# Patient Record
Sex: Female | Born: 1940 | Race: White | Hispanic: No | Marital: Married | Smoking: Never smoker
Health system: Southern US, Community
[De-identification: ages and names within clinical notes are randomized; demographics above are authoritative.]

## PROBLEM LIST (undated history)

## (undated) DIAGNOSIS — I639 Cerebral infarction, unspecified: Secondary | ICD-10-CM

## (undated) DIAGNOSIS — R519 Headache, unspecified: Secondary | ICD-10-CM

## (undated) DIAGNOSIS — I10 Essential (primary) hypertension: Secondary | ICD-10-CM

## (undated) DIAGNOSIS — C801 Malignant (primary) neoplasm, unspecified: Secondary | ICD-10-CM

## (undated) DIAGNOSIS — R569 Unspecified convulsions: Secondary | ICD-10-CM

## (undated) DIAGNOSIS — R51 Headache: Secondary | ICD-10-CM

## (undated) DIAGNOSIS — Z9889 Other specified postprocedural states: Secondary | ICD-10-CM

## (undated) HISTORY — DX: Cerebral infarction, unspecified: I63.9

## (undated) HISTORY — DX: Malignant (primary) neoplasm, unspecified: C80.1

## (undated) HISTORY — DX: Headache, unspecified: R51.9

## (undated) HISTORY — DX: Other specified postprocedural states: Z98.890

## (undated) HISTORY — DX: Headache: R51

## (undated) HISTORY — DX: Unspecified convulsions: R56.9

## (undated) HISTORY — DX: Essential (primary) hypertension: I10

---

## 1960-09-24 HISTORY — PX: OVARIAN CYST SURGERY: SHX726

## 2010-09-24 HISTORY — PX: BREAST SURGERY: SHX581

## 2016-07-05 ENCOUNTER — Encounter: Payer: Self-pay | Admitting: *Deleted

## 2016-07-24 ENCOUNTER — Encounter: Payer: Self-pay | Admitting: Diagnostic Neuroimaging

## 2016-07-24 ENCOUNTER — Ambulatory Visit (INDEPENDENT_AMBULATORY_CARE_PROVIDER_SITE_OTHER): Payer: Medicare Other | Admitting: Diagnostic Neuroimaging

## 2016-07-24 VITALS — BP 153/74 | HR 83 | Ht <= 58 in | Wt 194.0 lb

## 2016-07-24 DIAGNOSIS — R55 Syncope and collapse: Secondary | ICD-10-CM

## 2016-07-24 DIAGNOSIS — R269 Unspecified abnormalities of gait and mobility: Secondary | ICD-10-CM

## 2016-07-24 DIAGNOSIS — G40209 Localization-related (focal) (partial) symptomatic epilepsy and epileptic syndromes with complex partial seizures, not intractable, without status epilepticus: Secondary | ICD-10-CM

## 2016-07-24 DIAGNOSIS — R292 Abnormal reflex: Secondary | ICD-10-CM

## 2016-07-24 NOTE — Patient Instructions (Signed)
Thank you for coming to see [place] at [facility]. I hope we have been able to provide you high quality care today.  You may receive a patient satisfaction survey over the next few weeks. We would appreciate your feedback and comments so that we may continue to improve ourselves and the health of our patients.  - check MRI brain and cervical spine  - check EEG  - continue oxcarbazepine 174m daily + topiramate 261mat bedtime  - refer to cardiology for second opinion evaluation (unprovoked syncope)   ~~~~~~~~~~~~~~~~~~~~~~~~~~~~~~~~~~~~~~~~~~~~~~~~~~~~~~~~~~~~~~~~~  DR. PENUMALLI'S GUIDE TO HAPPY AND HEALTHY LIVING These are some of my general health and wellness recommendations. Some of them may apply to you better than others. Please use common sense as you try these suggestions and feel free to ask me any questions.   ACTIVITY/FITNESS Mental, social, emotional and physical stimulation are very important for brain and body health. Try learning a new activity (arts, music, language, sports, games).  Keep moving your body to the best of your abilities. You can do this at home, inside or outside, the park, community center, gym or anywhere you like. Consider a physical therapist or personal trainer to get started. Consider the app Sworkit. Fitness trackers such as smart-watches, smart-phones or Fitbits can help as well.   NUTRITION Eat more plants: colorful vegetables, nuts, seeds and berries.  Eat less sugar, salt, preservatives and processed foods.  Avoid toxins such as cigarettes and alcohol.  Drink water when you are thirsty. Warm water with a slice of lemon is an excellent morning drink to start the day.  Consider these websites for more information The Nutrition Source (hthttps://www.henry-hernandez.biz/Precision Nutrition (wwWindowBlog.ch  RELAXATION Consider practicing mindfulness meditation or other relaxation  techniques such as deep breathing, prayer, yoga, tai chi, massage. See website mindful.org or the apps Headspace or Calm to help get started.   SLEEP Try to get at least 7-8+ hours sleep per day. Regular exercise and reduced caffeine will help you sleep better. Practice good sleep hygeine techniques. See website sleep.org for more information.   PLANNING Prepare estate planning, living will, healthcare POA documents. Sometimes this is best planned with the help of an attorney. Theconversationproject.org and agingwithdignity.org are excellent resources.

## 2016-07-24 NOTE — Progress Notes (Signed)
[facility]  PATIENT: Savannah Travis DOB: 21-Nov-1940  REFERRING CLINICIAN: Keane Police HISTORY FROM: patient, husband, daughter  REASON FOR VISIT: new consult    HISTORICAL  CHIEF COMPLAINT:  Chief Complaint  Patient presents with  . Syncope and collapse    rm 7, New Pt, husbandJeneen Rinks, dgtr- Cindy, "LOC w/fall Aug, headaches began in 2015 w/fall"    HISTORY OF PRESENT ILLNESS:   75 year old right-handed female here for evaluation of constellation of symptoms including head pain, squeezing pressure headaches, passing out spells. History of migraine, depression, anxiety, hypertension, hypercholesteremia, breast cancer.  In January 2015 patient had episode of falling backwards, hitting her head and passing out. Unclear if patient passed out and fell down or she tripped, fell and then passed out. Patient woke up with pain in the back of her head. She did not seek medical attention.  Since that time patient has had over 50 episodes of unprovoked syncope and passing out. Sometimes these are proceeded by patient making the sound "oh oh". Patient has had episodes of staring spells as well. With some of these episodes patient had witnessed/documented low heart rate in the 40s. Blood pressure during these attacks when it had been checked was normal to slightly elevated.   Patient has had evaluation by PCP, hospital admission in September 2017, cardiology consultation, without specific etiology found. Patient had metoprolol medication at the time which was discontinued as patient has some episodes of bradycardia. Patient had neurology evaluation and has been treated empirically for seizures with oxcarbazepine. Since that time no further syncope or seizure attacks.  In addition patient has intermittent "zinger" pain in her head lasting for 1 second at a time every other day. Sometimes this happens once every 2 weeks. Patient was diagnosed with possible occipital neuralgia,  treated with occipital nerve block without relief. Patient was also tried on gabapentin without relief.  Patient also has intermittent "gripping pressure" squeezing pain and pressure in her head lasting 2-3 minutes at a time, 4 days out of the week, 2-3 times per day.  Patient has history of headaches since teenage years consisting of intense global severe pain associated with menstrual cycle. Sometimes stress or exams would also seem to trigger headaches. Patient took over-the-counter medications. No nausea, vomiting, sensitivity to light or sound.   REVIEW OF SYSTEMS: Full 14 system review of systems performed and negative with exception of:  Shortness of breath incontinence joint pain feeling cold headache weakness passing out sleepiness depression anxiety to much sleep decreased energy disinterest in activities racing thoughts.  ALLERGIES: Allergies  Allergen Reactions  . Demerol [Meperidine] Nausea And Vomiting  . Integra [Fe Fum-Fepoly-Vit C-Vit B3] Diarrhea  . Magnesium Oxide Diarrhea  . Metformin And Related Diarrhea  . Sulfur Swelling    rash    HOME MEDICATIONS: No outpatient prescriptions prior to visit.   No facility-administered medications prior to visit.     PAST MEDICAL HISTORY: Past Medical History:  Diagnosis Date  . Cancer (HCC)    breast  . Headache    migraines  . Hypertension   . S/P wrist surgery 2015, 2017   for fx    PAST SURGICAL HISTORY: Past Surgical History:  Procedure Laterality Date  . BREAST SURGERY Right 2012   lumps removed  . OVARIAN CYST SURGERY  1962    FAMILY HISTORY: Family History  Problem Relation Age of Onset  . Cancer Sister     SOCIAL HISTORY:  Social History   Social History  .  Marital status: Married    Spouse name: Jeneen Rinks  . Number of children: 2  . Years of education: 12   Occupational History  .      [employer], retired   Social History Main Topics  . Smoking status: Never Smoker  . Smokeless  tobacco: Never Used  . Alcohol use No  . Drug use: No  . Sexual activity: Not on file   Other Topics Concern  . Not on file   Social History Narrative   Lives with husband   Caffeine - coffee, 1 cup daily     PHYSICAL EXAM  GENERAL EXAM/CONSTITUTIONAL: Vitals:  Vitals:   07/24/16 1448  BP: (!) 153/74  Pulse: 83  Weight: 194 lb (88 kg)  Height: 4\' 8"  (1.422 m)     Body mass index is 43.49 kg/m.  No exam data present  Patient is in no distress; well developed, nourished and groomed; neck is supple  CARDIOVASCULAR:  Examination of carotid arteries is normal; no carotid bruits  Regular rate and rhythm, no murmurs  Examination of peripheral vascular system by observation and palpation is normal  EYES:  Ophthalmoscopic exam of optic discs and posterior segments is normal; no papilledema or hemorrhages  MUSCULOSKELETAL:  Gait, strength, tone, movements noted in Neurologic exam below  NEUROLOGIC: MENTAL STATUS:  No flowsheet data found.  awake, alert, oriented to person, place and time  recent and remote memory intact  normal attention and concentration  language fluent, comprehension intact, naming intact,   fund of knowledge appropriate  CRANIAL NERVE:   2nd - no papilledema on fundoscopic exam  2nd, 3rd, 4th, 6th - pupils equal and reactive to light, visual fields full to confrontation, extraocular muscles intact, no nystagmus  5th - facial sensation symmetric  7th - facial strength symmetric  8th - hearing intact  9th - palate elevates symmetrically, uvula midline  11th - shoulder shrug symmetric  12th - tongue protrusion midline  MOTOR:   normal bulk and tone, full strength in the BUE, BLE  SENSORY:   normal and symmetric to light touch, temperature, vibration  COORDINATION:   finger-nose-finger, fine finger movements normal  REFLEXES:   deep tendon reflexes BRISK IN UPPER EXT AND KNEES and symmetric  GAIT/STATION:    narrow based gait; SPASTIC GAIT; UNSTEADY     DIAGNOSTIC DATA (LABS, IMAGING, TESTING) - I reviewed patient records, labs, notes, testing and imaging myself where available.  No results found for: WBC, HGB, HCT, MCV, PLT No results found for: NA, K, CL, CO2, GLUCOSE, BUN, CREATININE, CALCIUM, PROT, ALBUMIN, AST, ALT, ALKPHOS, BILITOT, GFRNONAA, GFRAA No results found for: CHOL, HDL, LDLCALC, LDLDIRECT, TRIG, CHOLHDL No results found for: HGBA1C No results found for: VITAMINB12 No results found for: TSH   06/16/14 MRI brain - moderate-severe chronic small vessel ischemic disease - mild perisylvian atrophy - no acute findings  06/05/16 EEG - normal EEG in awake and drowsy states    ASSESSMENT AND PLAN  75 y.o. year old female here with  multiple episodes of recurrent unprovoked passing out attacks (> 50 since 2015) with syncope and seizure features. Considerations would include beta blocker induced bradycardia and syncopal attacks. Other consideration would include complex partial seizures. The multiple attacks, stereotyped, sometimes preceded by a moan or scream, sometimes preceded by staring spells are highly suspicious for complex partial seizures. We'll complete workup with MRI brain and EEG.  Patient's gait and balance difficulties may be due to underlying cervical myelopathy and  we will check MRI cervical spine for further evaluation.  Patient also has long-standing history of probable migraine headaches. 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   Ddx: syncope (cardiogenic, dehydration, UTI) vs seizure (complex partial)  1. Syncope and collapse   2. Partial symptomatic epilepsy with complex partial seizures, not intractable, without status epilepticus (HCC)   3. Gait difficulty   4. Hyperreflexia      PLAN: - check MRI brain and cervical spine - check EEG - continue oxcarbazepine 150mg  daily + topiramate 25mg  at  bedtime - refer to cardiology for second opinion evaluation (unprovoked syncope)  Orders Placed This Encounter  Procedures  . MR BRAIN W WO CONTRAST  . MR CERVICAL SPINE WO CONTRAST  . EEG adult   Return in about 2 months (around 09/23/2016).    Penni Bombard, MD [id number], [id number] PM Certified in Neurology, Neurophysiology and Neuroimaging  [facility] [address], [place], [place] [address] [phone]

## 2016-07-25 ENCOUNTER — Ambulatory Visit (INDEPENDENT_AMBULATORY_CARE_PROVIDER_SITE_OTHER): Payer: Medicare Other | Admitting: Diagnostic Neuroimaging

## 2016-07-25 DIAGNOSIS — G40209 Localization-related (focal) (partial) symptomatic epilepsy and epileptic syndromes with complex partial seizures, not intractable, without status epilepticus: Secondary | ICD-10-CM

## 2016-07-25 DIAGNOSIS — R292 Abnormal reflex: Secondary | ICD-10-CM

## 2016-07-25 DIAGNOSIS — R55 Syncope and collapse: Secondary | ICD-10-CM | POA: Diagnosis not present

## 2016-07-25 DIAGNOSIS — R269 Unspecified abnormalities of gait and mobility: Secondary | ICD-10-CM

## 2016-07-25 NOTE — Procedures (Signed)
   [facility]  EEG (ELECTROENCEPHALOGRAM) REPORT   STUDY DATE: 07/25/16 PATIENT NAME: Savannah Travis DOB: June 03, 1941 MRN: [id number]  ORDERING CLINICIAN: Andrey Spearman, MD   TECHNOLOGIST: Oneita Jolly TECHNIQUE: Electroencephalogram was recorded utilizing standard 10-20 system of lead placement and reformatted into average and bipolar montages.  RECORDING TIME: 22 minutes ACTIVATION: hyperventilation and photic stimulation  CLINICAL INFORMATION: 75 year old female with syncope.  FINDINGS: Background rhythms of 11-12 hertz and 50-60 microvolts. No focal, lateralizing, epileptiform activity or seizures are seen. Patient recorded in the awake and drowsy state. EKG channel shows regular rhythm of 80 beats per minute.  IMPRESSION:  Normal EEG in the awake and drowsy states.     INTERPRETING PHYSICIAN:  Penni Bombard, MD Certified in Neurology, Neurophysiology and Neuroimaging  [facility] [address] [place], [place] [address] [phone]

## 2016-08-02 ENCOUNTER — Encounter: Payer: Self-pay | Admitting: Physician Assistant

## 2016-08-02 ENCOUNTER — Ambulatory Visit (INDEPENDENT_AMBULATORY_CARE_PROVIDER_SITE_OTHER): Payer: Medicare Other | Admitting: Physician Assistant

## 2016-08-02 VITALS — BP 120/80 | HR 90 | Ht <= 58 in | Wt 195.8 lb

## 2016-08-02 DIAGNOSIS — R0602 Shortness of breath: Secondary | ICD-10-CM | POA: Diagnosis not present

## 2016-08-02 DIAGNOSIS — R55 Syncope and collapse: Secondary | ICD-10-CM | POA: Diagnosis not present

## 2016-08-02 DIAGNOSIS — I5032 Chronic diastolic (congestive) heart failure: Secondary | ICD-10-CM

## 2016-08-02 DIAGNOSIS — I10 Essential (primary) hypertension: Secondary | ICD-10-CM | POA: Diagnosis not present

## 2016-08-02 DIAGNOSIS — E785 Hyperlipidemia, unspecified: Secondary | ICD-10-CM

## 2016-08-02 NOTE — Progress Notes (Addendum)
Cardiology Office Note    Date:  08/02/2016   ID:  [name], DOB 02-07-1941, MRN [id number]  PCP:  Keane Police, MD  Cardiologist:  New  Chief Complaint: Syncope  History of Present Illness:   Savannah Travis is a 75 y.o. female with hx of HTN, HLD, breast cancer and recurrent syncope who was recently seen by Dr. Leta Baptist for syncope and sent her for further evaluation.   He has long standing hx of syncope and collapse dating back to 2015 when she fall back ward and hitting her head. Did not seek medical attention. Over 50 episode since then.   Last episode occurred in Sep, 2017. Documented bradycardia to 40s on metoprolol. Echo was normal (no records available). She was evaluated once by Dr. Berline Lopes @ [place], [place] once after dischage. 30 days event monitor showed sinus rhythm. No arrhythmias.  She was also followed by Dr. Alroy Dust in past for high blood pressure. Stress test was normal many years ago.   The patient was recently evaluated by Dr. Leta Baptist 07/24/16. Pending MRI of brain and cervical spine (due to balance difficulty). Has normal EEG 07/25/16.   She is here for further evaluation and wants to establish care here. No reoccurrence of syncope since discontinuation of metoprolol. Her main complain is dyspnea on exertion that has been getting worse long with fatigue and tiredness.  Intermittent LE edema, abdominal tightness, and orthopnea. Denies palpitation, melena, or snoring. No hx of tobacco smoking to alcohol drinking. Family hx high homocystine level however she is tested negative. Father had MI and stroke at age 9.   Past Medical History:  Diagnosis Date  . Cancer (HCC)    breast  . Headache    migraines  . Hypertension   . S/P wrist surgery 2015, 2017   for fx    Past Surgical History:  Procedure Laterality Date  . BREAST SURGERY Right 2012   lumps removed  . OVARIAN CYST SURGERY  1962    Current Medications: Prior to Admission medications     Medication Sig Start Date End Date Taking? Authorizing Provider  allopurinol (ZYLOPRIM) 300 MG tablet 300 mg. 06/25/16   Historical Provider, MD  amLODipine (NORVASC) 5 MG tablet 5 mg. 06/29/16   Historical Provider, MD  aspirin 81 MG chewable tablet Chew by mouth daily.    Historical Provider, MD  atorvastatin (LIPITOR) 40 MG tablet 40 mg. 06/25/16   Historical Provider, MD  butalbital-acetaminophen-caffeine (FIORICET WITH CODEINE) 50-325-40-30 MG capsule Take 1 capsule by mouth every 4 (four) hours as needed for headache.    Historical Provider, MD  Cholecalciferol (VITAMIN D3) 5000 units TABS Take by mouth.    Historical Provider, MD  clonazePAM (KLONOPIN) 1 MG tablet 1 mg. 06/30/16   Historical Provider, MD  co-enzyme Q-10 30 MG capsule Take 30 mg by mouth 3 (three) times daily.    Historical Provider, MD  denosumab (PROLIA) 60 MG/ML SOLN injection Inject 60 mg into the skin every 6 (six) months. Administer in upper arm, thigh, or abdomen    Historical Provider, MD  escitalopram (LEXAPRO) 10 MG tablet 10 mg. 06/25/16   Historical Provider, MD  Glucos-Chond-Hyal Ac-Ca Fructo (MOVE FREE JOINT HEALTH ADVANCE PO) Take by mouth.    Historical Provider, MD  maprotiline (LUDIOMIL) 50 MG tablet Take 50 mg by mouth at bedtime.    Historical Provider, MD  meclizine (ANTIVERT) 12.5 MG tablet 12.5 mg. 05/30/16   Historical Provider, MD  montelukast (SINGULAIR) 10 MG tablet Take 10  mg by mouth at bedtime.    Historical Provider, MD  omeprazole (PRILOSEC) 40 MG capsule Take 40 mg by mouth daily.    Historical Provider, MD  Oxcarbazepine (TRILEPTAL) 300 MG tablet 150 mg. 06/21/16   Historical Provider, MD  Pancrelipase, Lip-Prot-Amyl, 5000 units CPEP Take by mouth.    Historical Provider, MD  topiramate (TOPAMAX) 25 MG tablet 25 mg. 06/25/16   Historical Provider, MD  [place] Name: Calcium 25 mg    Historical Provider, MD  [facility] Name: chlordiazepoxise 5 mg    Historical Provider, MD     Allergies:   Demerol [meperidine]; Integra [fe fum-fepoly-vit c-vit b3]; Magnesium oxide; Metformin and related; and Sulfur   Social History   Social History  . Marital status: Married    Spouse name: Jeneen Rinks  . Number of children: 2  . Years of education: 12   Occupational History  .      [employer], retired   Social History Main Topics  . Smoking status: Never Smoker  . Smokeless tobacco: Never Used  . Alcohol use No  . Drug use: No  . Sexual activity: Not Asked   Other Topics Concern  . None   Social History Narrative   Lives with husband   Caffeine - coffee, 1 cup daily     Family History:  The patient's family history includes Cancer in her sister.   ROS:   Please see the history of present illness.    ROS All other systems reviewed and are negative.   PHYSICAL EXAM:   VS:  BP 120/80 (BP Location: Right Arm, Cuff Size: Large)   Pulse 90   Ht 4\' 8"  (1.422 m)   Wt 195 lb 12.8 oz (88.8 kg)   LMP  (LMP Unknown)   BMI 43.90 kg/m    GEN: Well nourished, well developed, in no acute distress  HEENT: normal  Neck:  + JVD, carotid bruits, or masses Cardiac: RRR; no murmurs, rubs, or gallops. Trace BL LE edema edema  Respiratory:  clear to auscultation bilaterally, normal work of breathing GI: soft, nontender, nondistended, + BS MS: no deformity or atrophy  Skin: warm and dry, no rash Neuro:  Alert and Oriented x 3, Strength and sensation are intact Psych: euthymic mood, full affect  Wt Readings from Last 3 Encounters:  08/02/16 195 lb 12.8 oz (88.8 kg)  07/24/16 194 lb (88 kg)      Studies/Labs Reviewed:   EKG:  EKG is not  ordered today.  EKG 06/19/16 NSR at rate of 84 bpm.   Recent Labs: No results found for requested labs within last 8760 hours.   Lipid Panel No results found for: CHOL, TRIG, HDL, CHOLHDL, VLDL, LDLCALC, LDLDIRECT  Additional studies/ records that were reviewed today include:   Echocardiogram: 06/04/16 at [facility] LV ef of over 60%, grade 2 DD, normal RV function, normal valve function.   Echocardiogram: 02/09/15  at [facility] LV EF of 60-65%, normal DD, mild LA dilation   ASSESSMENT & PLAN:    1. Syncope  - No reoccurrence since stopped BB. Avoid BB as hx of documented bradycardia. 30 monitor was normal. Continue to monitor.   2. Dyspnea on exertion - This has been getting worse. Sign of mild volume overload.  She has been recently tired and fatigue as well. Will get BNP. If normal, will get Lexiscan to r/o ischemic etiology.   3. Chronic  diastolic CHF - Last echo [id number] at [facility] showed LV ef of over 60%, grade 2 DD, normal RV function, normal valve function. As above. She also has mild BL LE edema and she is on amlodipine. Depending on BNP level will determine medical therapy.   4. HTN - R arm 120/80 and L arm 144/80. No chest pain. Will do carotid doppler to r/o subclavian steal and carotid stenosis.   5. HLD - followed by PCP. Continue statin.   Discussed with DOD Dr. Curt Bears and he aggred to plan.   Medication Adjustments/Labs and Tests Ordered: Current medicines are reviewed at length with the patient today.  Concerns regarding medicines are outlined above.  Medication changes, Labs and Tests ordered today are listed in the Patient Instructions below. Patient Instructions  Your physician recommends that you continue on your current medications as directed. Please refer to the Current Medication list given to you today.   Your physician recommends that you return for lab work in:  [place] has requested that you have a carotid duplex. This test is an ultrasound of the carotid arteries in your neck. It looks at blood flow through these arteries that supply the brain with blood. Allow one hour for this exam. There are no restrictions or special instructions.  Your physician recommends that you schedule a  follow-up appointment in:  2 WEEKS  WITH  VIN    Jarrett Soho, PA  08/02/2016 3:31 PM    [address], [place], [place]  [address] Phone: [phone]; Fax: [phone]

## 2016-08-02 NOTE — Patient Instructions (Addendum)
Your physician recommends that you continue on your current medications as directed. Please refer to the Current Medication list given to you today.   Your physician recommends that you return for lab work in:  [place] has requested that you have a carotid duplex. This test is an ultrasound of the carotid arteries in your neck. It looks at blood flow through these arteries that supply the brain with blood. Allow one hour for this exam. There are no restrictions or special instructions.  Your physician recommends that you schedule a follow-up appointment in:  2 WEEKS  WITH  VIN

## 2016-08-03 LAB — BRAIN NATRIURETIC PEPTIDE: BRAIN NATRIURETIC PEPTIDE: 32.4 pg/mL (ref <100)

## 2016-08-07 ENCOUNTER — Telehealth: Payer: Self-pay | Admitting: Physician Assistant

## 2016-08-07 DIAGNOSIS — R0602 Shortness of breath: Secondary | ICD-10-CM

## 2016-08-07 NOTE — Telephone Encounter (Signed)
-----   Message from [place], [place] sent at 08/03/2016  2:00 PM EST ----- BNP normal. Likely her s/s is not from heart failure. Will get Lexiscan.

## 2016-08-07 NOTE — Telephone Encounter (Signed)
Follow Up:; ° ° °Returning your call. °

## 2016-08-07 NOTE — Telephone Encounter (Signed)
Pt aware of her lab rersults She has been advised that we will order lexiscan and someone will call her with the appt.  Pt was verbally given instructions over the phone.  Pt agreeable with this and verbalized understanding.

## 2016-08-08 ENCOUNTER — Ambulatory Visit
Admission: RE | Admit: 2016-08-08 | Discharge: 2016-08-08 | Disposition: A | Discharge status: Home / Self Care | Payer: Medicare Other | Source: Ambulatory Visit | Attending: Diagnostic Neuroimaging | Admitting: Diagnostic Neuroimaging

## 2016-08-08 ENCOUNTER — Ambulatory Visit (HOSPITAL_COMMUNITY)
Admission: RE | Admit: 2016-08-08 | Discharge: 2016-08-08 | Disposition: A | Discharge status: Home / Self Care | Payer: Medicare Other | Source: Ambulatory Visit | Attending: Cardiology | Admitting: Cardiology

## 2016-08-08 DIAGNOSIS — G40209 Localization-related (focal) (partial) symptomatic epilepsy and epileptic syndromes with complex partial seizures, not intractable, without status epilepticus: Secondary | ICD-10-CM

## 2016-08-08 DIAGNOSIS — R292 Abnormal reflex: Secondary | ICD-10-CM

## 2016-08-08 DIAGNOSIS — R55 Syncope and collapse: Secondary | ICD-10-CM

## 2016-08-08 DIAGNOSIS — R269 Unspecified abnormalities of gait and mobility: Secondary | ICD-10-CM

## 2016-08-08 MED ORDER — GADOBENATE DIMEGLUMINE 529 MG/ML IV SOLN
20.0000 mL | Freq: Once | INTRAVENOUS | Status: AC | PRN
  Administered 2016-08-08: 18 mL via INTRAVENOUS

## 2016-08-10 ENCOUNTER — Telehealth: Payer: Self-pay | Admitting: Physician Assistant

## 2016-08-10 NOTE — Telephone Encounter (Signed)
Returned pts call and discussed her Vas US Carotid results.  Pt verbalized understanding.

## 2016-08-10 NOTE — Telephone Encounter (Signed)
Returned pts call..lmptcb jw 08-10-16

## 2016-08-10 NOTE — Telephone Encounter (Signed)
Follow up    Pt verbalized that he is returning call

## 2016-08-10 NOTE — Telephone Encounter (Signed)
Follow Up:; ° ° °Returning your call. °

## 2016-08-23 ENCOUNTER — Ambulatory Visit: Payer: Medicare Other | Admitting: Physician Assistant

## 2016-08-24 DIAGNOSIS — I639 Cerebral infarction, unspecified: Secondary | ICD-10-CM

## 2016-08-24 HISTORY — DX: Cerebral infarction, unspecified: I63.9

## 2016-08-29 ENCOUNTER — Telehealth: Payer: Self-pay | Admitting: *Deleted

## 2016-08-29 NOTE — Telephone Encounter (Signed)
Spoke with patient and informed her that this RN needs to reschedule her follow up due to provider being out of the office. Rescheduled for 10/15/16, advised she arrive 15 min early. She verbalized understanding.

## 2016-08-30 NOTE — Progress Notes (Deleted)
Cardiology Office Note    Date:  08/30/2016   ID:  Savannah Travis, DOB 1941/03/20, MRN [id number]  PCP:  Keane Police, MD  Cardiologist:  Dr. Curt Bears or Dr.End   Chief Complaint: syncope follow up  History of Present Illness:   Savannah Travis is a 75 y.o. female with hx of HTN, HLD, breast cancer and recurrent syncope (resolved since off BB) who presented for follow up.   He has long standing hx of syncope and collapse dating back to 2015 when she fall back ward and hitting her head. Last episode occurred in Sep, 2017. Documented bradycardia to 40s on metoprolol. No reoccurrence since discontinued.   She was evaluated once by Dr. Berline Lopes @ [place], [place] once sept-oct 2017. 30 days event monitor showed sinus rhythm. No arrhythmias.  She was also followed by Dr. Alroy Dust in past for high blood pressure.   Seen by me and Dr. Curt Bears (DOD) 08/02/16 after referral by by Dr. Leta Baptist. She also complains of dyspnea on exertion and LE edema. BNP was normal. Still pending stress test. Normal carotid doppler.   Here today for follow up.     Past Medical History:  Diagnosis Date  . Cancer (HCC)    breast  . Headache    migraines  . Hypertension   . S/P wrist surgery 2015, 2017   for fx    Past Surgical History:  Procedure Laterality Date  . BREAST SURGERY Right 2012   lumps removed  . OVARIAN CYST SURGERY  1962    Current Medications: Prior to Admission medications   Medication Sig Start Date End Date Taking? Authorizing Provider  allopurinol (ZYLOPRIM) 300 MG tablet Take 300 mg by mouth daily.  06/25/16   Historical Provider, MD  amLODipine (NORVASC) 5 MG tablet Take 5 mg by mouth daily.  06/29/16   Historical Provider, MD  aspirin 81 MG chewable tablet Chew 81 mg by mouth daily.     Historical Provider, MD  atorvastatin (LIPITOR) 40 MG tablet Take 40 mg by mouth daily at 6 PM.  06/25/16   Historical Provider, MD  butalbital-acetaminophen-caffeine (FIORICET WITH CODEINE)  50-325-40-30 MG capsule Take 1 capsule by mouth every 4 (four) hours as needed for headache.    Historical Provider, MD  Cholecalciferol (VITAMIN D3) 5000 units TABS Take 1 tablet by mouth daily.     Historical Provider, MD  clonazePAM (KLONOPIN) 1 MG tablet Take 1 mg by mouth 2 (two) times daily as needed.  06/30/16   Historical Provider, MD  co-enzyme Q-10 30 MG capsule Take 30 mg by mouth 3 (three) times daily.    Historical Provider, MD  denosumab (PROLIA) 60 MG/ML SOLN injection Inject 60 mg into the skin every 6 (six) months. Administer in upper arm, thigh, or abdomen    Historical Provider, MD  escitalopram (LEXAPRO) 10 MG tablet Take 10 mg by mouth daily.  06/25/16   Historical Provider, MD  Glucos-Chond-Hyal Ac-Ca Fructo (MOVE FREE JOINT HEALTH ADVANCE PO) Take 1 tablet by mouth daily.     Historical Provider, MD  maprotiline (LUDIOMIL) 50 MG tablet Take 50 mg by mouth at bedtime.    Historical Provider, MD  meclizine (ANTIVERT) 12.5 MG tablet Take 12.5 mg by mouth 2 (two) times daily as needed.  05/30/16   Historical Provider, MD  montelukast (SINGULAIR) 10 MG tablet Take 10 mg by mouth at bedtime.    Historical Provider, MD  MYRBETRIQ 50 MG TB24 tablet Take 50 mg by mouth daily. 07/31/16  Historical Provider, MD  omeprazole (PRILOSEC) 40 MG capsule Take 40 mg by mouth daily.    Historical Provider, MD  Oxcarbazepine (TRILEPTAL) 300 MG tablet Take 150 mg by mouth daily.  06/21/16   Historical Provider, MD  Pancrelipase, Lip-Prot-Amyl, 5000 units CPEP Take by mouth.    Historical Provider, MD  topiramate (TOPAMAX) 25 MG tablet Take 25 mg by mouth daily.  06/25/16   Historical Provider, MD  UNABLE TO FIND Med Name: Calcium 25 mg    Historical Provider, MD  UNABLE TO FIND Med Name: chlordiazepoxise 5 mg    Historical Provider, MD    Allergies:   Demerol [meperidine]; Integra [fe fum-fepoly-vit c-vit b3]; Magnesium oxide; Metformin and related; and Sulfur   Social History   Social History  .  Marital status: Married    Spouse name: Jeneen Rinks  . Number of children: 2  . Years of education: 12   Occupational History  .      [employer], retired   Social History Main Topics  . Smoking status: Never Smoker  . Smokeless tobacco: Never Used  . Alcohol use No  . Drug use: No  . Sexual activity: Not on file   Other Topics Concern  . Not on file   Social History Narrative   Lives with husband   Caffeine - coffee, 1 cup daily     Family History:  The patient's family history includes Cancer in her sister. ***  ROS:   Please see the history of present illness.    ROS All other systems reviewed and are negative.   PHYSICAL EXAM:   VS:  LMP  (LMP Unknown)    GEN: Well nourished, well developed, in no acute distress  HEENT: normal  Neck: no JVD, carotid bruits, or masses Cardiac: ***RRR; no murmurs, rubs, or gallops,no edema  Respiratory:  clear to auscultation bilaterally, normal work of breathing GI: soft, nontender, nondistended, + BS MS: no deformity or atrophy  Skin: warm and dry, no rash Neuro:  Alert and Oriented x 3, Strength and sensation are intact Psych: euthymic mood, full affect  Wt Readings from Last 3 Encounters:  08/02/16 195 lb 12.8 oz (88.8 kg)  07/24/16 194 lb (88 kg)      Studies/Labs Reviewed:   EKG:  EKG is ordered today.  The ekg ordered today demonstrates ***  Recent Labs: 08/02/2016: Brain Natriuretic Peptide 32.4   Lipid Panel No results found for: CHOL, TRIG, HDL, CHOLHDL, VLDL, LDLCALC, LDLDIRECT  Additional studies/ records that were reviewed today include:   Echocardiogram: 06/04/16 at [facility] LV ef of over 60%, grade 2 DD, normal RV function, normal valve function.   Echocardiogram: 02/09/15  at [facility] LV EF of 60-65%, normal DD, mild LA dilation    ASSESSMENT & PLAN:    1. ***    Medication Adjustments/Labs and Tests Ordered: Current medicines are reviewed at  length with the patient today.  Concerns regarding medicines are outlined above.  Medication changes, Labs and Tests ordered today are listed in the Patient Instructions below. There are no Patient Instructions on file for this visit.   Jarrett Soho, [place]  08/30/2016 11:54 AM    [facility] [place], [place], [place]  [address] Phone: [phone]; Fax: [phone]

## 2016-09-05 ENCOUNTER — Ambulatory Visit: Payer: Medicare Other | Admitting: Physician Assistant

## 2016-09-11 ENCOUNTER — Encounter (HOSPITAL_COMMUNITY): Payer: Self-pay | Admitting: *Deleted

## 2016-09-11 ENCOUNTER — Emergency Department (HOSPITAL_COMMUNITY)
Admission: EM | Admit: 2016-09-11 | Discharge: 2016-09-11 | Disposition: A | Discharge status: Home / Self Care | Payer: Medicare Other | Attending: Emergency Medicine | Admitting: Emergency Medicine

## 2016-09-11 ENCOUNTER — Emergency Department (HOSPITAL_COMMUNITY): Payer: Medicare Other

## 2016-09-11 ENCOUNTER — Telehealth (HOSPITAL_COMMUNITY): Payer: Self-pay | Admitting: *Deleted

## 2016-09-11 DIAGNOSIS — Z79899 Other long term (current) drug therapy: Secondary | ICD-10-CM | POA: Diagnosis not present

## 2016-09-11 DIAGNOSIS — R269 Unspecified abnormalities of gait and mobility: Secondary | ICD-10-CM | POA: Insufficient documentation

## 2016-09-11 DIAGNOSIS — Z7982 Long term (current) use of aspirin: Secondary | ICD-10-CM | POA: Diagnosis not present

## 2016-09-11 DIAGNOSIS — Z853 Personal history of malignant neoplasm of breast: Secondary | ICD-10-CM | POA: Diagnosis not present

## 2016-09-11 DIAGNOSIS — I10 Essential (primary) hypertension: Secondary | ICD-10-CM | POA: Insufficient documentation

## 2016-09-11 DIAGNOSIS — R531 Weakness: Secondary | ICD-10-CM

## 2016-09-11 LAB — URINALYSIS, ROUTINE W REFLEX MICROSCOPIC
Bilirubin Urine: NEGATIVE
GLUCOSE, UA: NEGATIVE mg/dL
HGB URINE DIPSTICK: NEGATIVE
KETONES UR: NEGATIVE mg/dL
Leukocytes, UA: NEGATIVE
Nitrite: NEGATIVE
PROTEIN: NEGATIVE mg/dL
Specific Gravity, Urine: 1.009 (ref 1.005–1.030)
pH: 6 (ref 5.0–8.0)

## 2016-09-11 LAB — CBC WITH DIFFERENTIAL/PLATELET
Basophils Absolute: 0 10*3/uL (ref 0.0–0.1)
Basophils Relative: 1 %
EOS PCT: 3 %
Eosinophils Absolute: 0.2 10*3/uL (ref 0.0–0.7)
HCT: 37.3 % (ref 36.0–46.0)
Hemoglobin: 12 g/dL (ref 12.0–15.0)
LYMPHS ABS: 1.8 10*3/uL (ref 0.7–4.0)
LYMPHS PCT: 28 %
MCH: 28.4 pg (ref 26.0–34.0)
MCHC: 32.2 g/dL (ref 30.0–36.0)
MCV: 88.2 fL (ref 78.0–100.0)
MONO ABS: 0.4 10*3/uL (ref 0.1–1.0)
Monocytes Relative: 6 %
Neutro Abs: 4.1 10*3/uL (ref 1.7–7.7)
Neutrophils Relative %: 62 %
PLATELETS: 277 10*3/uL (ref 150–400)
RBC: 4.23 MIL/uL (ref 3.87–5.11)
RDW: 14.7 % (ref 11.5–15.5)
WBC: 6.5 10*3/uL (ref 4.0–10.5)

## 2016-09-11 LAB — I-STAT TROPONIN, ED
Troponin i, poc: 0.01 ng/mL (ref 0.00–0.08)
Troponin i, poc: 0.01 ng/mL (ref 0.00–0.08)

## 2016-09-11 LAB — BASIC METABOLIC PANEL
Anion gap: 7 (ref 5–15)
BUN: 10 mg/dL (ref 6–20)
CALCIUM: 9.1 mg/dL (ref 8.9–10.3)
CO2: 21 mmol/L — ABNORMAL LOW (ref 22–32)
CREATININE: 0.99 mg/dL (ref 0.44–1.00)
Chloride: 113 mmol/L — ABNORMAL HIGH (ref 101–111)
GFR calc Af Amer: >60 mL/min (ref >60)
GFR, EST NON AFRICAN AMERICAN: 54 mL/min — AB (ref >60)
GLUCOSE: 99 mg/dL (ref 65–99)
Potassium: 3.1 mmol/L — ABNORMAL LOW (ref 3.5–5.1)
Sodium: 141 mmol/L (ref 135–145)

## 2016-09-11 MED ORDER — POTASSIUM CHLORIDE CRYS ER 20 MEQ PO TBCR
40.0000 meq | EXTENDED_RELEASE_TABLET | Freq: Once | ORAL | Status: AC
  Administered 2016-09-11: 40 meq via ORAL
  Filled 2016-09-11: qty 2

## 2016-09-11 NOTE — ED Notes (Signed)
Patient returned placed back on monitor. Family at bedside.

## 2016-09-11 NOTE — ED Notes (Signed)
Patient attempting to given urine sample.

## 2016-09-11 NOTE — ED Provider Notes (Signed)
[facility] DEPT Provider Note   CSN: [id number] Arrival date & time: 09/11/16  1009   History   Chief Complaint Chief Complaint  Patient presents with  . Gait Problem    HPI Savannah Travis is a 75 y.o. female.  Patient with PMH of multiple falls and syncopal episodes, HTN, HLD, and h/o breast cancer with generalized weakness that started 5 days prior to ED visit. Patient has history of fall with head injury in 2015 and has had >50 episodes of syncope and falls since then, but patient has apparently been improving in her mobility and functionality up until 5 days ago per family. Family describes her as being in a fog with less interaction unless prompted and slowed movements. She has had 2 soft falls into chair in last 24 hours due to weakness - patient denies associated chest pain, diaphoresis, headache, shortness of breath or other changes preceding the falls. She does endorse a central and left sided chest pain that does not radiate and otherwise patient cannot qualify the pain. She endorses a right sided headache that has been present for about one day. She denies focal weakness, numbness, tingling, vision or hearing changes.  She has been evaluated by neurology outpatient who think patient may be having seizures 2/2 trauma from prior fall and head injury. 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 Patient is also being workup up by cardiology outpatient; she was discontinued from metoprolol due to bradycardia; 30-day event monitor did not pick up arrhythmia and she has a stress test scheduled for 09/14/2016.      Past Medical History:  Diagnosis Date  . Cancer (HCC)    breast  . Headache    migraines  . Hypertension   . S/P wrist surgery 2015, 2017   for fx    There are no active problems to display for this patient.   Past Surgical History:  Procedure Laterality Date  .  BREAST SURGERY Right 2012   lumps removed  . OVARIAN CYST SURGERY  1962    OB History    No data available       Home Medications    Prior to Admission medications   Medication Sig Start Date End Date Taking? Authorizing Provider  allopurinol (ZYLOPRIM) 300 MG tablet Take 300 mg by mouth daily.  06/25/16  Yes Historical Provider, MD  amLODipine (NORVASC) 5 MG tablet Take 5 mg by mouth daily.  06/29/16  Yes Historical Provider, MD  aspirin 81 MG chewable tablet Chew 81 mg by mouth daily.    Yes Historical Provider, MD  atorvastatin (LIPITOR) 40 MG tablet Take 40 mg by mouth daily at 6 PM.  06/25/16  Yes Historical Provider, MD  calcium-vitamin D (CALCIUM 500/D) 500-200 MG-UNIT tablet Take 1 tablet by mouth daily with breakfast.   Yes Historical Provider, MD  Cholecalciferol (VITAMIN D3) 5000 units TABS Take 1 tablet by mouth daily.    Yes Historical Provider, MD  clidinium-chlordiazePOXIDE (LIBRAX) 5-2.5 MG capsule Take 1 capsule by mouth 4 (four) times daily -  before meals and at bedtime.   Yes Historical Provider, MD  clonazePAM (KLONOPIN) 1 MG tablet Take 1 mg by mouth 2 (two) times daily as needed.  06/30/16  Yes Historical Provider, MD  co-enzyme Q-10 30 MG capsule Take 30 mg by mouth 3 (three) times daily.   Yes Historical Provider, MD  escitalopram (LEXAPRO) 10 MG tablet  Take 10 mg by mouth daily.  06/25/16  Yes Historical Provider, MD  Glucos-Chond-Hyal Ac-Ca Fructo (MOVE FREE JOINT HEALTH ADVANCE PO) Take 1 tablet by mouth daily.    Yes Historical Provider, MD  maprotiline (LUDIOMIL) 50 MG tablet Take 50 mg by mouth at bedtime.   Yes Historical Provider, MD  meclizine (ANTIVERT) 12.5 MG tablet Take 12.5 mg by mouth 2 (two) times daily as needed.  05/30/16  Yes Historical Provider, MD  montelukast (SINGULAIR) 10 MG tablet Take 10 mg by mouth at bedtime.   Yes Historical Provider, MD  MYRBETRIQ 50 MG TB24 tablet Take 50 mg by mouth daily. 07/31/16  Yes Historical Provider, MD  omeprazole  (PRILOSEC) 40 MG capsule Take 40 mg by mouth daily.   Yes Historical Provider, MD  Oxcarbazepine (TRILEPTAL) 300 MG tablet Take 150 mg by mouth daily.  06/21/16  Yes Historical Provider, MD  butalbital-acetaminophen-caffeine (FIORICET WITH CODEINE) 50-325-40-30 MG capsule Take 1 capsule by mouth every 4 (four) hours as needed for headache.    Historical Provider, MD  denosumab (PROLIA) 60 MG/ML SOLN injection Inject 60 mg into the skin every 6 (six) months. Administer in upper arm, thigh, or abdomen    Historical Provider, MD  Pancrelipase, Lip-Prot-Amyl, 5000 units CPEP Take by mouth.    Historical Provider, MD  topiramate (TOPAMAX) 25 MG tablet Take 25 mg by mouth daily.  06/25/16   Historical Provider, MD    Family History Family History  Problem Relation Age of Onset  . Cancer Sister     Social History Social History  Substance Use Topics  . Smoking status: Never Smoker  . Smokeless tobacco: Never Used  . Alcohol use No     Allergies   Demerol [meperidine]; Integra [fe fum-fepoly-vit c-vit b3]; Magnesium oxide; Metformin and related; and Sulfur   Review of Systems Review of Systems  Constitutional: Positive for activity change and appetite change (but does take in PO when prompted). Negative for chills, diaphoresis and fever.  HENT: Negative for congestion, hearing loss and rhinorrhea.   Eyes: Negative for photophobia and visual disturbance.  Respiratory: Positive for cough (chronic and unchanged). Negative for chest tightness, shortness of breath and wheezing.   Cardiovascular: Positive for chest pain (mid and left sided). Negative for palpitations and leg swelling.  Gastrointestinal: Negative for abdominal distention, abdominal pain, blood in stool, constipation, diarrhea, nausea and vomiting.  Endocrine: Negative for polyuria.  Genitourinary: Positive for dysuria (intermittent). Negative for difficulty urinating, flank pain, frequency, hematuria and urgency.    Musculoskeletal: Positive for gait problem (feels legs can't bear her weight) and joint swelling (left knee). Negative for arthralgias.  Skin: Negative for rash and wound.  Neurological: Positive for headaches (right sided). Negative for dizziness, tremors, facial asymmetry, weakness, light-headedness and numbness.  Psychiatric/Behavioral: Positive for decreased concentration. Negative for agitation, behavioral problems and confusion. The patient is not nervous/anxious.      Physical Exam Updated Vital Signs BP 144/65   Pulse 83   Temp 98 F (36.7 C) (Oral)   Resp 16   LMP  (LMP Unknown)   SpO2 96%   Physical Exam  Constitutional: She is oriented to person, place, and time. She appears well-developed and well-nourished. No distress.  HENT:  Head: Normocephalic and atraumatic.  Mouth/Throat: Oropharynx is clear and moist.  Eyes: Conjunctivae and EOM are normal. Pupils are equal, round, and reactive to light. No scleral icterus.  Neck: Normal range of motion. Neck supple.  Cardiovascular: Normal rate, regular rhythm  and intact distal pulses.  Exam reveals no gallop and no friction rub.   No murmur heard. Pulmonary/Chest: Effort normal and breath sounds normal. No respiratory distress. She has no wheezes. She has no rales. She exhibits no tenderness.  Abdominal: Soft. Bowel sounds are normal. She exhibits no distension and no mass. There is no tenderness. There is no guarding.  Musculoskeletal: Normal range of motion. She exhibits tenderness (over left knee without appreciable effusion or focal tenderness). She exhibits no deformity. Edema: left knee swelling without erythema; strength and movement intact; tender to palpation both anterior and posterior knee.  Neurological: She is alert and oriented to person, place, and time. She has normal strength. She displays no tremor and normal reflexes. No cranial nerve deficit or sensory deficit. GCS eye subscore is 4. GCS verbal subscore is 5.  GCS motor subscore is 6.  Strength and movement intact however slow response  Skin: Skin is warm and dry. Capillary refill takes less than 2 seconds. No rash noted. She is not diaphoretic. No erythema.  Psychiatric: She has a normal mood and affect. Her behavior is normal. Judgment and thought content normal. Her speech is delayed. Cognition and memory are normal.    ED Treatments / Results  Labs (all labs ordered are listed, but only abnormal results are displayed) Labs Reviewed  BASIC METABOLIC PANEL - Abnormal; Notable for the following:       Result Value   Potassium 3.1 (*)    Chloride 113 (*)    CO2 21 (*)    GFR calc non Af Amer 54 (*)    All other components within normal limits  URINALYSIS, ROUTINE W REFLEX MICROSCOPIC - Abnormal; Notable for the following:    Color, Urine STRAW (*)    All other components within normal limits  CBC WITH DIFFERENTIAL/PLATELET  I-STAT TROPOININ, ED  I-STAT TROPOININ, ED    EKG  EKG Interpretation None       Radiology Dg Chest 1 View  Result Date: 09/11/2016 CLINICAL DATA:  Weakness, "in a fog", no energy, and sleeping more x Friday EXAM: CHEST 1 VIEW COMPARISON:  None. FINDINGS: Normal mediastinum and cardiac silhouette. Normal pulmonary vasculature. No evidence of effusion, infiltrate, or pneumothorax. No acute bony abnormality. IMPRESSION: No acute cardiopulmonary process. Electronically Signed   By: Suzy Bouchard M.D.   On: 09/11/2016 12:09   Ct Head Wo Contrast  Result Date: 09/11/2016 CLINICAL DATA:  Weakness in the legs, status post fall EXAM: CT HEAD WITHOUT CONTRAST TECHNIQUE: Contiguous axial images were obtained from the base of the skull through the vertex without intravenous contrast. COMPARISON:  None. FINDINGS: Brain: No evidence of acute infarction, hemorrhage, extra-axial collection, ventriculomegaly, or mass effect. Generalized cerebral atrophy. Periventricular white matter low attenuation likely secondary to  microangiopathy. Vascular: Cerebrovascular atherosclerotic calcifications are noted. Skull: Negative for fracture or focal lesion. Sinuses/Orbits: Visualized portions of the orbits are unremarkable. Visualized portions of the paranasal sinuses and mastoid air cells are unremarkable. Other: None. IMPRESSION: No acute intracranial pathology. Electronically Signed   By: Kathreen Devoid   On: 09/11/2016 12:19   Dg Knee Complete 4 Views Left  Result Date: 09/11/2016 CLINICAL DATA:  Left anterior knee pain this morning. EXAM: LEFT KNEE - COMPLETE 4+ VIEW COMPARISON:  None. FINDINGS: No acute fracture dislocation. Generalized osteopenia. Tricompartmental osteoarthritis of the left knee. No significant joint effusion. IMPRESSION: No acute osseous injury of the left knee. Electronically Signed   By: Kathreen Devoid   On: 09/11/2016  12:07    Procedures Procedures (including critical care time)  Medications Ordered in ED Medications  potassium chloride SA (K-DUR,KLOR-CON) CR tablet 40 mEq (40 mEq Oral Given 09/11/16 1413)    Initial Impression / Assessment and Plan / ED Course  I have reviewed the triage vital signs and the nursing notes.  Pertinent labs & imaging results that were available during my care of the patient were reviewed by me and considered in my medical decision making (see chart for details).  Clinical Course    Ms. Feo is a 75yo female with PMH of multiple syncopal episodes and falls, presumed seizures, HTN, HLD presenting with weakness x5 days. On arrival, patient is afebrile, normotensive, normocardic, and saturating well on room air. Neuro exam is only positive for delayed movements and speech, otherwise unremarkable. Respiratory, cardiac and abdominal exams are unremarkable. Patient exhibits reproducible chest pain and left knee tenderness. CBC did not reveal a leukocytosis and she had stable hemoglobin. BMP was only remarkable for decreased K to 3.1, otherwise electrolytes and kidney  function was baseline. UA did not reveal evidence of infection. CXR was unremarkable for acute cardiopulmonary process; knee xrays were negative for fracture, effusion or dislocation; CT head wo contrast did not show an acute intracranial pathology. EKG showed NSR and troponins were negative x2. No acute cause of weakness was found through exam or workup. Patient could have had another seizure with residual generalized weakness.   Patient with good follow up with PCP, cardiology and neurology and was prompted to follow up with them for continuing workup of this chronic recurrences of falls, weakness and syncope. Patient lives in [place] so we are unable to arrange home health services for patient.  Patient was administered 60mEq Kdur PO for potassium replenishment.   Patient and family agreeable to discharge with close follow up with PCP, cards and neuro as discussed and planned. Patient remained stable throughout ED stay and had normal vital signs at discharge. Patient and family with no further questions or concerns at this time.   Final Clinical Impressions(s) / ED Diagnoses   Final diagnoses:  Gait abnormality    New Prescriptions Discharge Medication List as of 09/11/2016  3:54 PM       Alphonzo Grieve, MD 09/11/16 [place], MD 09/11/16 1723

## 2016-09-11 NOTE — ED Notes (Signed)
PA at bedside.

## 2016-09-11 NOTE — ED Notes (Signed)
Got patient into a gown and on the monitor 

## 2016-09-11 NOTE — Discharge Instructions (Signed)
Today you were evaluated for weakness. You blood work showed a low potassium which we gave you a pill for, otherwise your kidney function and blood counts were unchanged from previous. Your urine did not show an infection either. Your head imaging (CT) was negative for a bleed, mass or any changes from previous. You knee imaging did not show a break or dislocation.   Please follow up with your PCP, neurologist and cardiologist to continue the workup they've been doing.

## 2016-09-11 NOTE — ED Notes (Signed)
Patient presents to ed family states patient started with unsteady gait Fri. Daughter states patients primary doctor is in [place], she was changed from Topiramate to Actazolamide on 12/11. Also norvasc was changed to Losartan 12/11. Daughter states patient acted like she was in a "fog", no energy sleeping more , so daughter stopped the Actazolamide and Losartan on 12/16 and switched her back to her original medications. States she has fallen  Twice in the last 24 hours. Normally walks with a cane however can't do that now. States she is scheduled for a chemcial stress test on Fri. Has had mutliple CT Scans MRI's ,EEG and EKGs .

## 2016-09-11 NOTE — Telephone Encounter (Signed)
Left message on voicemail per DPR in reference to upcoming appointment scheduled on 09/14/16 at 0915 with detailed instructions given per Myocardial Perfusion Study Information Sheet for the test. LM to arrive 15 minutes early, and that it is imperative to arrive on time for appointment to keep from having the test rescheduled. If you need to cancel or reschedule your appointment, please call the office within 24 hours of your appointment. Failure to do so may result in a cancellation of your appointment, and a $50 no show fee. Phone number given for call back for any questions.

## 2016-09-11 NOTE — ED Notes (Signed)
Admitting at bedside 

## 2016-09-14 ENCOUNTER — Ambulatory Visit (INDEPENDENT_AMBULATORY_CARE_PROVIDER_SITE_OTHER): Payer: Medicare Other | Admitting: Physician Assistant

## 2016-09-14 ENCOUNTER — Ambulatory Visit (HOSPITAL_COMMUNITY): Payer: Medicare Other | Attending: Cardiology

## 2016-09-14 ENCOUNTER — Encounter: Payer: Self-pay | Admitting: Physician Assistant

## 2016-09-14 VITALS — Ht 60.0 in | Wt 195.0 lb

## 2016-09-14 VITALS — BP 132/68 | HR 80 | Ht 60.0 in | Wt 193.0 lb

## 2016-09-14 DIAGNOSIS — R5383 Other fatigue: Secondary | ICD-10-CM | POA: Insufficient documentation

## 2016-09-14 DIAGNOSIS — I10 Essential (primary) hypertension: Secondary | ICD-10-CM

## 2016-09-14 DIAGNOSIS — R110 Nausea: Secondary | ICD-10-CM

## 2016-09-14 DIAGNOSIS — E785 Hyperlipidemia, unspecified: Secondary | ICD-10-CM | POA: Diagnosis not present

## 2016-09-14 DIAGNOSIS — R0600 Dyspnea, unspecified: Secondary | ICD-10-CM | POA: Diagnosis present

## 2016-09-14 DIAGNOSIS — R269 Unspecified abnormalities of gait and mobility: Secondary | ICD-10-CM

## 2016-09-14 DIAGNOSIS — R0602 Shortness of breath: Secondary | ICD-10-CM | POA: Diagnosis present

## 2016-09-14 DIAGNOSIS — I5032 Chronic diastolic (congestive) heart failure: Secondary | ICD-10-CM | POA: Diagnosis not present

## 2016-09-14 DIAGNOSIS — R55 Syncope and collapse: Secondary | ICD-10-CM

## 2016-09-14 DIAGNOSIS — G4489 Other headache syndrome: Secondary | ICD-10-CM

## 2016-09-14 MED ORDER — AMINOPHYLLINE 25 MG/ML IV SOLN
150.0000 mg | Freq: Once | INTRAVENOUS | Status: AC
Start: 2016-09-14 — End: 2016-09-14
  Administered 2016-09-14: 150 mg via INTRAVENOUS

## 2016-09-14 MED ORDER — REGADENOSON 0.4 MG/5ML IV SOLN
0.4000 mg | Freq: Once | INTRAVENOUS | Status: AC
  Administered 2016-09-14: 0.4 mg via INTRAVENOUS

## 2016-09-14 MED ORDER — TECHNETIUM TC 99M TETROFOSMIN IV KIT
33.0000 | PACK | Freq: Once | INTRAVENOUS | Status: AC | PRN
  Administered 2016-09-14: 33 via INTRAVENOUS
  Filled 2016-09-14: qty 33

## 2016-09-14 NOTE — Patient Instructions (Signed)
Medication Instructions:   Your physician recommends that you continue on your current medications as directed. Please refer to the Current Medication list given to you today.    If you need a refill on your cardiac medications before your next appointment, please call your pharmacy.  Labwork: NONE ORDERED  TODAY    Testing/Procedures: NONE ORDERED  TODAY    Follow-Up: UNTIL STRESS TEST RESULTS..... CONTACT [facility] [phone] AS NEEDED FOR  ANY CARDIAC RELATED SYMPTOMS   Any Other Special Instructions Will Be Listed Below (If Applicable).

## 2016-09-14 NOTE — Progress Notes (Signed)
Cardiology Office Note    Date:  09/14/2016   ID:  [name], DOB [birth year], MRN [id number]  PCP:  Keane Police, MD   Cardiologist: Dr. Curt Bears or Dr.End   Chief Complaint: Syncope follow up   History of Present Illness:    Savannah Travis is a 75 y.o. female with hx of HTN, HLD, breast cancer and recurrent syncope (resolved since off BB) who presented for follow up.   He has long standing hx of syncope and collapse dating back to 2015 when she fall back ward and hitting her head. Last episode occurred in Sep, 2017. Documented bradycardia to 40s on metoprolol. No reoccurrence since discontinued.   She was evaluated once by Dr. Berline Lopes @ [place], [place] sept-oct 2017. 30 days event monitor showed sinus rhythm. No arrhythmias. She was also followed by Dr. Alroy Dust in past for high blood pressure.   The patient was evaluated by Dr. Leta Baptist 07/24/16. Her syncope episode felt due to either bradycardia vs complex partial seizure. Gait and balance difficulty may be cervical myelopathy. EEG was normal. Brain MRI without acute findings. 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   Seen by me and Dr. Curt Bears (DOD) 08/02/16 after referral by Dr. Leta Baptist. She also complains of dyspnea on exertion and LE edema. BNP was normal. Still pending stress test. Normal carotid doppler.   The patient seen in ER 09/11/16 for weakness and gait problem x5 days. Neuro exam is only positive for delayed movements and speech, otherwise unremarkable. Exam is also positive for reproducible chest pain and left knee tenderness. CT head wo contrast did not show an acute intracranial pathology. EKG showed NSR and troponins were negative x2. The patent could have another episode of seizure. Discharged home with outpatient follow up.   Here today for follow up. Completed day 1 of two day Myoivew today. Here with daughter and husband. Pt had  medication change from Amlodipine to losartan and topiramate to acetazolamide for headache by PCP. On 09/07/16 patient work up gait abnormality and weakness. No fever or chills.  She switched back again to Amlodipine and Topiramate by PCP on 16th. Had fall on 17th and 19th due to balance issue. Evaluated in ER as above.  She continues to have gait problem with unable to comb her hair or lift her both arm. Had sever headache intermittently with lightening stuck in her head radiating down to her face, upper-mid chest and then goes to her back. Intermittent dizziness. Denies any chest pain, palpitations, LE edema, orthopnea, PND, syncope. Recently also seen by Urologist for urinary incontinence. Noted urinary spasm. Not placed on any medications until further neurological work up.   Past Medical History:  Diagnosis Date  . Cancer (HCC)    breast  . Headache    migraines  . Hypertension   . S/P wrist surgery 2015, 2017   for fx    Past Surgical History:  Procedure Laterality Date  . BREAST SURGERY Right 2012   lumps removed  . OVARIAN CYST SURGERY  1962    Current Medications: Prior to Admission medications   Medication Sig Start Date End Date Taking? Authorizing Provider  allopurinol (ZYLOPRIM) 300 MG tablet Take 300 mg by mouth daily.  06/25/16   Historical Provider, MD  amLODipine (NORVASC) 5 MG tablet Take 5 mg by mouth daily.  06/29/16   Historical Provider, MD  aspirin 81 MG chewable tablet Chew 81 mg by mouth daily.  Historical Provider, MD  atorvastatin (LIPITOR) 40 MG tablet Take 40 mg by mouth daily at 6 PM.  06/25/16   Historical Provider, MD  butalbital-acetaminophen-caffeine (FIORICET WITH CODEINE) 50-325-40-30 MG capsule Take 1 capsule by mouth every 4 (four) hours as needed for headache.    Historical Provider, MD  calcium-vitamin D (CALCIUM 500/D) 500-200 MG-UNIT tablet Take 1 tablet by mouth daily with breakfast.    Historical Provider, MD  Cholecalciferol (VITAMIN D3)  5000 units TABS Take 1 tablet by mouth daily.     Historical Provider, MD  clidinium-chlordiazePOXIDE (LIBRAX) 5-2.5 MG capsule Take 1 capsule by mouth 4 (four) times daily -  before meals and at bedtime.    Historical Provider, MD  clonazePAM (KLONOPIN) 1 MG tablet Take 1 mg by mouth 2 (two) times daily as needed.  06/30/16   Historical Provider, MD  co-enzyme Q-10 30 MG capsule Take 30 mg by mouth 3 (three) times daily.    Historical Provider, MD  denosumab (PROLIA) 60 MG/ML SOLN injection Inject 60 mg into the skin every 6 (six) months. Administer in upper arm, thigh, or abdomen    Historical Provider, MD  escitalopram (LEXAPRO) 10 MG tablet Take 10 mg by mouth daily.  06/25/16   Historical Provider, MD  Glucos-Chond-Hyal Ac-Ca Fructo (MOVE FREE JOINT HEALTH ADVANCE PO) Take 1 tablet by mouth daily.     Historical Provider, MD  maprotiline (LUDIOMIL) 50 MG tablet Take 50 mg by mouth at bedtime.    Historical Provider, MD  meclizine (ANTIVERT) 12.5 MG tablet Take 12.5 mg by mouth 2 (two) times daily as needed.  05/30/16   Historical Provider, MD  montelukast (SINGULAIR) 10 MG tablet Take 10 mg by mouth at bedtime.    Historical Provider, MD  MYRBETRIQ 50 MG TB24 tablet Take 50 mg by mouth daily. 07/31/16   Historical Provider, MD  omeprazole (PRILOSEC) 40 MG capsule Take 40 mg by mouth daily.    Historical Provider, MD  Oxcarbazepine (TRILEPTAL) 300 MG tablet Take 150 mg by mouth daily.  06/21/16   Historical Provider, MD  Pancrelipase, Lip-Prot-Amyl, 5000 units CPEP Take by mouth.    Historical Provider, MD  topiramate (TOPAMAX) 25 MG tablet Take 25 mg by mouth daily.  06/25/16   Historical Provider, MD    Allergies:   Demerol [meperidine]; Integra [fe fum-fepoly-vit c-vit b3]; Magnesium oxide; Metformin and related; and Sulfur   Social History   Social History  . Marital status: Married    Spouse name: Jeneen Rinks  . Number of children: 2  . Years of education: 12   Occupational History  .       [employer], retired   Social History Main Topics  . Smoking status: Never Smoker  . Smokeless tobacco: Never Used  . Alcohol use No  . Drug use: No  . Sexual activity: Not Asked   Other Topics Concern  . None   Social History Narrative   Lives with husband   Caffeine - coffee, 1 cup daily     Family History:  The patient's family history includes Cancer in her sister.   ROS:   Please see the history of present illness.    ROS All other systems reviewed and are negative.   PHYSICAL EXAM:   VS:  BP 132/68   Pulse 80   Ht 5' (1.524 m)   Wt 193 lb (87.5 kg)   LMP  (LMP Unknown)   BMI 37.69 kg/m    GEN: Well  nourished, well developed, in no acute distress sitting in wheel chair  HEENT: normal  Neck: no JVD, carotid bruits, or masses Cardiac: RRR; no murmurs, rubs, or gallops,no edema  Respiratory:  clear to auscultation bilaterally, normal work of breathing GI: soft, nontender, nondistended, + BS MS: no deformity or atrophy  Skin: warm and dry, no rash Neuro:  Alert and Oriented x 3, Strength and sensation are intact Psych: euthymic mood, full affect  Wt Readings from Last 3 Encounters:  09/14/16 193 lb (87.5 kg)  09/14/16 195 lb (88.5 kg)  08/02/16 195 lb 12.8 oz (88.8 kg)      Studies/Labs Reviewed:   EKG:  EKG is not  ordered today.    Recent Labs: 08/02/2016: Brain Natriuretic Peptide 32.4 09/11/2016: BUN 10; Creatinine, Ser 0.99; Hemoglobin 12.0; Platelets 277; Potassium 3.1; Sodium 141   Lipid Panel No results found for: CHOL, TRIG, HDL, CHOLHDL, VLDL, LDLCALC, LDLDIRECT  Additional studies/ records that were reviewed today include:    Echocardiogram: 06/04/16 at [facility]  LV ef of over 60%, grade 2 DD, normal RV function, normal valve function.    Echocardiogram: 02/09/15 at [facility]  LV EF of 60-65%, normal DD, mild LA dilation   Pending Myoview today    ASSESSMENT & PLAN:     1. Syncope - No reoccurring since discontinuation of BB. Echo with normal EF. Normal carotid doppler. Pending nuc result.   2. Gait abnormality/Weakness - Highly suspicious of neurologic issue. ? Hydrocephalus given gait and bladder issue. However MRI of head did not show any fluid. Recent worsen headache and electrical activity sensation. Advised to f/u with Dr. Leta Baptist sooner rather than later.   3. Chronic diastolic CHF - Euvolemic. No sign of CHF. Continue current medications  4. HTN -Stable on current medications.   F/u PRN unless abnormal stress test. Patient and family aggress with plan.   Medication Adjustments/Labs and Tests Ordered: Current medicines are reviewed at length with the patient today.  Concerns regarding medicines are outlined above.  Medication changes, Labs and Tests ordered today are listed in the Patient Instructions below. Patient Instructions  Medication Instructions:   Your physician recommends that you continue on your current medications as directed. Please refer to the Current Medication list given to you today.    If you need a refill on your cardiac medications before your next appointment, please call your pharmacy.  Labwork: NONE ORDERED  TODAY    Testing/Procedures: NONE ORDERED  TODAY    Follow-Up: UNTIL STRESS TEST RESULTS..... CONTACT [facility] [phone] AS NEEDED FOR  ANY CARDIAC RELATED SYMPTOMS   Any Other Special Instructions Will Be Listed Below (If Applicable).  Jarrett Soho, [place]  09/14/2016 1:01 PM    [facility] [address], [place], [place]  [address] Phone: [phone]; Fax: [phone]

## 2016-09-19 ENCOUNTER — Telehealth: Payer: Self-pay | Admitting: Diagnostic Neuroimaging

## 2016-09-19 NOTE — Telephone Encounter (Signed)
Spoke with daughter, Brantley Stage) and inquired if patient was taken to ED on 12/25 with symptoms she described. She stated "No , every time we take her they say she is not having a stroke."  She requested sooner FU than 10/15/16. Rescheduled for 10/03/16 and placed on wait list.  Advised she monitor patient and return to ED if symptoms worsen. She stated patient is currently stable. Cindy verbalized understanding, appreciation.

## 2016-09-19 NOTE — Telephone Encounter (Signed)
Pt's daughter Caren Griffins called to advise the pt has been seen at [facility] hospital on 09/11/16. On 09/07/16-she could not walk but it did improve over several days. She said the pt was seen in the ED on 09/11/16 (pls see hospital notes) She said on 09/17/16 the pt could not walk, trouble feeding herself, slurred speech. She wants to the pt to be seen sooner than 10/16/15. Please call

## 2016-09-20 ENCOUNTER — Inpatient Hospital Stay (HOSPITAL_COMMUNITY)
Admission: EM | Admit: 2016-09-20 | Discharge: 2016-09-25 | DRG: 064 | Disposition: A | Discharge status: Rehabilitation Facility | Payer: Medicare Other | Attending: Internal Medicine | Admitting: Internal Medicine

## 2016-09-20 ENCOUNTER — Telehealth (HOSPITAL_COMMUNITY): Payer: Self-pay | Admitting: Radiology

## 2016-09-20 ENCOUNTER — Encounter (HOSPITAL_COMMUNITY): Payer: Self-pay | Admitting: Emergency Medicine

## 2016-09-20 ENCOUNTER — Other Ambulatory Visit: Payer: Self-pay

## 2016-09-20 ENCOUNTER — Ambulatory Visit (HOSPITAL_COMMUNITY): Payer: Medicare Other | Attending: Cardiovascular Disease

## 2016-09-20 ENCOUNTER — Observation Stay (HOSPITAL_COMMUNITY): Payer: Medicare Other

## 2016-09-20 DIAGNOSIS — Z823 Family history of stroke: Secondary | ICD-10-CM

## 2016-09-20 DIAGNOSIS — G459 Transient cerebral ischemic attack, unspecified: Secondary | ICD-10-CM

## 2016-09-20 DIAGNOSIS — I69392 Facial weakness following cerebral infarction: Secondary | ICD-10-CM

## 2016-09-20 DIAGNOSIS — E876 Hypokalemia: Secondary | ICD-10-CM | POA: Diagnosis present

## 2016-09-20 DIAGNOSIS — R262 Difficulty in walking, not elsewhere classified: Secondary | ICD-10-CM | POA: Diagnosis present

## 2016-09-20 DIAGNOSIS — Z803 Family history of malignant neoplasm of breast: Secondary | ICD-10-CM

## 2016-09-20 DIAGNOSIS — E860 Dehydration: Secondary | ICD-10-CM | POA: Diagnosis not present

## 2016-09-20 DIAGNOSIS — R531 Weakness: Secondary | ICD-10-CM

## 2016-09-20 DIAGNOSIS — G9340 Encephalopathy, unspecified: Secondary | ICD-10-CM | POA: Diagnosis present

## 2016-09-20 DIAGNOSIS — Z7982 Long term (current) use of aspirin: Secondary | ICD-10-CM

## 2016-09-20 DIAGNOSIS — Z79899 Other long term (current) drug therapy: Secondary | ICD-10-CM

## 2016-09-20 DIAGNOSIS — Z853 Personal history of malignant neoplasm of breast: Secondary | ICD-10-CM

## 2016-09-20 DIAGNOSIS — Z800 Family history of malignant neoplasm of digestive organs: Secondary | ICD-10-CM

## 2016-09-20 DIAGNOSIS — M48061 Spinal stenosis, lumbar region without neurogenic claudication: Secondary | ICD-10-CM | POA: Diagnosis present

## 2016-09-20 DIAGNOSIS — I639 Cerebral infarction, unspecified: Secondary | ICD-10-CM | POA: Diagnosis not present

## 2016-09-20 DIAGNOSIS — Z8249 Family history of ischemic heart disease and other diseases of the circulatory system: Secondary | ICD-10-CM

## 2016-09-20 DIAGNOSIS — R29898 Other symptoms and signs involving the musculoskeletal system: Secondary | ICD-10-CM

## 2016-09-20 DIAGNOSIS — R5381 Other malaise: Secondary | ICD-10-CM | POA: Diagnosis not present

## 2016-09-20 DIAGNOSIS — Z6841 Body Mass Index (BMI) 40.0 and over, adult: Secondary | ICD-10-CM

## 2016-09-20 DIAGNOSIS — R9431 Abnormal electrocardiogram [ECG] [EKG]: Secondary | ICD-10-CM | POA: Diagnosis present

## 2016-09-20 DIAGNOSIS — G20 Parkinson's disease: Secondary | ICD-10-CM | POA: Diagnosis present

## 2016-09-20 DIAGNOSIS — E669 Obesity, unspecified: Secondary | ICD-10-CM | POA: Diagnosis present

## 2016-09-20 DIAGNOSIS — R404 Transient alteration of awareness: Secondary | ICD-10-CM

## 2016-09-20 DIAGNOSIS — Z993 Dependence on wheelchair: Secondary | ICD-10-CM

## 2016-09-20 DIAGNOSIS — R4189 Other symptoms and signs involving cognitive functions and awareness: Secondary | ICD-10-CM | POA: Diagnosis present

## 2016-09-20 DIAGNOSIS — E872 Acidosis: Secondary | ICD-10-CM | POA: Diagnosis present

## 2016-09-20 DIAGNOSIS — B961 Klebsiella pneumoniae [K. pneumoniae] as the cause of diseases classified elsewhere: Secondary | ICD-10-CM | POA: Diagnosis present

## 2016-09-20 DIAGNOSIS — R4182 Altered mental status, unspecified: Secondary | ICD-10-CM | POA: Diagnosis not present

## 2016-09-20 DIAGNOSIS — I10 Essential (primary) hypertension: Secondary | ICD-10-CM | POA: Diagnosis present

## 2016-09-20 DIAGNOSIS — G43909 Migraine, unspecified, not intractable, without status migrainosus: Secondary | ICD-10-CM | POA: Diagnosis present

## 2016-09-20 DIAGNOSIS — M109 Gout, unspecified: Secondary | ICD-10-CM | POA: Diagnosis present

## 2016-09-20 DIAGNOSIS — E7800 Pure hypercholesterolemia, unspecified: Secondary | ICD-10-CM | POA: Diagnosis present

## 2016-09-20 DIAGNOSIS — R471 Dysarthria and anarthria: Secondary | ICD-10-CM | POA: Diagnosis present

## 2016-09-20 DIAGNOSIS — R32 Unspecified urinary incontinence: Secondary | ICD-10-CM | POA: Diagnosis present

## 2016-09-20 DIAGNOSIS — R269 Unspecified abnormalities of gait and mobility: Secondary | ICD-10-CM

## 2016-09-20 DIAGNOSIS — R1310 Dysphagia, unspecified: Secondary | ICD-10-CM | POA: Diagnosis present

## 2016-09-20 DIAGNOSIS — I63312 Cerebral infarction due to thrombosis of left middle cerebral artery: Secondary | ICD-10-CM

## 2016-09-20 DIAGNOSIS — Z66 Do not resuscitate: Secondary | ICD-10-CM | POA: Diagnosis present

## 2016-09-20 DIAGNOSIS — R7303 Prediabetes: Secondary | ICD-10-CM | POA: Diagnosis present

## 2016-09-20 DIAGNOSIS — N390 Urinary tract infection, site not specified: Secondary | ICD-10-CM | POA: Diagnosis present

## 2016-09-20 DIAGNOSIS — E785 Hyperlipidemia, unspecified: Secondary | ICD-10-CM | POA: Diagnosis present

## 2016-09-20 DIAGNOSIS — G8191 Hemiplegia, unspecified affecting right dominant side: Secondary | ICD-10-CM | POA: Diagnosis present

## 2016-09-20 LAB — URINALYSIS, ROUTINE W REFLEX MICROSCOPIC
BILIRUBIN URINE: NEGATIVE
Glucose, UA: NEGATIVE mg/dL
Hgb urine dipstick: NEGATIVE
Ketones, ur: NEGATIVE mg/dL
NITRITE: POSITIVE — AB
PH: 8 (ref 5.0–8.0)
Protein, ur: NEGATIVE mg/dL
SPECIFIC GRAVITY, URINE: 1.004 — AB (ref 1.005–1.030)

## 2016-09-20 LAB — COMPREHENSIVE METABOLIC PANEL
ALT: 14 U/L (ref 14–54)
ANION GAP: 12 (ref 5–15)
AST: 21 U/L (ref 15–41)
Albumin: 3.8 g/dL (ref 3.5–5.0)
Alkaline Phosphatase: 96 U/L (ref 38–126)
BUN: 11 mg/dL (ref 6–20)
CHLORIDE: 108 mmol/L (ref 101–111)
CO2: 20 mmol/L — ABNORMAL LOW (ref 22–32)
Calcium: 9.3 mg/dL (ref 8.9–10.3)
Creatinine, Ser: 1 mg/dL (ref 0.44–1.00)
GFR, EST NON AFRICAN AMERICAN: 54 mL/min — AB (ref >60)
Glucose, Bld: 174 mg/dL — ABNORMAL HIGH (ref 65–99)
POTASSIUM: 3.2 mmol/L — AB (ref 3.5–5.1)
Sodium: 140 mmol/L (ref 135–145)
Total Bilirubin: 0.3 mg/dL (ref 0.3–1.2)
Total Protein: 6.9 g/dL (ref 6.5–8.1)

## 2016-09-20 LAB — CBC WITH DIFFERENTIAL/PLATELET
BASOS ABS: 0.1 10*3/uL (ref 0.0–0.1)
Basophils Relative: 1 %
EOS ABS: 0.2 10*3/uL (ref 0.0–0.7)
Eosinophils Relative: 2 %
HCT: 40.3 % (ref 36.0–46.0)
HEMOGLOBIN: 12.8 g/dL (ref 12.0–15.0)
LYMPHS ABS: 2 10*3/uL (ref 0.7–4.0)
LYMPHS PCT: 25 %
MCH: 28.4 pg (ref 26.0–34.0)
MCHC: 31.8 g/dL (ref 30.0–36.0)
MCV: 89.6 fL (ref 78.0–100.0)
Monocytes Absolute: 0.3 10*3/uL (ref 0.1–1.0)
Monocytes Relative: 4 %
NEUTROS PCT: 68 %
Neutro Abs: 5.6 10*3/uL (ref 1.7–7.7)
Platelets: 292 10*3/uL (ref 150–400)
RBC: 4.5 MIL/uL (ref 3.87–5.11)
RDW: 14.2 % (ref 11.5–15.5)
WBC: 8.2 10*3/uL (ref 4.0–10.5)

## 2016-09-20 LAB — MYOCARDIAL PERFUSION IMAGING
CSEPPHR: 90 {beats}/min
LV dias vol: 52 mL (ref 46–106)
LVSYSVOL: 9 mL
RATE: 0.27
Rest HR: 73 {beats}/min
SDS: 9
SRS: 12
SSS: 20
TID: 0.73

## 2016-09-20 LAB — CK: Total CK: 69 U/L (ref 38–234)

## 2016-09-20 LAB — ACETAMINOPHEN LEVEL

## 2016-09-20 LAB — LACTIC ACID, PLASMA: LACTIC ACID, VENOUS: 0.8 mmol/L (ref 0.5–1.9)

## 2016-09-20 LAB — SEDIMENTATION RATE: Sed Rate: 22 mm/hr (ref 0–22)

## 2016-09-20 LAB — TROPONIN I: Troponin I: <0.03 ng/mL (ref <0.03)

## 2016-09-20 LAB — PHOSPHORUS: Phosphorus: 2.9 mg/dL (ref 2.5–4.6)

## 2016-09-20 LAB — I-STAT CG4 LACTIC ACID, ED: LACTIC ACID, VENOUS: 2.37 mmol/L — AB (ref 0.5–1.9)

## 2016-09-20 LAB — SALICYLATE LEVEL: Salicylate Lvl: <7 mg/dL (ref 2.8–30.0)

## 2016-09-20 LAB — CBG MONITORING, ED: GLUCOSE-CAPILLARY: 181 mg/dL — AB (ref 65–99)

## 2016-09-20 LAB — AMMONIA: Ammonia: 32 umol/L (ref 9–35)

## 2016-09-20 LAB — C-REACTIVE PROTEIN: CRP: 0.9 mg/dL (ref <1.0)

## 2016-09-20 LAB — ETHANOL

## 2016-09-20 LAB — MAGNESIUM: Magnesium: 1.8 mg/dL (ref 1.7–2.4)

## 2016-09-20 MED ORDER — ACETAMINOPHEN 650 MG RE SUPP: 650.0000 mg | RECTAL | Status: DC | PRN

## 2016-09-20 MED ORDER — STROKE: EARLY STAGES OF RECOVERY BOOK
Freq: Once | Status: DC
  Filled 2016-09-20: qty 1

## 2016-09-20 MED ORDER — TECHNETIUM TC 99M TETROFOSMIN IV KIT
31.8000 | PACK | Freq: Once | INTRAVENOUS | Status: AC | PRN
  Administered 2016-09-20: 31.8 via INTRAVENOUS
  Filled 2016-09-20: qty 32

## 2016-09-20 MED ORDER — ACETAMINOPHEN 325 MG PO TABS
650.0000 mg | ORAL_TABLET | ORAL | Status: DC | PRN
  Administered 2016-09-21 – 2016-09-24 (×2): 650 mg via ORAL
  Filled 2016-09-20 (×2): qty 2

## 2016-09-20 MED ORDER — ATORVASTATIN CALCIUM 40 MG PO TABS
40.0000 mg | ORAL_TABLET | Freq: Every day | ORAL | Status: DC
  Administered 2016-09-21 (×2): 40 mg via ORAL
  Filled 2016-09-20 (×2): qty 1

## 2016-09-20 MED ORDER — ALLOPURINOL 100 MG PO TABS: 300.0000 mg | ORAL_TABLET | ORAL | Status: DC

## 2016-09-20 MED ORDER — SODIUM CHLORIDE 0.9 % IV SOLN: INTRAVENOUS | Status: DC

## 2016-09-20 MED ORDER — AMLODIPINE BESYLATE 5 MG PO TABS: 5.0000 mg | ORAL_TABLET | ORAL | Status: DC

## 2016-09-20 MED ORDER — OXCARBAZEPINE 150 MG PO TABS
150.0000 mg | ORAL_TABLET | ORAL | Status: DC
  Filled 2016-09-20: qty 1

## 2016-09-20 MED ORDER — ASPIRIN 81 MG PO CHEW
81.0000 mg | CHEWABLE_TABLET | Freq: Every day | ORAL | Status: DC
  Administered 2016-09-21 (×2): 81 mg via ORAL
  Filled 2016-09-20 (×2): qty 1

## 2016-09-20 MED ORDER — TOPIRAMATE 25 MG PO TABS
25.0000 mg | ORAL_TABLET | Freq: Every day | ORAL | Status: DC
  Administered 2016-09-21 – 2016-09-23 (×4): 25 mg via ORAL
  Filled 2016-09-20 (×4): qty 1

## 2016-09-20 MED ORDER — CLONAZEPAM 1 MG PO TABS
1.0000 mg | ORAL_TABLET | Freq: Every evening | ORAL | Status: DC | PRN
  Administered 2016-09-21 – 2016-09-24 (×4): 1 mg via ORAL
  Filled 2016-09-20 (×4): qty 1

## 2016-09-20 MED ORDER — MAPROTILINE HCL 50 MG PO TABS
50.0000 mg | ORAL_TABLET | Freq: Every day | ORAL | Status: DC
  Administered 2016-09-21 – 2016-09-24 (×5): 50 mg via ORAL
  Filled 2016-09-20 (×7): qty 1

## 2016-09-20 MED ORDER — ACETAMINOPHEN 160 MG/5ML PO SOLN: 650.0000 mg | ORAL | Status: DC | PRN

## 2016-09-20 MED ORDER — SODIUM CHLORIDE 0.9 % IV BOLUS (SEPSIS)
1000.0000 mL | Freq: Once | INTRAVENOUS | Status: AC
  Administered 2016-09-20: 1000 mL via INTRAVENOUS

## 2016-09-20 MED ORDER — MONTELUKAST SODIUM 10 MG PO TABS: 10.0000 mg | ORAL_TABLET | Freq: Every day | ORAL | Status: DC | PRN

## 2016-09-20 NOTE — ED Provider Notes (Signed)
[facility] DEPT Provider Note   CSN: [id number] Arrival date & time: 09/20/16 1625     History    Chief Complaint  Patient presents with  . Altered Mental Status     HPI Jennea Tramonte is a 75 y.o. female.  74yo F w/ PMH below including HTN, recurrent syncope vs unresponsive episodes who p/w unresponsiveness. Hx obtained primarily from daughter and husband. They report that the patient has had a one-year history of intermittent episodes of unresponsiveness that involves her staring off, awake but not speaking and not answering questions. Daughter states that these episodes are occurring more frequently, lasting for longer periods of time, and involving progressively longer periods to recovery. She has had a recent extensive evaluation by cardiology including ECHO and stress test. She has been cleared by them as not having syncope or cardiac cause of her episodes. Daughter reports that her episode on 12/25 evening resulted in slurred speech that has persisted since then, and she has been very slow to return to normal. They have an appointment in a few weeks with neurology but she has had a steady decline in generalized weakness and they cannot wait until then. Over the past month, she has declined from walking w/ cane to walker to now wheelchair. Husband is unable to pull her from bed to standing due to her generalized weakness. He was in the car with her this afternoon and she had another unresponsive episode which is why he brought her to the ED today. No fevers or vomiting.  LEVEL 5 CAVEAT DUE TO ALTERED MENTAL STATUS  Past Medical History:  Diagnosis Date  . Cancer (HCC)    breast  . Headache    migraines  . Hypertension   . S/P wrist surgery 2015, 2017   for fx     There are no active problems to display for this patient.   Past Surgical History:  Procedure Laterality Date  . BREAST SURGERY Right 2012   lumps removed  . OVARIAN CYST SURGERY  1962    OB History    No data available        Home Medications    Prior to Admission medications   Medication Sig Start Date End Date Taking? Authorizing Provider  allopurinol (ZYLOPRIM) 300 MG tablet Take 300 mg by mouth daily.  06/25/16   Historical Provider, MD  amLODipine (NORVASC) 5 MG tablet Take 5 mg by mouth daily.  06/29/16   Historical Provider, MD  aspirin 81 MG chewable tablet Chew 81 mg by mouth daily.     Historical Provider, MD  atorvastatin (LIPITOR) 40 MG tablet Take 40 mg by mouth daily at 6 PM.  06/25/16   Historical Provider, MD  butalbital-acetaminophen-caffeine (FIORICET WITH CODEINE) 50-325-40-30 MG capsule Take 1 capsule by mouth every 4 (four) hours as needed for headache.    Historical Provider, MD  calcium-vitamin D (CALCIUM 500/D) 500-200 MG-UNIT tablet Take 1 tablet by mouth daily with breakfast.    Historical Provider, MD  Cholecalciferol (VITAMIN D3) 5000 units TABS Take 1 tablet by mouth daily.     Historical Provider, MD  clidinium-chlordiazePOXIDE (LIBRAX) 5-2.5 MG capsule Take 1 capsule by mouth 4 (four) times daily -  before meals and at bedtime.    Historical Provider, MD  clonazePAM (KLONOPIN) 1 MG tablet Take 1 mg by mouth 2 (two) times daily as needed.  06/30/16   Historical Provider, MD  co-enzyme Q-10 30 MG capsule Take 30 mg by mouth 3 (three) times daily.  Historical Provider, MD  denosumab (PROLIA) 60 MG/ML SOLN injection Inject 60 mg into the skin every 6 (six) months. Administer in upper arm, thigh, or abdomen    Historical Provider, MD  escitalopram (LEXAPRO) 10 MG tablet Take 10 mg by mouth daily.  06/25/16   Historical Provider, MD  Glucos-Chond-Hyal Ac-Ca Fructo (MOVE FREE JOINT HEALTH ADVANCE PO) Take 1 tablet by mouth daily.     Historical Provider, MD  maprotiline (LUDIOMIL) 50 MG tablet Take 50 mg by mouth at bedtime.    Historical Provider, MD  meclizine (ANTIVERT) 12.5 MG tablet Take 12.5 mg by mouth 2 (two) times daily as needed.  05/30/16   Historical  Provider, MD  montelukast (SINGULAIR) 10 MG tablet Take 10 mg by mouth at bedtime.    Historical Provider, MD  MYRBETRIQ 50 MG TB24 tablet Take 50 mg by mouth daily. 07/31/16   Historical Provider, MD  omeprazole (PRILOSEC) 40 MG capsule Take 40 mg by mouth daily.    Historical Provider, MD  Oxcarbazepine (TRILEPTAL) 300 MG tablet Take 150 mg by mouth daily.  06/21/16   Historical Provider, MD  Pancrelipase, Lip-Prot-Amyl, 5000 units CPEP Take by mouth.    Historical Provider, MD  topiramate (TOPAMAX) 25 MG tablet Take 25 mg by mouth daily.  06/25/16   Historical Provider, MD      Family History  Problem Relation Age of Onset  . Cancer Sister      Social History  Substance Use Topics  . Smoking status: Never Smoker  . Smokeless tobacco: Never Used  . Alcohol use No     Allergies     Demerol [meperidine]; Integra [fe fum-fepoly-vit c-vit b3]; Magnesium oxide; Metformin and related; and Sulfur    Review of Systems  Unable to obtain ROS due to altered mental status   Physical Exam Updated Vital Signs BP 152/66   Pulse 81   Temp 98 F (36.7 C) (Axillary)   Resp 20   LMP  (LMP Unknown)   SpO2 96%   Physical Exam  Constitutional: She appears well-developed and well-nourished. No distress.  Awake, alert  HENT:  Head: Normocephalic and atraumatic.  Eyes: Conjunctivae and EOM are normal. Pupils are equal, round, and reactive to light.  Neck: Neck supple.  Cardiovascular: Normal rate, regular rhythm and normal heart sounds.   No murmur heard. Pulmonary/Chest: Effort normal and breath sounds normal. No respiratory distress.  Abdominal: Soft. Bowel sounds are normal. She exhibits no distension. There is no tenderness.  Musculoskeletal: She exhibits no edema.  Neurological: She is alert. She has normal reflexes. No cranial nerve deficit.  Slowed reaction time but  Able to state name and follow commands; no clonus Global generalized weakness with b/l 4/5 strength, no  asymmetry  Skin: Skin is warm and dry.  Healing ecchymosis L breast  Nursing note and vitals reviewed.     ED Treatments / Results  Labs (all labs ordered are listed, but only abnormal results are displayed) Labs Reviewed  CBG MONITORING, ED - Abnormal; Notable for the following:       Result Value   Glucose-Capillary 181 (*)    All other components within normal limits  COMPREHENSIVE METABOLIC PANEL  CBC WITH DIFFERENTIAL/PLATELET  ETHANOL  SALICYLATE LEVEL  ACETAMINOPHEN LEVEL  I-STAT CG4 LACTIC ACID, ED     EKG  EKG Interpretation  Date/Time:  Thursday September 20 2016 16:36:37 EST Ventricular Rate:  84 PR Interval:    QRS Duration: 97 QT Interval:  442  QTC Calculation: 523 R Axis:   66 Text Interpretation:  Sinus rhythm Consider right atrial enlargement Minimal ST depression, anterolateral leads Prolonged QT interval No significant change since last tracing Confirmed by LITTLE MD, RACHEL [id number]) on 09/20/2016 4:57:24 PM         Radiology No results found.  Procedures Procedures (including critical care time) Procedures  Medications Ordered in ED  Medications - No data to display   Initial Impression / Assessment and Plan / ED Course  I have reviewed the triage vital signs and the nursing notes.  Pertinent labs & imaging results that were available during my care of the patient were reviewed by me and considered in my medical decision making (see chart for details).  Clinical Course    Pt w/ h/o recurrent syncope/unresponsive episodes and falls, followed by cardiology and neurology, p/w another unresponsive episode. She was pulled out of car by ED staff and taken to room, where she was awake and able to follow commands but with slowed reaction time. Strength symmetric, no facial asymmetry. BG reassuring. EKG similar to previous Obtained above labs which did not show any infection or acute process to explain her altered mentation. On reexamination after  she had been in the ED, she had returned to GCS 15, with slight alteration in speech but able to answer questions appropriately. Because of my concern for her rapidly progressive decline in generalized weakness and more frequent unresponsive episodes, I contacted neurology and discussed with Dr. Nicole Kindred. He will see the patient in consultation.  Discussed admission with [facility] hospitalist, Dr. Darnell Level, who will admit for further care. After Dr. Les Pou evaluation, he has recommended MRI brain and is concerned about possibility of Parkinson's disease. Pt admitted for further work up.   Final Clinical Impressions(s) / ED Diagnoses   Final diagnoses:  Transient alteration of awareness  Weakness generalized  TIA (transient ischemic attack)     New Prescriptions   No medications on file       Sharlett Iles, MD 09/24/16 1034

## 2016-09-20 NOTE — ED Notes (Addendum)
Patient transported to x-ray. ?

## 2016-09-20 NOTE — ED Notes (Signed)
Admitting MD at bedside.

## 2016-09-20 NOTE — ED Triage Notes (Signed)
Pt here pulled out of a car for aloc . Pt very weak but will open eyes to voice and say her name and squeeze hands VSS

## 2016-09-20 NOTE — Consult Note (Signed)
Admission H&P    Chief Complaint: Altered mental status, and progressive weakness with gait deterioration.  HPI: Savannah Travis is an 75 y.o. female with a history of hypertension, hyperlipidemia and depression who was brought to the emergency room by family following an episode of unresponsiveness while riding in a car with her family earlier today. She complained of headache prior to becoming unresponsive. She began to respond after about 10-15 minutes and appeared to be confused. Patient has no memory for this event. She's had 3 such events since 09/17/2016. As well patient's also complaining of progressive weakness and loss of ability to ambulate. Onset is unclear but symptoms of been markedly worse over the past few months. She initially was able to ambulate with a cane and subsequently with a walker. For the past months or so she has been totally unable to walk without assistance. She is unable to stand from a seated position without assistance, and is unable to turn over in bed. Posture is stooped on standing and her gait is described as shuffling, especially with the right lower extremity. She's had mild swallowing difficulty and tendency to drool. No tremors been noted. She had an MRI of her brain without contrast in mid November 2017 which was unremarkable. She reportedly is also had an EEG which was unremarkable. She was not symptomatic at the time of the EEG recording.  Past Medical History:  Diagnosis Date  . Cancer (HCC)    breast  . Headache    migraines  . Hypertension   . S/P wrist surgery 2015, 2017   for fx    Past Surgical History:  Procedure Laterality Date  . BREAST SURGERY Right 2012   lumps removed  . OVARIAN CYST SURGERY  1962    Family History  Problem Relation Age of Onset  . Cancer Sister    Social History:  reports that she has never smoked. She has never used smokeless tobacco. She reports that she does not drink alcohol or use drugs.  Allergies:  Allergies   Allergen Reactions  . Demerol [Meperidine] Nausea And Vomiting  . Integra [Fe Fum-Fepoly-Vit C-Vit B3] Diarrhea  . Magnesium Oxide Diarrhea  . Metformin And Related Diarrhea  . Sulfonylureas Swelling and Rash    Medications: Preadmission medications were reviewed by me.  ROS: History obtained from patient and family members.  General ROS: negative for - chills, fatigue, fever, night sweats, weight gain or weight loss Psychological ROS: Known chronic depression Ophthalmic ROS: negative for - blurry vision, double vision, eye pain or loss of vision ENT ROS: negative for - epistaxis, nasal discharge, oral lesions, sore throat, tinnitus or vertigo Allergy and Immunology ROS: negative for - hives or itchy/watery eyes Hematological and Lymphatic ROS: negative for - bleeding problems, bruising or swollen lymph nodes Endocrine ROS: negative for - galactorrhea, hair pattern changes, polydipsia/polyuria or temperature intolerance Respiratory ROS: negative for - cough, hemoptysis, shortness of breath or wheezing Cardiovascular ROS: negative for - chest pain, dyspnea on exertion, edema or irregular heartbeat Gastrointestinal ROS: negative for - abdominal pain, diarrhea, hematemesis, nausea/vomiting or stool incontinence Genito-Urinary ROS: negative for - dysuria, hematuria, incontinence or urinary frequency/urgency Musculoskeletal ROS: negative for - joint swelling or muscular weakness Neurological ROS: as noted in HPI Dermatological ROS: negative for rash and skin lesion changes  Physical Examination: Blood pressure 113/58, pulse 83, temperature 98 F (36.7 C), temperature source Axillary, resp. rate 13, SpO2 96 %.  HEENT-  Normocephalic, no lesions, without obvious abnormality.  Normal  external eye and conjunctiva.  Normal TM's bilaterally.  Normal auditory canals and external ears. Normal external nose, mucus membranes and septum.  Normal pharynx. Neck supple with no masses, nodes, nodules  or enlargement. Cardiovascular - regular rate and rhythm, S1, S2 normal, no murmur, click, rub or gallop Lungs - chest clear, no wheezing, rales, normal symmetric air entry Abdomen - soft, non-tender; bowel sounds normal; no masses,  no organomegaly Extremities - no joint deformities, effusion, or inflammation  Neurologic Examination: Mental Status: Alert, oriented 3, no acute distress.  Speech slightly slurred with low volume, without evidence of aphasia. Able to follow commands without difficulty. Cranial Nerves: II-Visual fields were normal. III/IV/VI-Pupils were equal and reacted normally to light. Extraocular movements were full and conjugate.    V/VII-no facial numbness and no facial weakness. VIII-normal. X-minimal dysarthria. XI: trapezius strength/neck flexion strength normal bilaterally XII-midline tongue extension with normal strength. Motor: Increased muscle tone of upper and lower extremities, right greater than left, with very slow movements diffusely. Slight cogwheel rigidity of the right wrist noted Normal strength with manual muscle testing. No tremor noted. Sensory: Normal throughout. Deep Tendon Reflexes: 2+ and symmetric. Plantars: Flexor bilaterally Cerebellar: Normal finger-to-nose testing. Carotid auscultation: Normal  Results for orders placed or performed during the hospital encounter of 09/20/16 (from the past 48 hour(s))  CBG monitoring, ED     Status: Abnormal   Collection Time: 09/20/16  4:33 PM  Result Value Ref Range   Glucose-Capillary 181 (H) 65 - 99 mg/dL  Comprehensive metabolic panel     Status: Abnormal   Collection Time: 09/20/16  4:51 PM  Result Value Ref Range   Sodium 140 135 - 145 mmol/L   Potassium 3.2 (L) 3.5 - 5.1 mmol/L   Chloride 108 101 - 111 mmol/L   CO2 20 (L) 22 - 32 mmol/L   Glucose, Bld 174 (H) 65 - 99 mg/dL   BUN 11 6 - 20 mg/dL   Creatinine, Ser 1.00 0.44 - 1.00 mg/dL   Calcium 9.3 8.9 - 10.3 mg/dL   Total Protein 6.9 6.5  - 8.1 g/dL   Albumin 3.8 3.5 - 5.0 g/dL   AST 21 15 - 41 U/L   ALT 14 14 - 54 U/L   Alkaline Phosphatase 96 38 - 126 U/L   Total Bilirubin 0.3 0.3 - 1.2 mg/dL   GFR calc non Af Amer 54 (L) >60 mL/min   GFR calc Af Amer >60 >60 mL/min    Comment: (NOTE) The eGFR has been calculated using the CKD EPI equation. This calculation has not been validated in all clinical situations. eGFR's persistently <60 mL/min signify possible Chronic Kidney Disease.    Anion gap 12 5 - 15  CBC with Differential     Status: None   Collection Time: 09/20/16  4:51 PM  Result Value Ref Range   WBC 8.2 4.0 - 10.5 K/uL   RBC 4.50 3.87 - 5.11 MIL/uL   Hemoglobin 12.8 12.0 - 15.0 g/dL   HCT 40.3 36.0 - 46.0 %   MCV 89.6 78.0 - 100.0 fL   MCH 28.4 26.0 - 34.0 pg   MCHC 31.8 30.0 - 36.0 g/dL   RDW 14.2 11.5 - 15.5 %   Platelets 292 150 - 400 K/uL   Neutrophils Relative % 68 %   Neutro Abs 5.6 1.7 - 7.7 K/uL   Lymphocytes Relative 25 %   Lymphs Abs 2.0 0.7 - 4.0 K/uL   Monocytes Relative 4 %  Monocytes Absolute 0.3 0.1 - 1.0 K/uL   Eosinophils Relative 2 %   Eosinophils Absolute 0.2 0.0 - 0.7 K/uL   Basophils Relative 1 %   Basophils Absolute 0.1 0.0 - 0.1 K/uL  I-Stat CG4 Lactic Acid, ED     Status: Abnormal   Collection Time: 09/20/16  4:57 PM  Result Value Ref Range   Lactic Acid, Venous 2.37 (HH) 0.5 - 1.9 mmol/L   Comment NOTIFIED PHYSICIAN    No results found.  Assessment/Plan 75 year old lady with history of hypertension, hyperlipidemia and depression resenting with recurrent falls of altered mental status with unresponsiveness followed by confusion on regaining consciousness, for which she has no memory. Complex partial seizure disorder will need to be ruled out. TIAs unlikely with no clear focal deficits reported. Syncope is less likely as well, as patient has no memory for events and appears to have postictal confusion.  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 No tremor was noted.  Recommendations: 1. Trial of Sinemet 25-100 starting at one half tablet every 6 hours, and titrating as needed and tolerated 2. Repeat EEG, routine adult study 3. MRI of the brain with contrast 4. PT, OT and speech therapy consults 5. Will defer treatment with anticonvulsant medication, for now  We will continue to follow this patient closely with you.  C.R. Nicole Kindred, [place] [facility] [phone]  09/20/2016, 9:25 PM

## 2016-09-20 NOTE — H&P (Signed)
Savannah Travis [id number] DOB: 10-31-1940 DOA: 09/20/2016     PCP: Keane Police, MD   Outpatient Specialists: Neurology    Patient coming from:    home Lives  With family    Chief Complaint: generalized weakness.  HPI: Benay Szuch is a 75 y.o. female with medical history significant of breast cancer, hypertension, depression, anxiety, hypercholesterolemia    Presented with progressive generalized weakness currently 2.4 she is wheelchair-bound and has trouble holding a spoon hand. She's been having intermittent headaches episodes of unresponsiveness where she has trouble verbalizing. She also developed urinary incontinence and started to be seen by urology.  December 15 she had an episode and since then she could not walk. Christmas day she was out for 15 min and got worse after that. 1st of December she was walking a with a cane now in a wheelchair.  Family describes she gives out a warning it feels as a sharp pain in her head like a strike of lightning and after that sometimes the pain is so severe she is unable to respond some times she does not remember what happened. Always preceded by a headache sometimes so severe she falls down.  She feels weak all over and reports no control over any of her muscles.  Patient used to have syncopal episodes associated with bradycardia since her metoprolol was stopped those syncopal episodes have resolved.   Family states her fine motor skills have been affected. She has been loosing weight.  She had to cancel her mammogram Colonoscopy up to date.     Review of records patient had an episode of syncope in January resulting in a fall hitting her head since then she had up to 50 episodes of unprovoked syncope as well as staring spells. Examination have been extensively evaluated for this in September she was seen by cardiology and that occasionally she had episode of bradycardia her metoprolol was discontinued. Patient was seen by neurology  and started on oxcarbazepine  for presumed Seizure.  Patient had intermittent sudden pain in her back of her head at some point was diagnosed with a subtotal neuralgia and treated with respiratory nerve block and Neurontin but with no improvement. Patient in September had MRI which showed moderate-severe chronic small vessel ischemic disease  And mild perisylvian atrophy and normal EEG.  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 At C4-5 and  C2-3:: uncovertebral joint hypertrophy and facet hypertrophy with mild right and severe left foraminal stenosis.   EEG in October normal   Also have had extensive cardiology workup:  Last echo 06/04/16 at [facility] showed LV ef of over 60%, grade 2 DD, normal RV function, normal valve function. Carotid US was also done and was normal  She was evaluated once by Dr. Berline Lopes @ [place], [place] sept-oct 2017. 30 days event monitor showed sinus rhythm. No arrhythmias     IN ER:  Temp (24hrs), Avg:98 F (36.7 C), Min:98 F (36.7 C), Max:98 F (36.7 C)     RR 13 96% HR 83 BP 113/58  Lactic acid 2.37 K 3.2 bicarb 20  Following Medications were ordered in ER: Medications  sodium chloride 0.9 % bolus 1,000 mL (0 mLs Intravenous Stopped 09/20/16 2020)     ER provider discussed case  with:  Neurology who will come to evaluate the patient  Hospitalist was called for admission for progressive debility and recurrent syncope  Review of Systems:    Pertinent positives include: Syncope and episodes of unresponciveness  Constitutional:  No weight loss, night sweats, Fevers, chills, fatigue, weight loss  HEENT:  No headaches, Difficulty swallowing,Tooth/dental problems,Sore throat,  No sneezing, itching, ear ache,  nasal congestion, post nasal drip,  Cardio-vascular:  No chest pain, Orthopnea, PND, anasarca, dizziness, palpitations.no Bilateral lower extremity swelling  GI:  No heartburn, indigestion, abdominal pain, nausea, vomiting, diarrhea, change in bowel habits, loss of appetite, melena, blood in stool, hematemesis Resp:  no shortness of breath at rest. No dyspnea on exertion, No excess mucus, no productive cough, No non-productive cough, No coughing up of blood.No change in color of mucus.No wheezing. Skin:  no rash or lesions. No jaundice GU:  no dysuria, change in color of urine, no urgency or frequency. No straining to urinate.  No flank pain.  Musculoskeletal:  No joint pain or no joint swelling. No decreased range of motion. No back pain.  Psych:  No change in mood or affect. No depression or anxiety. No memory loss.  Neuro: no localizing neurological complaints, no tingling, no weakness, no double vision, no gait abnormality, no slurred speech, no confusion  As per HPI otherwise 10 point review of systems negative.   Past Medical History: Past Medical History:  Diagnosis Date  . Cancer (HCC)    breast  . Headache    migraines  . Hypertension   . S/P wrist surgery 2015, 2017   for fx   Past Surgical History:  Procedure Laterality Date  . BREAST SURGERY Right 2012   lumps removed  . OVARIAN CYST SURGERY  1962     Social History:  Ambulatory    wheelchair bound     reports that she has never smoked. She has never used smokeless tobacco. She reports that she does not drink alcohol or use drugs.  Allergies:   Allergies  Allergen Reactions  . Demerol [Meperidine] Nausea And Vomiting  . Integra [Fe Fum-Fepoly-Vit C-Vit B3] Diarrhea  . Magnesium Oxide Diarrhea  . Metformin And Related Diarrhea  . Sulfonylureas Swelling and Rash       Family History:   Family History  Problem Relation Age of Onset  . Cancer Sister     Medications: Prior to Admission  medications   Medication Sig Start Date End Date Taking? Authorizing Provider  allopurinol (ZYLOPRIM) 300 MG tablet Take 300 mg by mouth every morning.  06/25/16  Yes Historical Provider, MD  amLODipine (NORVASC) 5 MG tablet Take 5 mg by mouth every morning.  06/29/16  Yes Historical Provider, MD  aspirin 81 MG chewable tablet Chew 81 mg by mouth at bedtime.    Yes Historical Provider, MD  atorvastatin (LIPITOR) 40 MG tablet Take 40 mg by mouth at bedtime.  06/25/16  Yes Historical Provider, MD  butalbital-acetaminophen-caffeine (FIORICET WITH CODEINE) 50-325-40-30 MG capsule Take 1 capsule by mouth every 4 (four) hours as needed for headache.   Yes Historical Provider, MD  calcium-vitamin D (CALCIUM 500/D) 500-200 MG-UNIT tablet Take 1 tablet by mouth at bedtime.    Yes Historical Provider, MD  Cholecalciferol (VITAMIN D3) 5000 units TABS Take 1 tablet by mouth at bedtime.    Yes Historical Provider, MD  clidinium-chlordiazePOXIDE (LIBRAX) 5-2.5 MG capsule Take 2 capsules by mouth daily as needed (for spasms).    Yes Historical Provider, MD  clonazePAM (KLONOPIN) 1 MG tablet Take 1 mg by mouth at bedtime as needed for anxiety.  06/30/16  Yes Historical Provider, MD  Coenzyme Q10 (COQ10) 200 MG CAPS Take 200 mg by mouth 2 (two) times daily.   Yes Historical Provider, MD  denosumab (PROLIA) 60 MG/ML SOLN injection Inject 60 mg into the skin every 6 (six) months. Administer in upper arm, thigh, or abdomen   Yes Historical Provider, MD  escitalopram (LEXAPRO) 10 MG tablet Take 10 mg by mouth at bedtime.  06/25/16  Yes Historical Provider, MD  Glucos-Chond-Hyal Ac-Ca Fructo (MOVE FREE JOINT HEALTH ADVANCE PO) Take 1 tablet by mouth every morning.    Yes Historical Provider, MD  maprotiline (LUDIOMIL) 50 MG tablet Take 50 mg by mouth at bedtime.   Yes Historical Provider, MD  meclizine (ANTIVERT) 12.5 MG tablet Take 12.5 mg by mouth daily as needed for dizziness.  05/30/16  Yes Historical Provider, MD    montelukast (SINGULAIR) 10 MG tablet Take 10 mg by mouth daily as needed (for SOB).    Yes Historical Provider, MD  MYRBETRIQ 50 MG TB24 tablet Take 50 mg by mouth every morning.  07/31/16  Yes Historical Provider, MD  omeprazole (PRILOSEC) 40 MG capsule Take 40 mg by mouth at bedtime.    Yes Historical Provider, MD  OXcarbazepine (TRILEPTAL) 150 MG tablet Take 150 mg by mouth every morning.   Yes Historical Provider, MD  Pancrelipase, Lip-Prot-Amyl, 5000 units CPEP Take 5,000-10,000 Units by mouth daily as needed (diarrhea).    Yes Historical Provider, MD  topiramate (TOPAMAX) 25 MG tablet Take 25 mg by mouth at bedtime.  06/25/16  Yes Historical Provider, MD    Physical Exam: Patient Vitals for the past 24 hrs:  BP Temp Temp src Pulse Resp SpO2  09/20/16 2015 113/58 - - 83 - 96 %  09/20/16 2000 116/61 - - 81 - 97 %  09/20/16 1915 131/58 - - 83 - 96 %  09/20/16 1830 (!) 130/50 - - 79 - 100 %  09/20/16 1800 123/63 - - 75 13 97 %  09/20/16 1745 125/59 - - 75 15 96 %  09/20/16 1730 119/64 - - 74 13 96 %  09/20/16 1715 124/64 - - 74 13 95 %  09/20/16 1700 142/61 - - 77 15 96 %  09/20/16 1645 150/64 - - 81 20 96 %  09/20/16 1641 152/66 98 F (36.7 C) Axillary 81 20 96 %    1. General:  in No Acute distress 2. Psychological: Alert and  Oriented 3. Head/ENT:   Dry Mucous Membranes                          Head Non traumatic, neck supple                            Poor Dentition 4. SKIN:  decreased Skin turgor,  Skin clean Dry and intact no rash 5. Heart: Regular rate and rhythm no  Murmur, Rub or gallop 6. Lungs:  Clear to auscultation bilaterally, no wheezes or crackles   7. Abdomen: Soft,  non-tender, Non distended 8. Lower extremities: no clubbing, cyanosis, or edema 9. Neurologically right facial droop, diffused generalized weakness  10. MSK: Normal range of motion   body mass index is unknown because there is no height or weight on file.  Labs on Admission:   Labs on  Admission: I have  personally reviewed following labs and imaging studies  CBC:  Recent Labs Lab 09/20/16 1651  WBC 8.2  NEUTROABS 5.6  HGB 12.8  HCT 40.3  MCV 89.6  PLT 123456   Basic Metabolic Panel:  Recent Labs Lab 09/20/16 1651  NA 140  K 3.2*  CL 108  CO2 20*  GLUCOSE 174*  BUN 11  CREATININE 1.00  CALCIUM 9.3   GFR: Estimated Creatinine Clearance: 47.8 mL/min (by C-G formula based on SCr of 1 mg/dL). Liver Function Tests:  Recent Labs Lab 09/20/16 1651  AST 21  ALT 14  ALKPHOS 96  BILITOT 0.3  PROT 6.9  ALBUMIN 3.8   No results for input(s): LIPASE, AMYLASE in the last 168 hours. No results for input(s): AMMONIA in the last 168 hours. Coagulation Profile: No results for input(s): INR, PROTIME in the last 168 hours. Cardiac Enzymes: No results for input(s): CKTOTAL, CKMB, CKMBINDEX, TROPONINI in the last 168 hours. BNP (last 3 results) No results for input(s): PROBNP in the last 8760 hours. HbA1C: No results for input(s): HGBA1C in the last 72 hours. CBG:  Recent Labs Lab 09/20/16 1633  GLUCAP 181*   Lipid Profile: No results for input(s): CHOL, HDL, LDLCALC, TRIG, CHOLHDL, LDLDIRECT in the last 72 hours. Thyroid Function Tests: No results for input(s): TSH, T4TOTAL, FREET4, T3FREE, THYROIDAB in the last 72 hours. Anemia Panel: No results for input(s): VITAMINB12, FOLATE, FERRITIN, TIBC, IRON, RETICCTPCT in the last 72 hours.  Sepsis Labs: @LABRCNTIP (procalcitonin:4,lacticidven:4) )No results found for this or any previous visit (from the past 240 hour(s)).     UA   ordered  No results found for: HGBA1C  Estimated Creatinine Clearance: 47.8 mL/min (by C-G formula based on SCr of 1 mg/dL).  BNP (last 3 results) No results for input(s): PROBNP in the last 8760 hours.   ECG REPORT  Independently reviewed Rate: 84  Rhythm: NSR ST&T Change: No acute ischemic changes   QTC 523  There were no vitals filed for this  visit.   Cultures: No results found for: [place], [place], [place], REPTSTATUS   Radiological Exams on Admission: No results found.  Chart has been reviewed    Assessment/Plan   75 y.o. female with medical history significant of recurrent syncopal versus seizure episodes breast cancer, hypertension, depression, anxiety, hypercholesterolemia being admitted for progressive decompensation Present on Admission: . Acute encephalopathy - unclear etiology episodes with prodromal severe headache now resulting in progressive neurological decline. Appreciate neurology consult given change in neurological status since last MRI will repeat. Will order a CK levels, a NA, sedimentation rate, ammonia , follow lactic acid. Evidence of polypharmacy will need to reevaluate home medications to see if some of them could be discontinued . Unresponsiveness transient episodes preceded by severe headache would appreciate neurology input obtain MRA of her brain to evaluate for any vasculopathies . Hypokalemia will replace . Debility PT /OT evaluation . Dehydration - administer IV fluids . Prolonged QT interval - - will monitor on tele avoid QT prolonging medications, rehydrate correct electrolytes . Hypertension - continue Norvasc    Other plan as per orders.  DVT prophylaxis:  SCD    Code Status:    DNR/DNI  as per patient     Family Communication:   Family   at  Bedside  plan of care was discussed with  Daughter and Grand-daughter  Disposition Plan:   likely will need placement for rehabilitation  Would benefit from PT/OT eval prior to DC  ordered              Consults called: Neurology     Admission status:  obs   Level of care     tele          I have spent a total of 66 min on this admission   Frederick Klinger 09/20/2016, 10:08 PM    [facility]  Pager [phone]   after 2 AM please page floor coverage PA If 7AM-7PM, please  contact the day team taking care of the patient  [email or web address]  Password [email or web address]

## 2016-09-20 NOTE — ED Notes (Signed)
Pt placed on bedpan

## 2016-09-21 ENCOUNTER — Inpatient Hospital Stay (HOSPITAL_COMMUNITY): Payer: Medicare Other

## 2016-09-21 DIAGNOSIS — R0789 Other chest pain: Secondary | ICD-10-CM | POA: Diagnosis not present

## 2016-09-21 DIAGNOSIS — R7303 Prediabetes: Secondary | ICD-10-CM | POA: Diagnosis present

## 2016-09-21 DIAGNOSIS — N390 Urinary tract infection, site not specified: Secondary | ICD-10-CM | POA: Diagnosis present

## 2016-09-21 DIAGNOSIS — R7989 Other specified abnormal findings of blood chemistry: Secondary | ICD-10-CM | POA: Diagnosis not present

## 2016-09-21 DIAGNOSIS — Z853 Personal history of malignant neoplasm of breast: Secondary | ICD-10-CM | POA: Diagnosis not present

## 2016-09-21 DIAGNOSIS — B961 Klebsiella pneumoniae [K. pneumoniae] as the cause of diseases classified elsewhere: Secondary | ICD-10-CM | POA: Diagnosis present

## 2016-09-21 DIAGNOSIS — R9431 Abnormal electrocardiogram [ECG] [EKG]: Secondary | ICD-10-CM | POA: Diagnosis not present

## 2016-09-21 DIAGNOSIS — G9340 Encephalopathy, unspecified: Secondary | ICD-10-CM | POA: Diagnosis present

## 2016-09-21 DIAGNOSIS — M109 Gout, unspecified: Secondary | ICD-10-CM | POA: Diagnosis present

## 2016-09-21 DIAGNOSIS — Z6841 Body Mass Index (BMI) 40.0 and over, adult: Secondary | ICD-10-CM | POA: Diagnosis not present

## 2016-09-21 DIAGNOSIS — E876 Hypokalemia: Secondary | ICD-10-CM | POA: Diagnosis present

## 2016-09-21 DIAGNOSIS — Z800 Family history of malignant neoplasm of digestive organs: Secondary | ICD-10-CM | POA: Diagnosis not present

## 2016-09-21 DIAGNOSIS — Z823 Family history of stroke: Secondary | ICD-10-CM | POA: Diagnosis not present

## 2016-09-21 DIAGNOSIS — I639 Cerebral infarction, unspecified: Principal | ICD-10-CM

## 2016-09-21 DIAGNOSIS — I69320 Aphasia following cerebral infarction: Secondary | ICD-10-CM | POA: Diagnosis not present

## 2016-09-21 DIAGNOSIS — Z66 Do not resuscitate: Secondary | ICD-10-CM | POA: Diagnosis present

## 2016-09-21 DIAGNOSIS — G43909 Migraine, unspecified, not intractable, without status migrainosus: Secondary | ICD-10-CM | POA: Diagnosis present

## 2016-09-21 DIAGNOSIS — R2689 Other abnormalities of gait and mobility: Secondary | ICD-10-CM | POA: Diagnosis present

## 2016-09-21 DIAGNOSIS — E785 Hyperlipidemia, unspecified: Secondary | ICD-10-CM | POA: Diagnosis present

## 2016-09-21 DIAGNOSIS — F329 Major depressive disorder, single episode, unspecified: Secondary | ICD-10-CM | POA: Diagnosis present

## 2016-09-21 DIAGNOSIS — I69391 Dysphagia following cerebral infarction: Secondary | ICD-10-CM | POA: Diagnosis not present

## 2016-09-21 DIAGNOSIS — E860 Dehydration: Secondary | ICD-10-CM | POA: Diagnosis present

## 2016-09-21 DIAGNOSIS — Z803 Family history of malignant neoplasm of breast: Secondary | ICD-10-CM | POA: Diagnosis not present

## 2016-09-21 DIAGNOSIS — M25562 Pain in left knee: Secondary | ICD-10-CM | POA: Diagnosis present

## 2016-09-21 DIAGNOSIS — Z7982 Long term (current) use of aspirin: Secondary | ICD-10-CM | POA: Diagnosis not present

## 2016-09-21 DIAGNOSIS — E669 Obesity, unspecified: Secondary | ICD-10-CM | POA: Diagnosis present

## 2016-09-21 DIAGNOSIS — E872 Acidosis: Secondary | ICD-10-CM | POA: Diagnosis present

## 2016-09-21 DIAGNOSIS — I63312 Cerebral infarction due to thrombosis of left middle cerebral artery: Secondary | ICD-10-CM | POA: Diagnosis not present

## 2016-09-21 DIAGNOSIS — G8191 Hemiplegia, unspecified affecting right dominant side: Secondary | ICD-10-CM

## 2016-09-21 DIAGNOSIS — R471 Dysarthria and anarthria: Secondary | ICD-10-CM | POA: Diagnosis not present

## 2016-09-21 DIAGNOSIS — R4182 Altered mental status, unspecified: Secondary | ICD-10-CM | POA: Diagnosis present

## 2016-09-21 DIAGNOSIS — R4701 Aphasia: Secondary | ICD-10-CM | POA: Diagnosis not present

## 2016-09-21 DIAGNOSIS — R1310 Dysphagia, unspecified: Secondary | ICD-10-CM | POA: Diagnosis present

## 2016-09-21 DIAGNOSIS — R5381 Other malaise: Secondary | ICD-10-CM | POA: Diagnosis not present

## 2016-09-21 DIAGNOSIS — R32 Unspecified urinary incontinence: Secondary | ICD-10-CM | POA: Diagnosis present

## 2016-09-21 DIAGNOSIS — I10 Essential (primary) hypertension: Secondary | ICD-10-CM | POA: Diagnosis present

## 2016-09-21 DIAGNOSIS — I6789 Other cerebrovascular disease: Secondary | ICD-10-CM | POA: Diagnosis not present

## 2016-09-21 DIAGNOSIS — I69322 Dysarthria following cerebral infarction: Secondary | ICD-10-CM | POA: Diagnosis not present

## 2016-09-21 DIAGNOSIS — I69351 Hemiplegia and hemiparesis following cerebral infarction affecting right dominant side: Secondary | ICD-10-CM | POA: Diagnosis not present

## 2016-09-21 DIAGNOSIS — Z8249 Family history of ischemic heart disease and other diseases of the circulatory system: Secondary | ICD-10-CM | POA: Diagnosis not present

## 2016-09-21 DIAGNOSIS — I69398 Other sequelae of cerebral infarction: Secondary | ICD-10-CM | POA: Diagnosis not present

## 2016-09-21 DIAGNOSIS — I69390 Apraxia following cerebral infarction: Secondary | ICD-10-CM | POA: Diagnosis not present

## 2016-09-21 DIAGNOSIS — R296 Repeated falls: Secondary | ICD-10-CM | POA: Diagnosis present

## 2016-09-21 DIAGNOSIS — I6330 Cerebral infarction due to thrombosis of unspecified cerebral artery: Secondary | ICD-10-CM | POA: Diagnosis not present

## 2016-09-21 DIAGNOSIS — G20 Parkinson's disease: Secondary | ICD-10-CM | POA: Diagnosis present

## 2016-09-21 DIAGNOSIS — Z993 Dependence on wheelchair: Secondary | ICD-10-CM | POA: Diagnosis not present

## 2016-09-21 DIAGNOSIS — Z888 Allergy status to other drugs, medicaments and biological substances status: Secondary | ICD-10-CM | POA: Diagnosis not present

## 2016-09-21 DIAGNOSIS — R4189 Other symptoms and signs involving cognitive functions and awareness: Secondary | ICD-10-CM | POA: Diagnosis not present

## 2016-09-21 DIAGNOSIS — R269 Unspecified abnormalities of gait and mobility: Secondary | ICD-10-CM | POA: Diagnosis not present

## 2016-09-21 LAB — TROPONIN I
Troponin I: <0.03 ng/mL (ref <0.03)
Troponin I: <0.03 ng/mL (ref <0.03)
Troponin I: <0.03 ng/mL (ref <0.03)

## 2016-09-21 LAB — LIPID PANEL
CHOL/HDL RATIO: 3.3 ratio
CHOLESTEROL: 155 mg/dL (ref 0–200)
HDL: 47 mg/dL (ref >40)
LDL Cholesterol: 78 mg/dL (ref 0–99)
Triglycerides: 148 mg/dL (ref <150)
VLDL: 30 mg/dL (ref 0–40)

## 2016-09-21 LAB — HEMOGLOBIN A1C
Hgb A1c MFr Bld: 6.2 % — ABNORMAL HIGH (ref 4.8–5.6)
MEAN PLASMA GLUCOSE: 131 mg/dL

## 2016-09-21 MED ORDER — AMLODIPINE BESYLATE 5 MG PO TABS
5.0000 mg | ORAL_TABLET | Freq: Every day | ORAL | Status: DC
  Administered 2016-09-21 – 2016-09-25 (×5): 5 mg via ORAL
  Filled 2016-09-21 (×5): qty 1

## 2016-09-21 MED ORDER — SODIUM CHLORIDE 0.9 % IV SOLN
30.0000 meq | Freq: Once | INTRAVENOUS | Status: AC
  Administered 2016-09-21: 30 meq via INTRAVENOUS
  Filled 2016-09-21: qty 15

## 2016-09-21 MED ORDER — ALLOPURINOL 100 MG PO TABS
300.0000 mg | ORAL_TABLET | Freq: Every day | ORAL | Status: DC
  Administered 2016-09-21 – 2016-09-25 (×5): 300 mg via ORAL
  Filled 2016-09-21 (×5): qty 3

## 2016-09-21 MED ORDER — GADOBENATE DIMEGLUMINE 529 MG/ML IV SOLN
19.0000 mL | Freq: Once | INTRAVENOUS | Status: AC | PRN
  Administered 2016-09-21: 19 mL via INTRAVENOUS

## 2016-09-21 MED ORDER — OXCARBAZEPINE 150 MG PO TABS
150.0000 mg | ORAL_TABLET | Freq: Every day | ORAL | Status: DC
  Administered 2016-09-21 – 2016-09-24 (×4): 150 mg via ORAL
  Filled 2016-09-21 (×4): qty 1

## 2016-09-21 MED ORDER — STROKE: EARLY STAGES OF RECOVERY BOOK
Freq: Once | Status: AC
  Administered 2016-09-22: 06:00:00
  Filled 2016-09-21: qty 1

## 2016-09-21 NOTE — Progress Notes (Signed)
   09/20/16 2204  Step #1 Pre-Swallow Screen  History of dysphagia No  Dysphagia diet prior to admission No  Awake and alert, responding to speech? Yes  Positioned upright, with some head control? Yes  Cough on command? Yes  Maintain control of their saliva? Yes  Lick top and bottom lip? Yes  Breathe freely and maintain O2 sats? Yes  Voice quality clear (No "wet" gurgly or hoarse sounds) Yes  R Upper  Breath Sounds Clear  L Upper Breath Sounds Clear  R Lower Breath Sounds Clear  L Lower Breath Sounds Clear  Temp 98 F (36.7 C)  Step #2 Swallow Screen  Patient was kept strictly NPO prior to this screen Yes  Water via cup No problems  Water via straw No problems  Cracker No problems  Lung sounds changed after swallow screen No  Passed swallow screen Yes, see row information  .  Stroke swallow screen completed in ED by Lynne Leader, RN. MD paged to notify of appropriate diet.

## 2016-09-21 NOTE — Progress Notes (Signed)
OT NOTE  Note found in the room that states : Please add son : Samantha Atkeson to family member list to have medical information [phone]

## 2016-09-21 NOTE — Progress Notes (Signed)
PROGRESS NOTE    Savannah Travis  [id number] DOB: 1941-05-18 DOA: 09/20/2016 PCP: Keane Police, MD   Outpatient Specialists:  Karl Pock   Brief Narrative:  Savannah Travis is an 75 y.o. female with a history of hypertension, hyperlipidemia and depression who was brought to the emergency room by family following an episode of unresponsiveness while riding in a car with her family earlier today. She complained of headache prior to becoming unresponsive. She began to respond after about 10-15 minutes and appeared to be confused. Patient has no memory for this event. She's had 3 such events since 09/17/2016. As well patient's also complaining of progressive weakness and loss of ability to ambulate which happened suddenly on Dec 15th. Recent outpatient stress test was low risk.     Assessment & Plan:   Active Problems:   Unresponsiveness   Hypokalemia   Debility   Dehydration   Prolonged QT interval   Hypertension   Acute encephalopathy   Dysarthria   dysarthria  -unsure of etiology -neuro consulted- suspecting Parkinson's disease-- sinemet added -MRI pending- with contrast -SLP eval  Episode of unresponsiveness -Evidence of polypharmacy will need to reevaluate home medications to see if some of them could be discontinued -?seizures- outpatient EEG was normal -MRI done in [place] showed severe white matter changes -request records from [facility]  +U/A -symptoms of urinary frequency -check culture -per patient has had many infections recently  Hypokalemia -replete  Debility  -PT /OT evaluation  -Dehydration -s/p IVF   Prolonged QT interval - - will monitor on tele avoid QT prolonging medications, rehydrate correct electrolytes  Hypertension - continue Norvasc  H/o cancer  DVT prophylaxis:  SCD's  Code Status: DNR   Family Communication: Spoke with Daughter (Granddaughter is ICU nurse here)  Disposition Plan:  Pending-- suspect patient will be here > 2  midnights   Consultants:   neuro     Subjective: Decreased appetite  Objective: Vitals:   09/20/16 2239 09/20/16 2300 09/21/16 0006 09/21/16 0507  BP:  (!) 133/54 (!) 142/56 138/77  Pulse:  86 84 84  Resp:  19 20 18   Temp:   98.6 F (37 C) 98.6 F (37 C)  TempSrc:   Oral Oral  SpO2: 94% 94% 96% 96%  Weight:   87.9 kg (193 lb 12.6 oz)   Height:   4\' 8"  (1.422 m)     Intake/Output Summary (Last 24 hours) at 09/21/16 1146 Last data filed at 09/21/16 0900  Gross per 24 hour  Intake          1686.25 ml  Output                0 ml  Net          1686.25 ml   Filed Weights   09/21/16 0006  Weight: 87.9 kg (193 lb 12.6 oz)    Examination:  General exam: Appears calm and comfortable  Respiratory system: Clear to auscultation. Respiratory effort normal. Cardiovascular system: S1 & S2 heard, RRR. No JVD, murmurs, rubs, gallops or clicks. No pedal edema. Gastrointestinal system: Abdomen is nondistended, soft and nontender. No organomegaly or masses felt. Normal bowel sounds heard. Central nervous system: Alert and oriented. Speech is slow and deliberate.     Data Reviewed: I have personally reviewed following labs and imaging studies  CBC:  Recent Labs Lab 09/20/16 1651  WBC 8.2  NEUTROABS 5.6  HGB 12.8  HCT 40.3  MCV 89.6  PLT 123456   Basic Metabolic Panel:  Recent Labs Lab 09/20/16 1651 09/20/16 2212  NA 140  --   K 3.2*  --   CL 108  --   CO2 20*  --   GLUCOSE 174*  --   BUN 11  --   CREATININE 1.00  --   CALCIUM 9.3  --   MG  --  1.8  PHOS  --  2.9   GFR: Estimated Creatinine Clearance: 43.7 mL/min (by C-G formula based on SCr of 1 mg/dL). Liver Function Tests:  Recent Labs Lab 09/20/16 1651  AST 21  ALT 14  ALKPHOS 96  BILITOT 0.3  PROT 6.9  ALBUMIN 3.8   No results for input(s): LIPASE, AMYLASE in the last 168 hours.  Recent Labs Lab 09/20/16 2212  AMMONIA 32   Coagulation Profile: No results for input(s): INR, PROTIME in  the last 168 hours. Cardiac Enzymes:  Recent Labs Lab 09/20/16 2212 09/21/16 0531 09/21/16 1011  CKTOTAL 69  --   --   TROPONINI <0.03 <0.03 <0.03   BNP (last 3 results) No results for input(s): PROBNP in the last 8760 hours. HbA1C: No results for input(s): HGBA1C in the last 72 hours. CBG:  Recent Labs Lab 09/20/16 1633  GLUCAP 181*   Lipid Profile:  Recent Labs  09/21/16 0531  CHOL 155  HDL 47  LDLCALC 78  TRIG 148  CHOLHDL 3.3   Thyroid Function Tests: No results for input(s): TSH, T4TOTAL, FREET4, T3FREE, THYROIDAB in the last 72 hours. Anemia Panel: No results for input(s): VITAMINB12, FOLATE, FERRITIN, TIBC, IRON, RETICCTPCT in the last 72 hours. Urine analysis:    Component Value Date/Time   COLORURINE STRAW (A) 09/20/2016 2021   APPEARANCEUR CLEAR 09/20/2016 2021   LABSPEC 1.004 (L) 09/20/2016 2021   PHURINE 8.0 09/20/2016 2021   GLUCOSEU NEGATIVE 09/20/2016 2021   HGBUR NEGATIVE 09/20/2016 2021   BILIRUBINUR NEGATIVE 09/20/2016 2021   KETONESUR NEGATIVE 09/20/2016 2021   PROTEINUR NEGATIVE 09/20/2016 2021   NITRITE POSITIVE (A) 09/20/2016 2021   LEUKOCYTESUR MODERATE (A) 09/20/2016 2021     )No results found for this or any previous visit (from the past 240 hour(s)).    Anti-infectives    None       Radiology Studies: Dg Chest 2 View  Result Date: 09/20/2016 CLINICAL DATA:  Transient ischemic attack. EXAM: CHEST  2 VIEW COMPARISON:  09/11/2016 FINDINGS: The heart size and mediastinal contours are within normal limits. Both lungs are clear. Minimal atelectasis at the left lung base. The visualized skeletal structures are unremarkable. IMPRESSION: No active cardiopulmonary disease. Electronically Signed   By: Ashley Royalty M.D.   On: 09/20/2016 23:24        Scheduled Meds: .  stroke: mapping our early stages of recovery book   Does not apply Once  . allopurinol  300 mg Oral Daily  . amLODipine  5 mg Oral Daily  . aspirin  81 mg Oral  QHS  . atorvastatin  40 mg Oral QHS  . maprotiline  50 mg Oral QHS  . OXcarbazepine  150 mg Oral Daily  . topiramate  25 mg Oral QHS   Continuous Infusions: . sodium chloride Stopped (09/21/16 0149)     LOS: 0 days    Time spent: 55 min    Sibley, DO [facility] Pager [phone]  If 7PM-7AM, please contact night-coverage [email or web address] Password [facility] 09/21/2016, 11:46 AM

## 2016-09-21 NOTE — Progress Notes (Signed)
Speech Language Pathology Treatment: Cognitive-Linquistic  Patient Details Name: Savannah Travis MRN: [id number] DOB: 1940-11-05 Today's Date: 09/21/2016 Time: 1205-1220 SLP Time Calculation (min) (ACUTE ONLY): 15 min  Assessment / Plan / Recommendation Clinical Impression  Skilled treatment session focused on speech goals. SLP introduced compensatory strategies to improve speech intelligibility. Pt required Max A verbal cues to improve vocal intensity and to use over-articulation. Pt's intelligibility remains reduced at ~25% for the phrase level. Daughter present and will review strategies with pt. Continue per plan of care to address dysarthria.    HPI HPI: Savannah Travis an 75 y.o.femalewith a history of hypertension, hyperlipidemia and depression who was brought to the emergency room by family following an episode of unresponsiveness while riding in a car with her family earlier today. She complained of headache prior to becoming unresponsive. She began to respond after about 10-15 minutes and appeared to be confused. Patient has no memory for this event. She's had 3 such events since 09/17/2016. As well patient's also complaining of progressive weakness and loss of ability to ambulate. Onset is unclear but symptoms of been markedly worse over the past few months. She initially was able to ambulate with a cane and subsequently with a walker. For the past months or so she has been totally unable to walk without assistance. She is unable to stand from a seated position without assistance, and is unable to turn over in bed. Posture is stooped on standing and her gait is described as shuffling, especially with the right lower extremity. She's had mild swallowing difficulty and tendency to drool. No tremors been noted. She had an MRI of her brain without contrast in mid November 2017 which was unremarkable. She reportedly is also had an EEG which was unremarkable. She was not symptomatic at the time of the  EEG recording. 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      SLP Plan  Continue with current plan of care                    Oral Care Recommendations: Oral care BID Follow up Recommendations:  (TBD) Plan: Continue with current plan of care       GO           Savannah Travis B. Rutherford Nail, M.S., CCC-SLP Speech-Language Pathologist    Savannah Travis 09/21/2016, 1:35 PM

## 2016-09-21 NOTE — Progress Notes (Signed)
OT Cancellation Note  Patient Details Name: Savannah Travis MRN: [id number] DOB: 11-Jan-1941   Cancelled Treatment:    Reason Eval/Treat Not Completed: Patient at procedure or test/ unavailable pt at MRI currently OT to check back at the next most appropriate time  Vonita Moss   OTR/L Pager: [phone] Office: [phone] .  09/21/2016, 10:28 AM

## 2016-09-21 NOTE — Consult Note (Signed)
Physical Medicine and Rehabilitation Consult Reason for Consult: Acute infarct central left corona radiata Referring Physician: [facility]   HPI: Savannah Travis is a 75 y.o. right handed female with history of breast cancer, migraine headaches and her chart review patient lives with spouse. Independent with a walker for short distances. One level home with ramped entrance. She has had some recent falls.. Presented 09/20/2016 with generalized weakness, altered mental status and intermittent headaches. 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 MRA with focal high-grade left M1 segment stenosis elsewhere negative intracranial MRA. Patient did not receive TPA. Echocardiogram pending. Neurology consulted with workup presently ongoing. Physical therapy evaluation completed 09/21/2016 with recommendations of physical medicine rehabilitation consult.   Review of Systems  Constitutional: Negative for chills and fever.  HENT: Positive for hearing loss. Negative for tinnitus.   Eyes: Negative for blurred vision and double vision.  Respiratory: Negative for cough and shortness of breath.   Cardiovascular: Negative for chest pain, palpitations and leg swelling.  Gastrointestinal: Positive for constipation. Negative for nausea and vomiting.  Genitourinary: Negative for dysuria and hematuria.       Urinary incontinence  Musculoskeletal: Positive for falls.  Skin: Negative for rash.  Neurological: Positive for dizziness, speech change, weakness and headaches.  Psychiatric/Behavioral: Positive for depression.       Anxiety  All other systems reviewed and are negative.  Past Medical History:  Diagnosis Date  . Cancer (HCC)    breast  . Headache    migraines  . Hypertension   . S/P wrist surgery 2015, 2017   for fx   Past Surgical History:  Procedure Laterality Date  . BREAST SURGERY Right 2012   lumps removed  . OVARIAN CYST SURGERY  1962   Family History  Problem  Relation Age of Onset  . Stroke Father   . CAD Father   . Cancer Sister   . Breast cancer Sister   . Colon cancer Sister   . Stroke Other   . Rheum arthritis Neg Hx    Social History:  reports that she has never smoked. She has never used smokeless tobacco. She reports that she does not drink alcohol or use drugs. Allergies:  Allergies  Allergen Reactions  . Demerol [Meperidine] Nausea And Vomiting  . Integra [Fe Fum-Fepoly-Vit C-Vit B3] Diarrhea  . Magnesium Oxide Diarrhea  . Metformin And Related Diarrhea  . Sulfonylureas Swelling and Rash   Medications Prior to Admission  Medication Sig Dispense Refill  . allopurinol (ZYLOPRIM) 300 MG tablet Take 300 mg by mouth every morning.     Marland Kitchen amLODipine (NORVASC) 5 MG tablet Take 5 mg by mouth every morning.     Marland Kitchen aspirin 81 MG chewable tablet Chew 81 mg by mouth at bedtime.     Marland Kitchen atorvastatin (LIPITOR) 40 MG tablet Take 40 mg by mouth at bedtime.     . butalbital-acetaminophen-caffeine (FIORICET WITH CODEINE) 50-325-40-30 MG capsule Take 1 capsule by mouth every 4 (four) hours as needed for headache.    . calcium-vitamin D (CALCIUM 500/D) 500-200 MG-UNIT tablet Take 1 tablet by mouth at bedtime.     . Cholecalciferol (VITAMIN D3) 5000 units TABS Take 1 tablet by mouth at bedtime.     . clidinium-chlordiazePOXIDE (LIBRAX) 5-2.5 MG capsule Take 2 capsules by mouth daily as needed (for spasms).     . clonazePAM (KLONOPIN) 1 MG tablet Take 1 mg by mouth at bedtime as needed for anxiety.     Marland Kitchen  Coenzyme Q10 (COQ10) 200 MG CAPS Take 200 mg by mouth 2 (two) times daily.    Marland Kitchen denosumab (PROLIA) 60 MG/ML SOLN injection Inject 60 mg into the skin every 6 (six) months. Administer in upper arm, thigh, or abdomen    . escitalopram (LEXAPRO) 10 MG tablet Take 10 mg by mouth at bedtime.     . Glucos-Chond-Hyal Ac-Ca Fructo (MOVE FREE JOINT HEALTH ADVANCE PO) Take 1 tablet by mouth every morning.     . maprotiline (LUDIOMIL) 50 MG tablet Take 50 mg by  mouth at bedtime.    . meclizine (ANTIVERT) 12.5 MG tablet Take 12.5 mg by mouth daily as needed for dizziness.     . montelukast (SINGULAIR) 10 MG tablet Take 10 mg by mouth daily as needed (for SOB).     . MYRBETRIQ 50 MG TB24 tablet Take 50 mg by mouth every morning.     Marland Kitchen omeprazole (PRILOSEC) 40 MG capsule Take 40 mg by mouth at bedtime.     . OXcarbazepine (TRILEPTAL) 150 MG tablet Take 150 mg by mouth every morning.    . Pancrelipase, Lip-Prot-Amyl, 5000 units CPEP Take 5,000-10,000 Units by mouth daily as needed (diarrhea).     . topiramate (TOPAMAX) 25 MG tablet Take 25 mg by mouth at bedtime.       Home: Home Living Family/patient expects to be discharged to:: Private residence Living Arrangements: Spouse/significant other Available Help at Discharge: Family Type of Home: House Home Access: [place]: One level Bathroom Shower/Tub: Multimedia programmer: [place]: Environmental consultant - 2 wheels, Shower seat, Bedside commode, Radio producer - single point, Wheelchair - manual  Lives With: Spouse  Functional History: Prior Function Level of Independence: Independent Functional Status:  Mobility: Bed Mobility Overal bed mobility: Needs Assistance Bed Mobility: Rolling, Sidelying to Sit Rolling: Min guard Sidelying to sit: Mod assist General bed mobility comments: min guard to roll to right sidelying using LUE to reach towards rail and LLE to position, increased time required to perform rolling, increased physical assist required to power up from sidelying and rotate trunk to EOB. Transfers Overall transfer level: Needs assistance Equipment used:  (2 person face to face) Transfers: Sit to/from Stand Sit to Stand: +2 physical assistance, Min assist General transfer comment: +2 min assist to power to upright position at EOB with UE supported on therapists' arms Ambulation/Gait Ambulation/Gait assistance: Mod assist, +2 physical assistance Ambulation  Distance (Feet): 8 Feet Assistive device: 2 person hand held assist (with gait belt and wrap around support) Gait Pattern/deviations: Step-to pattern, Decreased stride length, Shuffle, Decreased dorsiflexion - right, Decreased weight shift to left General Gait Details: patient with heavy list to the right, required manual assist to weight shift to the left and cues for step placement of RLE. Increased physical assist to maintain upright positioning. During short ambulation patient with muscular fatigue and increasing weakness Gait velocity: decreased Gait velocity interpretation: <1.8 ft/sec, indicative of risk for recurrent falls    ADL: ADL Overall ADL's : Needs assistance/impaired Grooming: Wash/dry hands, Wash/dry face, Sitting, Minimal assistance Upper Body Bathing: Moderate assistance Lower Body Bathing: Total assistance Toilet Transfer: +2 for physical assistance, Minimal assistance, Stand-pivot Toileting - Clothing Manipulation Details (indicate cue type and reason): in continence noted on arrival due to x2 techs helping with hyigene bed level  General ADL Comments: pt requires weight shift R to L to help facilitate stepping. pt dragging R LE. pt with decr gait length and velocity noted  Cognition:  Cognition Overall Cognitive Status: History of cognitive impairments - at baseline (Daughter states that pt is at new baseline except for speech) Arousal/Alertness: Awake/alert Orientation Level: Oriented X4 Attention: Focused Focused Attention: Appears intact Memory: Appears intact Awareness: Appears intact Cognition Arousal/Alertness: Awake/alert Behavior During Therapy: WFL for tasks assessed/performed Overall Cognitive Status: History of cognitive impairments - at baseline (Daughter states that pt is at new baseline except for speech) General Comments: Pt able to report "i had a stroke" pt reports diffculty expressing words. pt reports outpatient therapy of L wrist due to fall in  June with break.   Blood pressure (!) 164/83, pulse 84, temperature 98.6 F (37 C), temperature source Oral, resp. rate 18, height 4\' 8"  (1.422 m), weight 87.9 kg (193 lb 12.6 oz), SpO2 96 %. Physical Exam  Vitals reviewed. HENT:  Head: Normocephalic.  Eyes: EOM are normal.  Neck: Normal range of motion. Neck supple. No tracheal deviation present. No thyromegaly present.  Cardiovascular: Normal rate and regular rhythm.   Respiratory: Effort normal and breath sounds normal. No respiratory distress.  GI: Soft. Bowel sounds are normal. She exhibits no distension.  Neurological:  She is alert, oriented. Follows simplec commands. No word finding deficits. RUE: 2/5 prox to distal. LUE 3-4/5. RLE:  1-2/5. LLE: 3- to 3/5. Decreased sensation RUE and RLE and to a lesser extent on the left. DTR's 1+. Mild right cetnral 7  Skin: Skin is warm and dry.    Results for orders placed or performed during the hospital encounter of 09/20/16 (from the past 24 hour(s))  CBG monitoring, ED     Status: Abnormal   Collection Time: 09/20/16  4:33 PM  Result Value Ref Range   Glucose-Capillary 181 (H) 65 - 99 mg/dL  Comprehensive metabolic panel     Status: Abnormal   Collection Time: 09/20/16  4:51 PM  Result Value Ref Range   Sodium 140 135 - 145 mmol/L   Potassium 3.2 (L) 3.5 - 5.1 mmol/L   Chloride 108 101 - 111 mmol/L   CO2 20 (L) 22 - 32 mmol/L   Glucose, Bld 174 (H) 65 - 99 mg/dL   BUN 11 6 - 20 mg/dL   Creatinine, Ser 1.00 0.44 - 1.00 mg/dL   Calcium 9.3 8.9 - 10.3 mg/dL   Total Protein 6.9 6.5 - 8.1 g/dL   Albumin 3.8 3.5 - 5.0 g/dL   AST 21 15 - 41 U/L   ALT 14 14 - 54 U/L   Alkaline Phosphatase 96 38 - 126 U/L   Total Bilirubin 0.3 0.3 - 1.2 mg/dL   GFR calc non Af Amer 54 (L) >60 mL/min   GFR calc Af Amer >60 >60 mL/min   Anion gap 12 5 - 15  CBC with Differential     Status: None   Collection Time: 09/20/16  4:51 PM  Result Value Ref Range   WBC 8.2 4.0 - 10.5 K/uL   RBC 4.50 3.87 -  5.11 MIL/uL   Hemoglobin 12.8 12.0 - 15.0 g/dL   HCT 40.3 36.0 - 46.0 %   MCV 89.6 78.0 - 100.0 fL   MCH 28.4 26.0 - 34.0 pg   MCHC 31.8 30.0 - 36.0 g/dL   RDW 14.2 11.5 - 15.5 %   Platelets 292 150 - 400 K/uL   Neutrophils Relative % 68 %   Neutro Abs 5.6 1.7 - 7.7 K/uL   Lymphocytes Relative 25 %   Lymphs Abs 2.0 0.7 - 4.0 K/uL  Monocytes Relative 4 %   Monocytes Absolute 0.3 0.1 - 1.0 K/uL   Eosinophils Relative 2 %   Eosinophils Absolute 0.2 0.0 - 0.7 K/uL   Basophils Relative 1 %   Basophils Absolute 0.1 0.0 - 0.1 K/uL  I-Stat CG4 Lactic Acid, ED     Status: Abnormal   Collection Time: 09/20/16  4:57 PM  Result Value Ref Range   Lactic Acid, Venous 2.37 (HH) 0.5 - 1.9 mmol/L   Comment NOTIFIED PHYSICIAN   Urinalysis, Routine w reflex microscopic     Status: Abnormal   Collection Time: 09/20/16  8:21 PM  Result Value Ref Range   Color, Urine STRAW (A) YELLOW   APPearance CLEAR CLEAR   Specific Gravity, Urine 1.004 (L) 1.005 - 1.030   pH 8.0 5.0 - 8.0   Glucose, UA NEGATIVE NEGATIVE mg/dL   Hgb urine dipstick NEGATIVE NEGATIVE   Bilirubin Urine NEGATIVE NEGATIVE   Ketones, ur NEGATIVE NEGATIVE mg/dL   Protein, ur NEGATIVE NEGATIVE mg/dL   Nitrite POSITIVE (A) NEGATIVE   Leukocytes, UA MODERATE (A) NEGATIVE   RBC / HPF 0-5 0 - 5 RBC/hpf   WBC, UA 6-30 0 - 5 WBC/hpf   Bacteria, UA RARE (A) NONE SEEN   Squamous Epithelial / LPF 0-5 (A) NONE SEEN  Sedimentation rate     Status: None   Collection Time: 09/20/16  8:52 PM  Result Value Ref Range   Sed Rate 22 0 - 22 mm/hr  Ethanol     Status: None   Collection Time: 09/20/16  8:58 PM  Result Value Ref Range   Alcohol, Ethyl (B) <5 <5 mg/dL  Salicylate level     Status: None   Collection Time: 09/20/16  8:58 PM  Result Value Ref Range   Salicylate Lvl [id number] 2.8 - 30.0 mg/dL  Acetaminophen level     Status: Abnormal   Collection Time: 09/20/16  8:58 PM  Result Value Ref Range   Acetaminophen (Tylenol), Serum <10 (L)  10 - 30 ug/mL  Lactic acid, plasma     Status: None   Collection Time: 09/20/16 10:12 PM  Result Value Ref Range   Lactic Acid, Venous 0.8 0.5 - 1.9 mmol/L  CK     Status: None   Collection Time: 09/20/16 10:12 PM  Result Value Ref Range   Total CK 69 38 - 234 U/L  Ammonia     Status: None   Collection Time: 09/20/16 10:12 PM  Result Value Ref Range   Ammonia 32 9 - 35 umol/L  C-reactive protein     Status: None   Collection Time: 09/20/16 10:12 PM  Result Value Ref Range   CRP 0.9 <1.0 mg/dL  Magnesium     Status: None   Collection Time: 09/20/16 10:12 PM  Result Value Ref Range   Magnesium 1.8 1.7 - 2.4 mg/dL  Phosphorus     Status: None   Collection Time: 09/20/16 10:12 PM  Result Value Ref Range   Phosphorus 2.9 2.5 - 4.6 mg/dL  Troponin I     Status: None   Collection Time: 09/20/16 10:12 PM  Result Value Ref Range   Troponin I <0.03 <0.03 ng/mL  Troponin I     Status: None   Collection Time: 09/21/16  5:31 AM  Result Value Ref Range   Troponin I <0.03 <0.03 ng/mL  Lipid panel     Status: None   Collection Time: 09/21/16  5:31 AM  Result Value Ref Range  Cholesterol 155 0 - 200 mg/dL   Triglycerides 148 <150 mg/dL   HDL 47 >40 mg/dL   Total CHOL/HDL Ratio 3.3 RATIO   VLDL 30 0 - 40 mg/dL   LDL Cholesterol 78 0 - 99 mg/dL  Troponin I     Status: None   Collection Time: 09/21/16 10:11 AM  Result Value Ref Range   Troponin I <0.03 <0.03 ng/mL   Dg Chest 2 View  Result Date: 09/20/2016 CLINICAL DATA:  Transient ischemic attack. EXAM: CHEST  2 VIEW COMPARISON:  09/11/2016 FINDINGS: The heart size and mediastinal contours are within normal limits. Both lungs are clear. Minimal atelectasis at the left lung base. The visualized skeletal structures are unremarkable. IMPRESSION: No active cardiopulmonary disease. Electronically Signed   By: Ashley Royalty M.D.   On: 09/20/2016 23:24   Mr Jeri Cos [id number] Contrast  Result Date: 09/21/2016 CLINICAL DATA:  TIA. Episode of  unresponsiveness with subsequent headache. EXAM: MRI HEAD WITHOUT AND WITH CONTRAST MRA HEAD WITHOUT CONTRAST TECHNIQUE: Multiplanar, multiecho pulse sequences of the brain and surrounding structures were obtained without and with intravenous contrast. Angiographic images of the head were obtained using MRA technique without contrast. CONTRAST:  71mL MULTIHANCE GADOBENATE DIMEGLUMINE 529 MG/ML IV SOLN COMPARISON:  08/08/2016 FINDINGS: MRI HEAD FINDINGS Brain: Moderate area of restricted diffusion in the central left corona radiata, measuring up to 2 cm. On coronal acquisition there is subtle asymmetric DWI hyperintensity in the left cortical spinal tracts attributed to acute [place] changes. No infarct elsewhere. No acute hemorrhage. There is chronic advanced microvascular ischemic gliosis in the cerebral white matter, deep gray nuclei, and pons. Remote hemorrhagic infarct affecting the right caudate head and anterior limb internal capsule. Brain volume is normal. No hydrocephalus or mass. No focal cortical finding to suggest a seizure focus. No chronic lobar hemorrhagic foci. Symmetric hippocampal formations when accounting for developmental cystic changes. No abnormal intracranial enhancement. Vascular: Arterial findings below. Normal dural venous sinus flow voids. Skull and upper cervical spine: Negative Sinuses/Orbits: Bilateral cataract resection.  No acute finding MRA HEAD FINDINGS Symmetric and smooth carotid arteries. Intact circle-of-Willis. Focal moderate to advanced left M1 segment stenosis after the anterior temporal branch. Elsewhere the vessels are smooth and widely patent. No generalized beading or aneurysm. IMPRESSION: 1. Acute infarct in the central left corona radiata, nonhemorrhagic. 2. Focal high-grade left M1 segment stenosis. Elsewhere negative intracranial MRA. 3. 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 Electronically Signed   By: Monte Fantasia  M.D.   On: 09/21/2016 12:02   Mr Jodene Nam Head/brain [id number] Cm  Result Date: 09/21/2016 CLINICAL DATA:  TIA. Episode of unresponsiveness with subsequent headache. EXAM: MRI HEAD WITHOUT AND WITH CONTRAST MRA HEAD WITHOUT CONTRAST TECHNIQUE: Multiplanar, multiecho pulse sequences of the brain and surrounding structures were obtained without and with intravenous contrast. Angiographic images of the head were obtained using MRA technique without contrast. CONTRAST:  70mL MULTIHANCE GADOBENATE DIMEGLUMINE 529 MG/ML IV SOLN COMPARISON:  08/08/2016 FINDINGS: MRI HEAD FINDINGS Brain: Moderate area of restricted diffusion in the central left corona radiata, measuring up to 2 cm. On coronal acquisition there is subtle asymmetric DWI hyperintensity in the left cortical spinal tracts attributed to acute [place] changes. No infarct elsewhere. No acute hemorrhage. There is chronic advanced microvascular ischemic gliosis in the cerebral white matter, deep gray nuclei, and pons. Remote hemorrhagic infarct affecting the right caudate head and anterior limb internal capsule. Brain volume is normal. No hydrocephalus or mass. No focal cortical  finding to suggest a seizure focus. No chronic lobar hemorrhagic foci. Symmetric hippocampal formations when accounting for developmental cystic changes. No abnormal intracranial enhancement. Vascular: Arterial findings below. Normal dural venous sinus flow voids. Skull and upper cervical spine: Negative Sinuses/Orbits: Bilateral cataract resection.  No acute finding MRA HEAD FINDINGS Symmetric and smooth carotid arteries. Intact circle-of-Willis. Focal moderate to advanced left M1 segment stenosis after the anterior temporal branch. Elsewhere the vessels are smooth and widely patent. No generalized beading or aneurysm. IMPRESSION: 1. Acute infarct in the central left corona radiata, nonhemorrhagic. 2. Focal high-grade left M1 segment stenosis. Elsewhere negative intracranial MRA. 3. Advanced  chronic microvascular disease including remote right basal ganglia infarct. Electronically Signed   By: Monte Fantasia M.D.   On: 09/21/2016 12:02    Assessment/Plan: Diagnosis: left corona radiata infarct 1. Does the need for close, 24 hr/day medical supervision in concert with the patient's rehab needs make it unreasonable for this patient to be served in a less intensive setting? Yes 2. Co-Morbidities requiring supervision/potential complications: hx of falls, bilateral wrist fx's 3. Due to bladder management, bowel management, safety, skin/wound care, disease management, medication administration, pain management and patient education, does the patient require 24 hr/day rehab nursing? Yes 4. Does the patient require coordinated care of a physician, rehab nurse, PT (1-2 hrs/day, 5 days/week), OT (1-2 hrs/day, 5 days/week) and SLP (1-2 hrs/day, 5 days/week) to address physical and functional deficits in the context of the above medical diagnosis(es)? Yes Addressing deficits in the following areas: balance, endurance, locomotion, strength, transferring, bowel/bladder control, bathing, dressing, feeding, grooming, toileting, cognition, speech and psychosocial support 5. Can the patient actively participate in an intensive therapy program of at least 3 hrs of therapy per day at least 5 days per week? Yes 6. The potential for patient to make measurable gains while on inpatient rehab is excellent 7. Anticipated functional outcomes upon discharge from inpatient rehab are supervision  with PT, supervision with OT, modified independent and supervision with SLP. 8. Estimated rehab length of stay to reach the above functional goals is: 13-18 days 9. Does the patient have adequate social supports and living environment to accommodate these discharge functional goals? Yes 10. Anticipated D/C setting: Home 11. Anticipated post D/C treatments: HH therapy and Outpatient therapy 12. Overall Rehab/Functional  Prognosis: excellent  RECOMMENDATIONS: This patient's condition is appropriate for continued rehabilitative care in the following setting: CIR Patient has agreed to participate in recommended program. Yes Note that insurance prior authorization may be required for reimbursement for recommended care.  Comment: Rehab Admissions Coordinator to follow up.  Thanks,  Meredith Staggers, MD, Mellody Drown    Cathlyn Parsons., PA-C 09/21/2016

## 2016-09-21 NOTE — Evaluation (Signed)
Occupational Therapy Evaluation Patient Details Name: Savannah Travis MRN: [id number] DOB: 1941-03-31 Today's Date: 09/21/2016    History of Present Illness 75 y.o. female with a history of hypertension, hyperlipidemia and depression who was brought to the emergency room by family following an episode of unresponsiveness. 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    Clinical Impression   PT admitted with L corona radiata nonhemorrhagic infarct. Pt currently with functional limitiations due to the deficits listed below (see OT problem list). PTA was helping care for spouse with progressive weakness recently per chart. Pt with limited use of L UE since fall in June.  Pt will benefit from skilled OT to increase their independence and safety with adls and balance to allow discharge CIR.     Follow Up Recommendations  CIR    Equipment Recommendations  Other (comment) (defer to next venue )    Recommendations for Other Services Rehab consult     Precautions / Restrictions Precautions Precautions: Fall      Mobility Bed Mobility Overal bed mobility: Needs Assistance Bed Mobility: Rolling;Sidelying to Sit Rolling: Min guard Sidelying to sit: Mod assist       General bed mobility comments: min guard to roll to right sidelying using LUE to reach towards rail and LLE to position, increased time required to perform rolling, increased physical assist required to power up from sidelying and rotate trunk to EOB.  Transfers Overall transfer level: Needs assistance Equipment used:  (2 person face to face) Transfers: Sit to/from Stand Sit to Stand: +2 physical assistance;Min assist         General transfer comment: +2 min assist to power to upright position at EOB with UE supported on therapists' arms    Balance Overall balance assessment: Needs assistance;History of Falls Sitting-balance support: Bilateral upper extremity supported;Feet  supported Sitting balance-Leahy Scale: Poor Sitting balance - Comments: patient reports increased right lateral lean recently, able to improve midline positioning with over compensation to the left ( reaching for foot board holding it and then returning to neurtal static sitting) Postural control: Right lateral lean Standing balance support: During functional activity;Bilateral upper extremity supported Standing balance-Leahy Scale: Poor Standing balance comment: reliance on UE support for stability and upright positioning                            ADL Overall ADL's : Needs assistance/impaired     Grooming: Wash/dry hands;Wash/dry face;Sitting;Minimal assistance   Upper Body Bathing: Moderate assistance   Lower Body Bathing: Total assistance           Toilet Transfer: +2 for physical assistance;Minimal assistance;Stand-pivot     Toileting - Clothing Manipulation Details (indicate cue type and reason): in continence noted on arrival due to x2 techs helping with hyigene bed level        General ADL Comments: pt requires weight shift R to L to help facilitate stepping. pt dragging R LE. pt with decr gait length and velocity noted     Vision Additional Comments: closing eyes during session but denies visual changes    Perception     Praxis      Pertinent Vitals/Pain Pain Assessment: No/denies pain     Hand Dominance Right   Extremity/Trunk Assessment Upper Extremity Assessment Upper Extremity Assessment: Generalized weakness;RUE deficits/detail;LUE deficits/detail RUE Deficits / Details: decr shoulder flexion 30 degrees against gravity 3- out 5 mmt, brunstrum III grasp LUE Deficits /  Details: unable to complete close fist. brunstrom II full passive wrist extension,  decr shoulder flexion 30 degrees against gravity   Lower Extremity Assessment Lower Extremity Assessment: Defer to PT evaluation RLE Deficits / Details: limited strength in RLE grossly 2/5  strength during isolated assessment RLE Coordination: decreased fine motor   Cervical / Trunk Assessment Cervical / Trunk Assessment: Other exceptions (rounded shoulders, neck flexed )   Communication Communication Communication: Expressive difficulties   Cognition Arousal/Alertness: Awake/alert Behavior During Therapy: WFL for tasks assessed/performed Overall Cognitive Status: History of cognitive impairments - at baseline (Daughter states that pt is at new baseline except for speech)                 General Comments: Pt able to report "i had a stroke" pt reports diffculty expressing words. pt reports outpatient therapy of L wrist due to fall in June with break.    General Comments       Exercises       Shoulder Instructions      Home Living Family/patient expects to be discharged to:: Private residence Living Arrangements: Spouse/significant other Available Help at Discharge: Family Type of Home: House Home Access: [place]: One level     Bathroom Shower/Tub: Occupational psychologist: [place]: Environmental consultant - 2 wheels;Shower seat;Bedside commode;Cane - single point;Wheelchair - manual      Lives With: Spouse    Prior Functioning/Environment Level of Independence: Independent                 OT Problem List: Decreased strength;Decreased activity tolerance;Impaired balance (sitting and/or standing);Decreased safety awareness;Decreased knowledge of use of DME or AE;Decreased knowledge of precautions;Cardiopulmonary status limiting activity;Obesity;Impaired UE functional use;Decreased cognition;Decreased coordination   OT Treatment/Interventions: Self-care/ADL training;Therapeutic exercise;Neuromuscular education;DME and/or AE instruction;Therapeutic activities;Cognitive remediation/compensation;Visual/perceptual remediation/compensation;Patient/family education;Balance training    OT Goals(Current goals can be  found in the care plan section) Acute Rehab OT Goals Patient Stated Goal: to not fall OT Goal Formulation: With patient Time For Goal Achievement: 10/05/16 Potential to Achieve Goals: Good  OT Frequency: Min 2X/week   Barriers to D/C: Decreased caregiver support          Co-evaluation PT/OT/SLP Co-Evaluation/Treatment: Yes Reason for Co-Treatment: Complexity of the patient's impairments (multi-system involvement);Necessary to address cognition/behavior during functional activity;For patient/therapist safety;To address functional/ADL transfers PT goals addressed during session: Mobility/safety with mobility;Strengthening/ROM OT goals addressed during session: ADL's and self-care;Strengthening/ROM      End of Session Equipment Utilized During Treatment: Gait belt Nurse Communication: Mobility status;Precautions  Activity Tolerance: Patient tolerated treatment well Patient left: in chair;with call bell/phone within reach;with chair alarm set   Time: [id number] OT Time Calculation (min): 28 min Charges:  OT General Charges $OT Visit: 1 Procedure OT Evaluation $OT Eval Moderate Complexity: 1 Procedure G-Codes:    Parke Poisson B 09-26-2016, 2:34 PM   Jeri Modena   OTR/L [phone] Office: [phone] .

## 2016-09-21 NOTE — Progress Notes (Addendum)
MRI positive for CVA-- added focused CVA orderset. Recent carotid as outpatient  Eulogio Bear DO

## 2016-09-21 NOTE — Evaluation (Signed)
Physical Therapy Evaluation Patient Details Name: Savannah Travis MRN: [id number] DOB: [birth year] Today's Date: 09/21/2016   History of Present Illness  75 y.o. female with a history of hypertension, hyperlipidemia and depression who was brought to the emergency room by family following an episode of unresponsiveness. 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   Clinical Impression  Patient demonstrates deficits in functional mobility as indicated below. Will need continued skilled PT to address deficits and maximize function. Will see as indicated and progress as tolerated. Recommend continued therapies upon acute discharge.     Follow Up Recommendations CIR;Supervision/Assistance - 24 hour    Equipment Recommendations  None recommended by PT    Recommendations for Other Services Rehab consult     Precautions / Restrictions Precautions Precautions: Fall      Mobility  Bed Mobility Overal bed mobility: Needs Assistance Bed Mobility: Rolling;Sidelying to Sit Rolling: Min guard Sidelying to sit: Mod assist       General bed mobility comments: min guard to roll to right sidelying using LUE to reach towards rail and LLE to position, increased time required to perform rolling, increased physical assist required to power up from sidelying and rotate trunk to EOB.  Transfers Overall transfer level: Needs assistance Equipment used:  (2 person face to face) Transfers: Sit to/from Stand Sit to Stand: +2 physical assistance;Min assist         General transfer comment: +2 min assist to power to upright position at EOB with UE supported on therapists' arms  Ambulation/Gait Ambulation/Gait assistance: Mod assist;+2 physical assistance Ambulation Distance (Feet): 8 Feet Assistive device: 2 person hand held assist (with gait belt and wrap around support) Gait Pattern/deviations: Step-to pattern;Decreased stride length;Shuffle;Decreased  dorsiflexion - right;Decreased weight shift to left Gait velocity: decreased Gait velocity interpretation: <1.8 ft/sec, indicative of risk for recurrent falls General Gait Details: patient with heavy list to the right, required manual assist to weight shift to the left and cues for step placement of RLE. Increased physical assist to maintain upright positioning. During short ambulation patient with muscular fatigue and increasing weakness  Stairs            Wheelchair Mobility    Modified Rankin (Stroke Patients Only) Modified Rankin (Stroke Patients Only) Pre-Morbid Rankin Score: No symptoms Modified Rankin: Severe disability     Balance Overall balance assessment: Needs assistance;History of Falls Sitting-balance support: Bilateral upper extremity supported;Feet supported Sitting balance-Leahy Scale: Poor Sitting balance - Comments: patient reports increased right lateral lean recently, able to improve midline positioning with over compensation to the left Postural control: Right lateral lean Standing balance support: During functional activity;Bilateral upper extremity supported Standing balance-Leahy Scale: Poor Standing balance comment: reliance on UE support for stability and upright positioning                             Pertinent Vitals/Pain Pain Assessment: No/denies pain    Home Living Family/patient expects to be discharged to:: Private residence Living Arrangements: Spouse/significant other Available Help at Discharge: Family Type of Home: House Home Access: Ramped entrance     Home Layout: One level Home Equipment: Environmental consultant - 2 wheels;Shower seat;Bedside commode;Cane - single point;Wheelchair - manual      Prior Function Level of Independence: Independent               Hand Dominance   Dominant Hand: Right    Extremity/Trunk Assessment   Upper  Extremity Assessment Upper Extremity Assessment: Defer to OT evaluation;RUE  deficits/detail;LUE deficits/detail    Lower Extremity Assessment Lower Extremity Assessment: Generalized weakness;RLE deficits/detail RLE Deficits / Details: limited strength in RLE grossly 2/5 strength during isolated assessment RLE Coordination: decreased fine motor    Cervical / Trunk Assessment Cervical / Trunk Assessment:  (increased body habitus)  Communication   Communication: Expressive difficulties  Cognition     Overall Cognitive Status: No family/caregiver present to determine baseline cognitive functioning                      General Comments      Exercises     Assessment/Plan    PT Assessment Patient needs continued PT services  PT Problem List Decreased strength;Decreased activity tolerance;Decreased balance;Decreased mobility;Decreased cognition;Cardiopulmonary status limiting activity          PT Treatment Interventions DME instruction;Gait training;Functional mobility training;Therapeutic activities;Therapeutic exercise;Balance training;Patient/family education    PT Goals (Current goals can be found in the Care Plan section)  Acute Rehab PT Goals Patient Stated Goal: to not fall PT Goal Formulation: With patient Time For Goal Achievement: 10/05/16 Potential to Achieve Goals: Good    Frequency Min 4X/week   Barriers to discharge        Co-evaluation PT/OT/SLP Co-Evaluation/Treatment: Yes Reason for Co-Treatment: Complexity of the patient's impairments (multi-system involvement);Necessary to address cognition/behavior during functional activity;For patient/therapist safety PT goals addressed during session: Mobility/safety with mobility;Strengthening/ROM         End of Session Equipment Utilized During Treatment: Gait belt Activity Tolerance: Patient tolerated treatment well Patient left: in chair;with call bell/phone within reach;with chair alarm set Nurse Communication: Mobility status         Time: [id number] PT Time  Calculation (min) (ACUTE ONLY): 28 min   Charges:   PT Evaluation $PT Eval Moderate Complexity: 1 Procedure     PT G Codes:        Duncan Dull 10/09/16, 2:08 PM Alben Deeds, [place] DPT  [phone]

## 2016-09-21 NOTE — Progress Notes (Signed)
PT Cancellation Note  Patient Details Name: Savannah Travis MRN: [id number] DOB: [birth year]   Cancelled Treatment:    Reason Eval/Treat Not Completed: Patient at procedure or test/unavailable, attempted x2 this am, currently at [place] 09/21/2016, 11:33 AM Alben Deeds, PT DPT  [phone]

## 2016-09-21 NOTE — Evaluation (Signed)
Speech Language Pathology Evaluation Patient Details Name: Savannah Travis MRN: [id number] DOB: January 10, 1941 Today's Date: 09/21/2016 Time: [id number] SLP Time Calculation (min) (ACUTE ONLY): 15 min  Problem List:  Patient Active Problem List   Diagnosis Date Noted  . Dysarthria 09/21/2016  . Unresponsiveness 09/20/2016  . Hypokalemia 09/20/2016  . Debility 09/20/2016  . Dehydration 09/20/2016  . Prolonged QT interval 09/20/2016  . Hypertension 09/20/2016  . Acute encephalopathy 09/20/2016   Past Medical History:  Past Medical History:  Diagnosis Date  . Cancer (HCC)    breast  . Headache    migraines  . Hypertension   . S/P wrist surgery 2015, 2017   for fx   Past Surgical History:  Past Surgical History:  Procedure Laterality Date  . BREAST SURGERY Right 2012   lumps removed  . OVARIAN CYST SURGERY  1962   HPI:  Savannah Travis an 75 y.o.femalewith a history of hypertension, hyperlipidemia and depression who was brought to the emergency room by family following an episode of unresponsiveness while riding in a car with her family earlier today. She complained of headache prior to becoming unresponsive. She began to respond after about 10-15 minutes and appeared to be confused. Patient has no memory for this event. She's had 3 such events since 09/17/2016. As well patient's also complaining of progressive weakness and loss of ability to ambulate. Onset is unclear but symptoms of been markedly worse over the past few months. She initially was able to ambulate with a cane and subsequently with a walker. For the past months or so she has been totally unable to walk without assistance. She is unable to stand from a seated position without assistance, and is unable to turn over in bed. Posture is stooped on standing and her gait is described as shuffling, especially with the right lower extremity. She's had mild swallowing difficulty and tendency to drool. No tremors been noted. She had an  MRI of her brain without contrast in mid November 2017 which was unremarkable. She reportedly is also had an EEG which was unremarkable. She was not symptomatic at the time of the EEG recording. 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   Assessment / Plan / Recommendation Clinical Impression  Daughter present during evaluation. Pt appeared fatigue d/t recently being transported to MRI. Pt with slow responses to questions addressing congition but able to provide correct answers. Pt presents with moderate dysarthria c/b decreased vocal intensity and imprecise articulation resulting in ~25% intelligible at the phrase level.  She is largely unable to communicate wants and needs d/t dysarthria. Daughter states that pt's speech has improved but is not at baseline. Duaghter endorses that cognitive function has improved to new baseline from middle of December 2017. Pt requires skilled ST services at address intelligibility. Of note, pt consuming lunch tray without any overt s/s of aspiration. ST to follow while in-house for implementation of intelligibility strategies.     SLP Assessment  Patient needs continued Speech Lanaguage Pathology Services    Follow Up Recommendations   (TBD)    Frequency and Duration min 2x/week  2 weeks      SLP Evaluation Cognition  Overall Cognitive Status: History of cognitive impairments - at baseline (Daughter states that pt is at new baseline except for speech) Arousal/Alertness: Awake/alert Orientation Level: Oriented X4 Attention: Focused Focused Attention: Appears intact Memory: Appears intact Awareness: Appears intact       Comprehension  Auditory Comprehension Overall Auditory Comprehension:  Appears within functional limits for tasks assessed Yes/No Questions: Within Functional Limits Commands: Within Functional Limits Conversation: Simple Interfering Components: Processing speed EffectiveTechniques:  Repetition Visual Recognition/Discrimination Discrimination: Not tested Reading Comprehension Reading Status: Not tested    Expression Expression Primary Mode of Expression: Verbal Verbal Expression Overall Verbal Expression: Impaired Initiation: No impairment Level of Generative/Spontaneous Verbalization: Phrase Repetition: Impaired Level of Impairment: Word level Naming: No impairment Pragmatics: Unable to assess Interfering Components: Speech intelligibility Non-Verbal Means of Communication: Not applicable Written Expression Dominant Hand: Right   Oral / Motor  Oral Motor/Sensory Function Overall Oral Motor/Sensory Function: Mild impairment Facial Symmetry:  (Mild impairment on right, non-impactful) Motor Speech Overall Motor Speech: Impaired Respiration: Within functional limits Phonation: Low vocal intensity Resonance: Within functional limits Articulation: Impaired Level of Impairment: Sentence Intelligibility: Intelligibility reduced Word: 50-74% accurate Phrase: 25-49% accurate Sentence: 0-24% accurate Conversation: Not tested Motor Planning: Witnin functional limits Motor Speech Errors: Not applicable Effective Techniques: Over-articulate;Increased vocal intensity   GO                   Nicklas Mcsweeney B. Rutherford Nail, M.S., CCC-SLP Speech-Language Pathologist  [place] Becvar 09/21/2016, 1:30 PM

## 2016-09-21 NOTE — Progress Notes (Signed)
Subjective: MRI completed, revealing acute left basal ganglion stroke.   Objective: Current vital signs: BP (!) 179/81 (BP Location: Right Arm)   Pulse 77   Temp 98.7 F (37.1 C) (Oral)   Resp 19   Ht 4' 8"  (1.422 m)   Wt 87.9 kg (193 lb 12.6 oz)   LMP  (LMP Unknown)   SpO2 98%   BMI 43.45 kg/m  Vital signs in last 24 hours: Temp:  [98 F (36.7 C)-98.7 F (37.1 C)] 98.7 F (37.1 C) (12/29 1519) Pulse Rate:  [74-89] 77 (12/29 1519) Resp:  [13-25] 19 (12/29 1519) BP: (113-179)/(50-83) 179/81 (12/29 1519) SpO2:  [92 %-100 %] 98 % (12/29 1519) FiO2 (%):  [0 %-21 %] 21 % (12/29 1240) Weight:  [87.9 kg (193 lb 12.6 oz)] 87.9 kg (193 lb 12.6 oz) (12/29 0006)  Intake/Output from previous day: 12/28 0701 - 12/29 0700 In: 1446.3 [P.O.:120; I.V.:61.3; IV Piggyback:1265] Out: -  Intake/Output this shift: Total I/O In: 240 [P.O.:240] Out: -  Nutritional status: Diet Heart Room service appropriate? Yes; Fluid consistency: Thin  Neurologic Exam: Ment: Alert and oriented. Hypophonic speech without receptive deficit. Mild expressive dysphasia.  CN: PERRL. Visual fields intact. EOMI. No nystagmus. Facial sensation normal. Right facial droop noted. Hearing intact to conversation. Speech hypophonic.  Motor: RUE 4/5 and bradykinetic.  RLE 4+/5 LUE 5/5 and bradykinetic LLE 5/5 Sensory: Intact to FT x 4.  Reflexes: Normoactive.  Cerebellar: Bradykinetic FNF without ataxia.  Gait: Deferred.   Lab Results: Results for orders placed or performed during the hospital encounter of 09/20/16 (from the past 48 hour(s))  CBG monitoring, ED     Status: Abnormal   Collection Time: 09/20/16  4:33 PM  Result Value Ref Range   Glucose-Capillary 181 (H) 65 - 99 mg/dL  Comprehensive metabolic panel     Status: Abnormal   Collection Time: 09/20/16  4:51 PM  Result Value Ref Range   Sodium 140 135 - 145 mmol/L   Potassium 3.2 (L) 3.5 - 5.1 mmol/L   Chloride 108 101 - 111 mmol/L   CO2 20 (L) 22 -  32 mmol/L   Glucose, Bld 174 (H) 65 - 99 mg/dL   BUN 11 6 - 20 mg/dL   Creatinine, Ser 1.00 0.44 - 1.00 mg/dL   Calcium 9.3 8.9 - 10.3 mg/dL   Total Protein 6.9 6.5 - 8.1 g/dL   Albumin 3.8 3.5 - 5.0 g/dL   AST 21 15 - 41 U/L   ALT 14 14 - 54 U/L   Alkaline Phosphatase 96 38 - 126 U/L   Total Bilirubin 0.3 0.3 - 1.2 mg/dL   GFR calc non Af Amer 54 (L) >60 mL/min   GFR calc Af Amer >60 >60 mL/min    Comment: (NOTE) The eGFR has been calculated using the CKD EPI equation. This calculation has not been validated in all clinical situations. eGFR's persistently <60 mL/min signify possible Chronic Kidney Disease.    Anion gap 12 5 - 15  CBC with Differential     Status: None   Collection Time: 09/20/16  4:51 PM  Result Value Ref Range   WBC 8.2 4.0 - 10.5 K/uL   RBC 4.50 3.87 - 5.11 MIL/uL   Hemoglobin 12.8 12.0 - 15.0 g/dL   HCT 40.3 36.0 - 46.0 %   MCV 89.6 78.0 - 100.0 fL   MCH 28.4 26.0 - 34.0 pg   MCHC 31.8 30.0 - 36.0 g/dL   RDW 14.2 11.5 -  15.5 %   Platelets 292 150 - 400 K/uL   Neutrophils Relative % 68 %   Neutro Abs 5.6 1.7 - 7.7 K/uL   Lymphocytes Relative 25 %   Lymphs Abs 2.0 0.7 - 4.0 K/uL   Monocytes Relative 4 %   Monocytes Absolute 0.3 0.1 - 1.0 K/uL   Eosinophils Relative 2 %   Eosinophils Absolute 0.2 0.0 - 0.7 K/uL   Basophils Relative 1 %   Basophils Absolute 0.1 0.0 - 0.1 K/uL  I-Stat CG4 Lactic Acid, ED     Status: Abnormal   Collection Time: 09/20/16  4:57 PM  Result Value Ref Range   Lactic Acid, Venous 2.37 (HH) 0.5 - 1.9 mmol/L   Comment NOTIFIED PHYSICIAN   Urinalysis, Routine w reflex microscopic     Status: Abnormal   Collection Time: 09/20/16  8:21 PM  Result Value Ref Range   Color, Urine STRAW (A) YELLOW   APPearance CLEAR CLEAR   Specific Gravity, Urine 1.004 (L) 1.005 - 1.030   pH 8.0 5.0 - 8.0   Glucose, UA NEGATIVE NEGATIVE mg/dL   Hgb urine dipstick NEGATIVE NEGATIVE   Bilirubin Urine NEGATIVE NEGATIVE   Ketones, ur NEGATIVE  NEGATIVE mg/dL   Protein, ur NEGATIVE NEGATIVE mg/dL   Nitrite POSITIVE (A) NEGATIVE   Leukocytes, UA MODERATE (A) NEGATIVE   RBC / HPF 0-5 0 - 5 RBC/hpf   WBC, UA 6-30 0 - 5 WBC/hpf   Bacteria, UA RARE (A) NONE SEEN   Squamous Epithelial / LPF 0-5 (A) NONE SEEN  Sedimentation rate     Status: None   Collection Time: 09/20/16  8:52 PM  Result Value Ref Range   Sed Rate 22 0 - 22 mm/hr  Ethanol     Status: None   Collection Time: 09/20/16  8:58 PM  Result Value Ref Range   Alcohol, Ethyl (B) <5 <5 mg/dL    Comment:        LOWEST DETECTABLE LIMIT FOR SERUM ALCOHOL IS 5 mg/dL FOR MEDICAL PURPOSES ONLY   Salicylate level     Status: None   Collection Time: 09/20/16  8:58 PM  Result Value Ref Range   Salicylate Lvl <7.8 2.8 - 30.0 mg/dL  Acetaminophen level     Status: Abnormal   Collection Time: 09/20/16  8:58 PM  Result Value Ref Range   Acetaminophen (Tylenol), Serum <10 (L) 10 - 30 ug/mL    Comment:        THERAPEUTIC CONCENTRATIONS VARY SIGNIFICANTLY. A RANGE OF 10-30 ug/mL MAY BE AN EFFECTIVE CONCENTRATION FOR MANY PATIENTS. HOWEVER, SOME ARE BEST TREATED AT CONCENTRATIONS OUTSIDE THIS RANGE. ACETAMINOPHEN CONCENTRATIONS >150 ug/mL AT 4 HOURS AFTER INGESTION AND >50 ug/mL AT 12 HOURS AFTER INGESTION ARE OFTEN ASSOCIATED WITH TOXIC REACTIONS.   Lactic acid, plasma     Status: None   Collection Time: 09/20/16 10:12 PM  Result Value Ref Range   Lactic Acid, Venous 0.8 0.5 - 1.9 mmol/L  CK     Status: None   Collection Time: 09/20/16 10:12 PM  Result Value Ref Range   Total CK 69 38 - 234 U/L  Ammonia     Status: None   Collection Time: 09/20/16 10:12 PM  Result Value Ref Range   Ammonia 32 9 - 35 umol/L  C-reactive protein     Status: None   Collection Time: 09/20/16 10:12 PM  Result Value Ref Range   CRP 0.9 <1.0 mg/dL  Magnesium  Status: None   Collection Time: 09/20/16 10:12 PM  Result Value Ref Range   Magnesium 1.8 1.7 - 2.4 mg/dL  Phosphorus      Status: None   Collection Time: 09/20/16 10:12 PM  Result Value Ref Range   Phosphorus 2.9 2.5 - 4.6 mg/dL  Troponin I     Status: None   Collection Time: 09/20/16 10:12 PM  Result Value Ref Range   Troponin I <0.03 <0.03 ng/mL  Troponin I     Status: None   Collection Time: 09/21/16  5:31 AM  Result Value Ref Range   Troponin I <0.03 <0.03 ng/mL  Lipid panel     Status: None   Collection Time: 09/21/16  5:31 AM  Result Value Ref Range   Cholesterol 155 0 - 200 mg/dL   Triglycerides 148 <150 mg/dL   HDL 47 >40 mg/dL   Total CHOL/HDL Ratio 3.3 RATIO   VLDL 30 0 - 40 mg/dL   LDL Cholesterol 78 0 - 99 mg/dL    Comment:        Total Cholesterol/HDL:CHD Risk Coronary Heart Disease Risk Table                     Men   Women  1/2 Average Risk   3.4   3.3  Average Risk       5.0   4.4  2 X Average Risk   9.6   7.1  3 X Average Risk  23.4   11.0        Use the calculated Patient Ratio above and the CHD Risk Table to determine the patient's CHD Risk.        ATP III CLASSIFICATION (LDL):  <100     mg/dL   Optimal  100-129  mg/dL   Near or Above                    Optimal  130-159  mg/dL   Borderline  160-189  mg/dL   High  >190     mg/dL   Very High   Troponin I     Status: None   Collection Time: 09/21/16 10:11 AM  Result Value Ref Range   Troponin I <0.03 <0.03 ng/mL    No results found for this or any previous visit (from the past 240 hour(s)).  Lipid Panel  Recent Labs  09/21/16 0531  CHOL 155  TRIG 148  HDL 47  CHOLHDL 3.3  VLDL 30  LDLCALC 78    Studies/Results: Dg Chest 2 View  Result Date: 09/20/2016 CLINICAL DATA:  Transient ischemic attack. EXAM: CHEST  2 VIEW COMPARISON:  09/11/2016 FINDINGS: The heart size and mediastinal contours are within normal limits. Both lungs are clear. Minimal atelectasis at the left lung base. The visualized skeletal structures are unremarkable. IMPRESSION: No active cardiopulmonary disease. Electronically Signed   By:  Ashley Royalty M.D.   On: 09/20/2016 23:24   Mr Jeri Cos XQ Contrast  Result Date: 09/21/2016 CLINICAL DATA:  TIA. Episode of unresponsiveness with subsequent headache. EXAM: MRI HEAD WITHOUT AND WITH CONTRAST MRA HEAD WITHOUT CONTRAST TECHNIQUE: Multiplanar, multiecho pulse sequences of the brain and surrounding structures were obtained without and with intravenous contrast. Angiographic images of the head were obtained using MRA technique without contrast. CONTRAST:  51m MULTIHANCE GADOBENATE DIMEGLUMINE 529 MG/ML IV SOLN COMPARISON:  08/08/2016 FINDINGS: MRI HEAD FINDINGS Brain: Moderate area of restricted diffusion in the central left corona radiata, measuring up  to 2 cm. On coronal acquisition there is subtle asymmetric DWI hyperintensity in the left cortical spinal tracts attributed to acute Wallerian changes. No infarct elsewhere. No acute hemorrhage. There is chronic advanced microvascular ischemic gliosis in the cerebral white matter, deep gray nuclei, and pons. Remote hemorrhagic infarct affecting the right caudate head and anterior limb internal capsule. Brain volume is normal. No hydrocephalus or mass. No focal cortical finding to suggest a seizure focus. No chronic lobar hemorrhagic foci. Symmetric hippocampal formations when accounting for developmental cystic changes. No abnormal intracranial enhancement. Vascular: Arterial findings below. Normal dural venous sinus flow voids. Skull and upper cervical spine: Negative Sinuses/Orbits: Bilateral cataract resection.  No acute finding MRA HEAD FINDINGS Symmetric and smooth carotid arteries. Intact circle-of-Willis. Focal moderate to advanced left M1 segment stenosis after the anterior temporal branch. Elsewhere the vessels are smooth and widely patent. No generalized beading or aneurysm. IMPRESSION: 1. Acute infarct in the central left corona radiata, nonhemorrhagic. 2. Focal high-grade left M1 segment stenosis. Elsewhere negative intracranial MRA. 3.  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 Electronically Signed   By: Monte Fantasia M.D.   On: 09/21/2016 12:02   Mr Jodene Nam Head/brain FW Cm  Result Date: 09/21/2016 CLINICAL DATA:  TIA. Episode of unresponsiveness with subsequent headache. EXAM: MRI HEAD WITHOUT AND WITH CONTRAST MRA HEAD WITHOUT CONTRAST TECHNIQUE: Multiplanar, multiecho pulse sequences of the brain and surrounding structures were obtained without and with intravenous contrast. Angiographic images of the head were obtained using MRA technique without contrast. CONTRAST:  66m MULTIHANCE GADOBENATE DIMEGLUMINE 529 MG/ML IV SOLN COMPARISON:  08/08/2016 FINDINGS: MRI HEAD FINDINGS Brain: Moderate area of restricted diffusion in the central left corona radiata, measuring up to 2 cm. On coronal acquisition there is subtle asymmetric DWI hyperintensity in the left cortical spinal tracts attributed to acute Wallerian changes. No infarct elsewhere. No acute hemorrhage. There is chronic advanced microvascular ischemic gliosis in the cerebral white matter, deep gray nuclei, and pons. Remote hemorrhagic infarct affecting the right caudate head and anterior limb internal capsule. Brain volume is normal. No hydrocephalus or mass. No focal cortical finding to suggest a seizure focus. No chronic lobar hemorrhagic foci. Symmetric hippocampal formations when accounting for developmental cystic changes. No abnormal intracranial enhancement. Vascular: Arterial findings below. Normal dural venous sinus flow voids. Skull and upper cervical spine: Negative Sinuses/Orbits: Bilateral cataract resection.  No acute finding MRA HEAD FINDINGS Symmetric and smooth carotid arteries. Intact circle-of-Willis. Focal moderate to advanced left M1 segment stenosis after the anterior temporal branch. Elsewhere the vessels are smooth and widely patent. No generalized beading or aneurysm. IMPRESSION: 1. Acute infarct in the central left  corona radiata, nonhemorrhagic. 2. Focal high-grade left M1 segment stenosis. Elsewhere negative intracranial MRA. 3. 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 Electronically Signed   By: JMonte FantasiaM.D.   On: 09/21/2016 12:02    Medications:  Scheduled: .  stroke: mapping our early stages of recovery book   Does not apply Once  .  stroke: mapping our early stages of recovery book   Does not apply Once  . allopurinol  300 mg Oral Daily  . amLODipine  5 mg Oral Daily  . aspirin  81 mg Oral QHS  . atorvastatin  40 mg Oral QHS  . maprotiline  50 mg Oral QHS  . OXcarbazepine  150 mg Oral Daily  . topiramate  25 mg Oral QHS     MRI/MRA Brain: Acute nonhemorrhagic  infarct in the central left corona radiata and adjacent putamen is seen in the context of focal high-grade left M1 segment stenosis. Also noted are advanced chronic microvascular ischemic changes and a remote right basal ganglia infarct.  Assessment: 1. Acute nonhemorrhagic infarct in the central left corona radiata and adjacent putamen is seen in the context of focal high-grade left M1 segment stenosis.  2. Stroke occurred while on ASA 81 mg qd. Classifiable as having failed ASA.  3. Episodes of unresponsiveness. 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 A syncopal spell is felt to be on the DDx as well, given her prior history of such while on Metoprolol - may have a predisposition to syncope and therefore could also occur off Metoprolol, especially if she has an underlying synucleinopathy, which can predispose to autonomic dysfunction in addition  to the motor and cognitive symptoms.  4. She was previously seen by Neurology and was started on oxcarbazepine for presumed seizure disorder.  5. She was previously evaluated with a 30 day event monitor from Sept-Oct 2017, showing sinus rhythm and no arrhythmias. 6. Paroxysmal headaches. Treated as outpatient with Topamax 25 mg qhs.    Recommendations: 1. Switch ASA to Plavix 75 mg po qd.  2. Recent echocardiogram, as documented in primary team's H & P, showed LV EF of over 60%. If the prior study was a TTE, a TEE should be obtained this visit to assess for possible left atrial appendage thrombus.  3. Recent prior carotid ultrasound was normal, as documented in the H & P. 4. Orthostatics and tilt table testing.  5. Hold off on trial of low-dose Sinemet until her stroke related deficits have improved and her motor function has plateaued to its new baseline.  6. EEG has been ordered.   7. Continue atorvastatin.  8. PT/OT/Speech.    LOS: 0 days   @Electronically  signed: Dr. Kerney Elbe 09/21/2016  4:51 PM

## 2016-09-21 NOTE — Progress Notes (Signed)
Rehab Admissions Coordinator Note:  Patient was screened by Retta Diones for appropriateness for an Inpatient Acute Rehab Consult.  At this time, we are recommending Inpatient Rehab consult.  Retta Diones 09/21/2016, 3:01 PM  I can be reached at [phone].

## 2016-09-21 NOTE — Progress Notes (Signed)
Per Dr. Nicole Kindred no repeat EKG required, Patient is not a stroke work-up, patient is being assessed and observed for siezures. Suction and oxygen set-up at patient bedside. RN will continue to monitor patient.

## 2016-09-22 ENCOUNTER — Inpatient Hospital Stay (HOSPITAL_COMMUNITY)
Admit: 2016-09-22 | Discharge: 2016-09-22 | Disposition: A | Discharge status: Home / Self Care | Payer: Medicare Other | Attending: Neurology | Admitting: Neurology

## 2016-09-22 ENCOUNTER — Inpatient Hospital Stay (HOSPITAL_COMMUNITY): Payer: Medicare Other

## 2016-09-22 DIAGNOSIS — I6330 Cerebral infarction due to thrombosis of unspecified cerebral artery: Secondary | ICD-10-CM

## 2016-09-22 DIAGNOSIS — I6789 Other cerebrovascular disease: Secondary | ICD-10-CM

## 2016-09-22 DIAGNOSIS — I63312 Cerebral infarction due to thrombosis of left middle cerebral artery: Secondary | ICD-10-CM | POA: Diagnosis present

## 2016-09-22 LAB — TROPONIN I
TROPONIN I: 0.09 ng/mL — AB (ref <0.03)
Troponin I: <0.03 ng/mL (ref <0.03)

## 2016-09-22 LAB — ECHOCARDIOGRAM COMPLETE
E decel time: 158 msec
EERAT: 12.37
FS: 29 % (ref 28–44)
HEIGHTINCHES: 56 in
IVS/LV PW RATIO, ED: 0.82
LA diam index: 1.61 cm/m2
LASIZE: 29 mm
LAVOL: 30.6 mL
LAVOLA4C: 28.6 mL
LAVOLIN: 17 mL/m2
LEFT ATRIUM END SYS DIAM: 29 mm
LV e' LATERAL: 6.74 cm/s
LVEEAVG: 12.37
LVEEMED: 12.37
LVOT area: 3.14 cm2
LVOT diameter: 20 mm
Lateral S' vel: 13.2 cm/s
MV Dec: 158
MV pk E vel: 83.4 m/s
MVPG: 3 mmHg
MVPKAVEL: 116 m/s
PW: 15.6 mm — AB (ref 0.6–1.1)
TDI e' lateral: 6.74
TDI e' medial: 5.66
WEIGHTICAEL: 3305.14 [oz_av]

## 2016-09-22 LAB — BASIC METABOLIC PANEL
Anion gap: 12 (ref 5–15)
BUN: 13 mg/dL (ref 6–20)
CALCIUM: 8.6 mg/dL — AB (ref 8.9–10.3)
CO2: 18 mmol/L — AB (ref 22–32)
Chloride: 111 mmol/L (ref 101–111)
Creatinine, Ser: 1.06 mg/dL — ABNORMAL HIGH (ref 0.44–1.00)
GFR calc Af Amer: 58 mL/min — ABNORMAL LOW (ref >60)
GFR calc non Af Amer: 50 mL/min — ABNORMAL LOW (ref >60)
Glucose, Bld: 110 mg/dL — ABNORMAL HIGH (ref 65–99)
Potassium: 3.5 mmol/L (ref 3.5–5.1)
Sodium: 141 mmol/L (ref 135–145)

## 2016-09-22 LAB — CBC
HEMATOCRIT: 36.1 % (ref 36.0–46.0)
Hemoglobin: 11.2 g/dL — ABNORMAL LOW (ref 12.0–15.0)
MCH: 27.5 pg (ref 26.0–34.0)
MCHC: 31 g/dL (ref 30.0–36.0)
MCV: 88.7 fL (ref 78.0–100.0)
Platelets: 273 10*3/uL (ref 150–400)
RBC: 4.07 MIL/uL (ref 3.87–5.11)
RDW: 14.2 % (ref 11.5–15.5)
WBC: 7.1 10*3/uL (ref 4.0–10.5)

## 2016-09-22 LAB — LIPID PANEL
Cholesterol: 156 mg/dL (ref 0–200)
HDL: 47 mg/dL (ref >40)
LDL CALC: 87 mg/dL (ref 0–99)
Total CHOL/HDL Ratio: 3.3 RATIO
Triglycerides: 111 mg/dL (ref <150)
VLDL: 22 mg/dL (ref 0–40)

## 2016-09-22 LAB — ALDOLASE: Aldolase: 4.2 U/L (ref 3.3–10.3)

## 2016-09-22 MED ORDER — SODIUM BICARBONATE 650 MG PO TABS
650.0000 mg | ORAL_TABLET | Freq: Two times a day (BID) | ORAL | Status: DC
  Administered 2016-09-22 – 2016-09-23 (×3): 650 mg via ORAL
  Filled 2016-09-22 (×3): qty 1

## 2016-09-22 MED ORDER — ATORVASTATIN CALCIUM 80 MG PO TABS
80.0000 mg | ORAL_TABLET | Freq: Every day | ORAL | Status: DC
  Administered 2016-09-22 – 2016-09-23 (×2): 80 mg via ORAL
  Filled 2016-09-22 (×2): qty 1

## 2016-09-22 MED ORDER — POTASSIUM CHLORIDE 20 MEQ PO PACK
40.0000 meq | PACK | Freq: Once | ORAL | Status: AC
  Administered 2016-09-22: 40 meq via ORAL
  Filled 2016-09-22: qty 2

## 2016-09-22 MED ORDER — PERFLUTREN LIPID MICROSPHERE
INTRAVENOUS | Status: AC
  Administered 2016-09-22: 2 mL
  Filled 2016-09-22: qty 10

## 2016-09-22 MED ORDER — CLOPIDOGREL BISULFATE 75 MG PO TABS
75.0000 mg | ORAL_TABLET | Freq: Every day | ORAL | Status: DC
  Administered 2016-09-22 – 2016-09-25 (×4): 75 mg via ORAL
  Filled 2016-09-22 (×4): qty 1

## 2016-09-22 MED ORDER — METOPROLOL TARTRATE 12.5 MG HALF TABLET
12.5000 mg | ORAL_TABLET | Freq: Once | ORAL | Status: AC
  Administered 2016-09-22: 12.5 mg via ORAL
  Filled 2016-09-22: qty 1

## 2016-09-22 NOTE — Progress Notes (Signed)
Notified on-call of elevated Troponin 0.09

## 2016-09-22 NOTE — Progress Notes (Signed)
Inpatient Diabetes Program Recommendations  AACE/ADA: New Consensus Statement on Inpatient Glycemic Control (2015)  Target Ranges:  Prepandial:   less than 140 mg/dL      Peak postprandial:   less than 180 mg/dL (1-2 hours)      Critically ill patients:  140 - 180 mg/dL   Results for DEANNIE, RESETAR (MRN [id number]) as of 09/22/2016 14:51  Ref. Range 09/21/2016 05:31 09/22/2016 04:33  Glucose Latest Ref Range: 65 - 99 mg/dL  110 (H)  Hemoglobin A1C Latest Ref Range: 4.8 - 5.6 % 6.2 (H)    Review of Glycemic Control  Diabetes history: No Outpatient Diabetes medications: NA Current orders for Inpatient glycemic control: None  Inpatient Diabetes Program Recommendations: Insulin-Correction: While inpatient, may want to consider ordering CBGs and Novolog sensitive correction if needed. HgbA1C: A1C 6.2% on 09/21/16 indicating an average glucose of 131 mg/dl over the past 2-3 months. Per ADA, if A1C is 6.5% or greater then criteria met for DM dx. With A1C of 6.2%, MD could dx patient with Pre-Diabetes. Recommend follow up with PCP for glycemic control/monitoring.  NOTE: Noted Consult for "education"; chart reviewed. Diabetes Coordinator not on campus but available by pager for questions or concerns from 8am-5pm daily.  Per ADA standards of care, with A1C of 6.2% patient would be considered to have Pre-Diabetes. If MD is dx with Pre-Diabetes please inform patient and NURSING so bedside nursing can provide education on Pre-Diabetes.  Thanks, Barnie Alderman, RN, MSN, CDE Diabetes Coordinator Inpatient Diabetes Program [phone] (Team Pager from 8am to 5pm)

## 2016-09-22 NOTE — Progress Notes (Signed)
Patient ID: Savannah Travis, female   DOB: 18-Sep-1941, 75 y.o.   MRN: [id number]  Notified by nursing of elevated troponin at 0.09 up from 0.03.  Patient already on aspirin, statin.  Was on beta blocker which was discontinued secondary to bradycardia.  EKG is nonischemic however will add beta blocker and continue to trend troponins.  Consider starting heparin if increasing.

## 2016-09-22 NOTE — Progress Notes (Signed)
  Echocardiogram 2D Echocardiogram with Definity has been performed.  Savannah Travis 09/22/2016, 10:44 AM

## 2016-09-22 NOTE — Progress Notes (Signed)
EEG Completed; Results Pending; Dr Gifford Shave notified.

## 2016-09-22 NOTE — Progress Notes (Addendum)
STROKE TEAM PROGRESS NOTE   HISTORY OF PRESENT ILLNESS (per record) Savannah Travis is an 75 y.o. female with a history of hypertension, hyperlipidemia and depression who was brought to the emergency room by family following an episode of unresponsiveness while riding in a car with her family earlier today. She complained of headache prior to becoming unresponsive. She began to respond after about 10-15 minutes and appeared to be confused. Patient has no memory for this event. She's had 3 such events since 09/17/2016. As well patient's also complaining of progressive weakness and loss of ability to ambulate. Onset is unclear but symptoms of been markedly worse over the past few months. She initially was able to ambulate with a cane and subsequently with a walker. For the past months or so she has been totally unable to walk without assistance. She is unable to stand from a seated position without assistance, and is unable to turn over in bed. Posture is stooped on standing and her gait is described as shuffling, especially with the right lower extremity. She's had mild swallowing difficulty and tendency to drool. No tremors been noted. She had an MRI of her brain without contrast in mid November 2017 which was unremarkable. She reportedly is also had an EEG which was unremarkable. She was not symptomatic at the time of the EEG recording.    SUBJECTIVE (INTERVAL HISTORY) Her daughter is at the bedside.  We reviewed the MRI pictures and the current lab work.  We discussed nutrition and diabetes.  Overall she feels her condition is unchanged. She still feels "weak on the right" and weak in both legs.  Her daughter reports that since the beginning of December, the patient has gradually "failed" walking and balance to the extent that she has gone from a cane to a walker and, just prior to admission, a wheelchair due to lower extremity weakness.  In addition, over this same perios of time, patient has has significant  worsening of bladder control   OBJECTIVE Temp:  [98 F (36.7 C)-98.8 F (37.1 C)] 98.5 F (36.9 C) (12/30 0954) Pulse Rate:  [68-98] 70 (12/30 0855) Cardiac Rhythm: Normal sinus rhythm (12/30 0700) Resp:  [19-20] 20 (12/30 0855) BP: (121-179)/(61-83) 126/61 (12/30 0855) SpO2:  [95 %-98 %] 96 % (12/30 0855) FiO2 (%):  [21 %] 21 % (12/29 1240) Weight:  [93.7 kg (206 lb 9.1 oz)] 93.7 kg (206 lb 9.1 oz) (12/30 0500)  CBC:   Recent Labs Lab 09/20/16 1651 09/22/16 0433  WBC 8.2 7.1  NEUTROABS 5.6  --   HGB 12.8 11.2*  HCT 40.3 36.1  MCV 89.6 88.7  PLT 292 123456    Basic Metabolic Panel:   Recent Labs Lab 09/20/16 1651 09/20/16 2212 09/22/16 0433  NA 140  --  141  K 3.2*  --  3.5  CL 108  --  111  CO2 20*  --  18*  GLUCOSE 174*  --  110*  BUN 11  --  13  CREATININE 1.00  --  1.06*  CALCIUM 9.3  --  8.6*  MG  --  1.8  --   PHOS  --  2.9  --     Lipid Panel:     Component Value Date/Time   CHOL 156 09/22/2016 0433   TRIG 111 09/22/2016 0433   HDL 47 09/22/2016 0433   CHOLHDL 3.3 09/22/2016 0433   VLDL 22 09/22/2016 0433   LDLCALC 87 09/22/2016 0433   HgbA1c:  Lab Results  Component Value Date   HGBA1C 6.2 (H) 09/21/2016   Urine Drug Screen: No results found for: LABOPIA, COCAINSCRNUR, LABBENZ, AMPHETMU, THCU, LABBARB    IMAGING  Dg Chest 2 View 09/20/2016 No active cardiopulmonary disease.   Mr Jodene Nam Head/brain Wo Cm 09/21/2016 1. Acute infarct in the central left corona radiata, nonhemorrhagic.  2. Focal high-grade left M1 segment stenosis. Elsewhere negative intracranial MRA.  3. 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    PHYSICAL EXAM  HEENT-  Normocephalic, no lesions, without obvious abnormality.  Normal external eye and conjunctiva.  Normal external nose, mucus membranes and septum.  Normal pharynx. Neck supple with no masses, nodes, nodules or enlargement. Cardiovascular - regular rate and  rhythm, S1, S2 normal, no murmur, click, rub or gallop Lungs - chest clear, no wheezing, rales, normal symmetric air entry Abdomen - soft, non-tender; bowel sounds normal; no masses,  no organomegaly Extremities - no joint deformities, effusion, or inflammation  Neurologic Examination: Mental Status: Alert, oriented 3, no acute distress.  Speech slightly slurred with low volume, without evidence of aphasia. Able to follow commands without difficulty. Cranial Nerves: II-Visual fields were normal. III/IV/VI-Pupils were equal and reacted normally to light. Extraocular movements were full and conjugate.    V/VII-no facial numbness and no facial weakness. VIII-normal. X-minimal dysarthria. XI: trapezius strength/neck flexion strength normal bilaterally XII-midline tongue extension with normal strength. Motor: Increased muscle tone of upper and lower extremities, right greater than left, with very slow movements diffusely. Slight cogwheel rigidity of the right wrist noted.  Patient with limitations in hip flexion.  Unclear if related to body habitus or pure weakness.  Right side weaker than left in upper and lower extremities.  Seems disproportionate to MRI findings. Sensory: Normal light touch throughout. Plantars: Flexor bilaterally Cerebellar: Normal finger-to-nose testing.  ASSESSMENT/PLAN Savannah Travis is a 75 y.o. female with history of hypertension, breast cancer, hyperlipidemia and depression  presenting with unresponsive episodes, dysphagia, confusion, and progressive weakness. She did not receive IV t-PA due to unknown time of onset.  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  Carotid Doppler - 08/08/2016 - normal carotid and vertebral arteries.  EEG:  Without abnormality.  Performed because  daughter described 3 episodes >15 minutes of trouble talking during the course of the year.  Patient stated there were probably more  2D Echo - pending  LDL - 87  HgbA1c pending  VTE prophylaxis - SCDs Diet Heart Room service appropriate? Yes; Fluid consistency: Thin  aspirin 81 mg daily prior to admission, now on clopidogrel 75 mg daily  Patient counseled to be compliant with her antithrombotic medications  Ongoing aggressive stroke risk factor management  Therapy recommendations: Inpatient rehabilitation recommended. M.D. consult pending.  Disposition: Pending  Hypertension  Stable  Permissive hypertension (OK if < 220/120) but gradually normalize in 5-7 days  Long-term BP goal normotensive  Hyperlipidemia  Home meds: Lipitor 40 mg daily resumed in hospital  LDL 87, goal < 70  Increase Lipitor to 80 mg daily  Continue statin at discharge   Other Stroke Risk Factors  Advanced age  Obesity, Body mass index is 46.31 kg/m., recommend weight loss, diet and exercise as appropriate    Other Active Problems  Creatinine - 1.06  Hospital day # 1  ATTENDING NOTE: Patient  was seen and examined by me personally. Documentation reflects findings. The laboratory and radiographic studies reviewed by me. ROS completed by me personally and pertinent positives fully documented  Condition: unchanged  Assessment and plan completed by me personally and fully documented above. Plans/Recommendations include:     History of lower extremity weakness that has progressed over the month is concerning L-spine MRI given exam is not consistent with MRI of the head  Patient has had worsening challenges with her upper extremity strength.  07/2016 Cspine reviewed.  Patient has apparently had multiple falls since that film  Stroke work-up ongoing  Patient would benefit from diabetic education with daughter present  Will follow  SIGNED BY: Dr. Elissa Hefty    To contact Stroke  Continuity provider, please refer to [email or web address] After hours, contact General Neurology

## 2016-09-22 NOTE — Progress Notes (Signed)
PROGRESS NOTE    Savannah Travis  [id number] DOB: 03/14/1941 DOA: 09/20/2016 PCP: Keane Police, MD    Brief Narrative: Savannah Travis an 75 y.o.femalewith a history of hypertension, hyperlipidemia and depression who was brought to the emergency room by family following an episode of unresponsiveness while riding in a car with her family earlier today. She complained of headache prior to becoming unresponsive. She began to respond after about 10-15 minutes and appeared to be confused. Patient has no memory for this event. She's had 3 such events since 09/17/2016. As well patient's also complaining of progressive weakness and loss of ability to ambulate which happened suddenly on Dec 15th. Recent outpatient stress test was low risk.      Assessment & Plan:   Principal Problem:   CVA (cerebral vascular accident) ([place]) Active Problems:   Unresponsiveness   Hypokalemia   Debility   Dehydration   Prolonged QT interval   Hypertension   Acute encephalopathy   Dysarthria  #1 acute CVA MRI patient with acute CVA in the left corona radiata, MRA with focal high-grade left M1 segment stenosis. Felt likely secondary small vessel disease. Patient with carotid Dopplers 08/08/2016 with no significant ICA stenosis. EEG done without any abnormality. 2-D echo with EF of 123456, grade 1 diastolic dysfunction, no source of emboli noted. LDL of 87. Hemoglobin A1c was 6.2 on 09/21/2016. Neurology recommended probable TEE which likely won't be done until Tuesday. Cardiology performed. Patient was on aspirin prior to admission currently on Plavix for secondary stroke prevention. Neurology following and appreciate input and recommendations.  #2 acute encephalopathy Likely secondary to problem #1 and possible progressive Parkinson's disease. Patient has been started on Sinemet per neurology. Follow.  #3 hypokalemia Replete.  #4 hypertension Permissive hypertension. Follow.  #5 acidosis Place on  bicarbonate tablets 4 doses.    DVT prophylaxis: SCDs Code Status: DO NOT RESUSCITATE Family Communication: Updated patient and daughter at bedside. Disposition Plan: Skilled nursing versus CIR versus home with home health.   Consultants:   Neurology: Dr. Nicole Kindred 09/20/2016  Procedures:   2-D echo 09/22/2016  Chest x-ray 09/20/2016  MRI/MRA brain 09/21/2016  MRI CNL spine 09/22/2016  EEG 09/22/2016  Antimicrobials:    None   Subjective: Patient denies any chest pain. No shortness of breath. Patient with complaints of some right-sided upper extremity and lower extremity weakness. No syncopal episodes. Per daughter weakness has been noted drastically since December 15.  Objective: Vitals:   09/22/16 0615 09/22/16 0855 09/22/16 0954 09/22/16 1308  BP: 121/64 126/61  (!) 133/59  Pulse: 70 70  73  Resp:  20  20  Temp: 98.7 F (37.1 C)  98.5 F (36.9 C) 98.7 F (37.1 C)  TempSrc: Oral  Oral Oral  SpO2: 96% 96%  100%  Weight:      Height:        Intake/Output Summary (Last 24 hours) at 09/22/16 1324 Last data filed at 09/22/16 0954  Gross per 24 hour  Intake              240 ml  Output                0 ml  Net              240 ml   Filed Weights   09/21/16 0006 09/22/16 0500  Weight: 87.9 kg (193 lb 12.6 oz) 93.7 kg (206 lb 9.1 oz)    Examination:  General exam: Appears calm and comfortable  Respiratory system:  Clear to auscultation. Respiratory effort normal. Cardiovascular system: S1 & S2 heard, RRR. No JVD, murmurs, rubs, gallops or clicks. No pedal edema. Gastrointestinal system: Abdomen is nondistended, soft and nontender. No organomegaly or masses felt. Normal bowel sounds heard. Central nervous system: Alert and oriented. No focal neurological deficits. Extremities: 4/5 bilateral lower extremity strength,4/5 right upper extremity strength,5/5 left upper extremity strength. Skin: No rashes, lesions or ulcers Psychiatry: Judgement and insight  appear normal. Mood & affect appropriate.     Data Reviewed: I have personally reviewed following labs and imaging studies  CBC:  Recent Labs Lab 09/20/16 1651 09/22/16 0433  WBC 8.2 7.1  NEUTROABS 5.6  --   HGB 12.8 11.2*  HCT 40.3 36.1  MCV 89.6 88.7  PLT 292 123456   Basic Metabolic Panel:  Recent Labs Lab 09/20/16 1651 09/20/16 2212 09/22/16 0433  NA 140  --  141  K 3.2*  --  3.5  CL 108  --  111  CO2 20*  --  18*  GLUCOSE 174*  --  110*  BUN 11  --  13  CREATININE 1.00  --  1.06*  CALCIUM 9.3  --  8.6*  MG  --  1.8  --   PHOS  --  2.9  --    GFR: Estimated Creatinine Clearance: 42.9 mL/min (by C-G formula based on SCr of 1.06 mg/dL (H)). Liver Function Tests:  Recent Labs Lab 09/20/16 1651  AST 21  ALT 14  ALKPHOS 96  BILITOT 0.3  PROT 6.9  ALBUMIN 3.8   No results for input(s): LIPASE, AMYLASE in the last 168 hours.  Recent Labs Lab 09/20/16 2212  AMMONIA 32   Coagulation Profile: No results for input(s): INR, PROTIME in the last 168 hours. Cardiac Enzymes:  Recent Labs Lab 09/20/16 2212  09/21/16 1011 09/21/16 1619 09/21/16 2228 09/22/16 0433 09/22/16 1017  CKTOTAL 69  --   --   --   --   --   --   TROPONINI <0.03  < > <0.03 <0.03 0.09* <0.03 <0.03  < > = values in this interval not displayed. BNP (last 3 results) No results for input(s): PROBNP in the last 8760 hours. HbA1C:  Recent Labs  09/21/16 0531  HGBA1C 6.2*   CBG:  Recent Labs Lab 09/20/16 1633  GLUCAP 181*   Lipid Profile:  Recent Labs  09/21/16 0531 09/22/16 0433  CHOL 155 156  HDL 47 47  LDLCALC 78 87  TRIG 148 111  CHOLHDL 3.3 3.3   Thyroid Function Tests: No results for input(s): TSH, T4TOTAL, FREET4, T3FREE, THYROIDAB in the last 72 hours. Anemia Panel: No results for input(s): VITAMINB12, FOLATE, FERRITIN, TIBC, IRON, RETICCTPCT in the last 72 hours. Sepsis Labs:  Recent Labs Lab 09/20/16 1657 09/20/16 2212  LATICACIDVEN 2.37* 0.8     No results found for this or any previous visit (from the past 240 hour(s)).       Radiology Studies: Dg Chest 2 View  Result Date: 09/20/2016 CLINICAL DATA:  Transient ischemic attack. EXAM: CHEST  2 VIEW COMPARISON:  09/11/2016 FINDINGS: The heart size and mediastinal contours are within normal limits. Both lungs are clear. Minimal atelectasis at the left lung base. The visualized skeletal structures are unremarkable. IMPRESSION: No active cardiopulmonary disease. Electronically Signed   By: Ashley Royalty M.D.   On: 09/20/2016 23:24   Mr Jeri Cos [id number] Contrast  Result Date: 09/21/2016 CLINICAL DATA:  TIA. Episode of unresponsiveness with subsequent headache. EXAM: MRI  HEAD WITHOUT AND WITH CONTRAST MRA HEAD WITHOUT CONTRAST TECHNIQUE: Multiplanar, multiecho pulse sequences of the brain and surrounding structures were obtained without and with intravenous contrast. Angiographic images of the head were obtained using MRA technique without contrast. CONTRAST:  48mL MULTIHANCE GADOBENATE DIMEGLUMINE 529 MG/ML IV SOLN COMPARISON:  08/08/2016 FINDINGS: MRI HEAD FINDINGS Brain: Moderate area of restricted diffusion in the central left corona radiata, measuring up to 2 cm. On coronal acquisition there is subtle asymmetric DWI hyperintensity in the left cortical spinal tracts attributed to acute [place] changes. No infarct elsewhere. No acute hemorrhage. There is chronic advanced microvascular ischemic gliosis in the cerebral white matter, deep gray nuclei, and pons. Remote hemorrhagic infarct affecting the right caudate head and anterior limb internal capsule. Brain volume is normal. No hydrocephalus or mass. No focal cortical finding to suggest a seizure focus. No chronic lobar hemorrhagic foci. Symmetric hippocampal formations when accounting for developmental cystic changes. No abnormal intracranial enhancement. Vascular: Arterial findings below. Normal dural venous sinus flow voids. Skull and upper  cervical spine: Negative Sinuses/Orbits: Bilateral cataract resection.  No acute finding MRA HEAD FINDINGS Symmetric and smooth carotid arteries. Intact circle-of-Willis. Focal moderate to advanced left M1 segment stenosis after the anterior temporal branch. Elsewhere the vessels are smooth and widely patent. No generalized beading or aneurysm. IMPRESSION: 1. Acute infarct in the central left corona radiata, nonhemorrhagic. 2. Focal high-grade left M1 segment stenosis. Elsewhere negative intracranial MRA. 3. 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 Electronically Signed   By: Monte Fantasia M.D.   On: 09/21/2016 12:02   Mr Jodene Nam Head/brain [id number] Cm  Result Date: 09/21/2016 CLINICAL DATA:  TIA. Episode of unresponsiveness with subsequent headache. EXAM: MRI HEAD WITHOUT AND WITH CONTRAST MRA HEAD WITHOUT CONTRAST TECHNIQUE: Multiplanar, multiecho pulse sequences of the brain and surrounding structures were obtained without and with intravenous contrast. Angiographic images of the head were obtained using MRA technique without contrast. CONTRAST:  47mL MULTIHANCE GADOBENATE DIMEGLUMINE 529 MG/ML IV SOLN COMPARISON:  08/08/2016 FINDINGS: MRI HEAD FINDINGS Brain: Moderate area of restricted diffusion in the central left corona radiata, measuring up to 2 cm. On coronal acquisition there is subtle asymmetric DWI hyperintensity in the left cortical spinal tracts attributed to acute [place]erian changes. No infarct elsewhere. No acute hemorrhage. There is chronic advanced microvascular ischemic gliosis in the cerebral white matter, deep gray nuclei, and pons. Remote hemorrhagic infarct affecting the right caudate head and anterior limb internal capsule. Brain volume is normal. No hydrocephalus or mass. No focal cortical finding to suggest a seizure focus. No chronic lobar hemorrhagic foci. Symmetric hippocampal formations when accounting for developmental cystic changes. No abnormal  intracranial enhancement. Vascular: Arterial findings below. Normal dural venous sinus flow voids. Skull and upper cervical spine: Negative Sinuses/Orbits: Bilateral cataract resection.  No acute finding MRA HEAD FINDINGS Symmetric and smooth carotid arteries. Intact circle-of-Willis. Focal moderate to advanced left M1 segment stenosis after the anterior temporal branch. Elsewhere the vessels are smooth and widely patent. No generalized beading or aneurysm. IMPRESSION: 1. Acute infarct in the central left corona radiata, nonhemorrhagic. 2. Focal high-grade left M1 segment stenosis. Elsewhere negative intracranial MRA. 3. 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 Electronically Signed   By: Monte Fantasia M.D.   On: 09/21/2016 12:02        Scheduled Meds: .  stroke: mapping our early stages of recovery book   Does not apply Once  . allopurinol  300 mg Oral Daily  .  amLODipine  5 mg Oral Daily  . atorvastatin  80 mg Oral QHS  . clopidogrel  75 mg Oral Daily  . maprotiline  50 mg Oral QHS  . OXcarbazepine  150 mg Oral Daily  . sodium bicarbonate  650 mg Oral BID  . topiramate  25 mg Oral QHS   Continuous Infusions:   LOS: 1 day    Time spent: 18 minutes    Milady Fleener, MD [facility] Pager [phone]  If 7PM-7AM, please contact night-coverage [email or web address] Password [email or web address] 09/22/2016, 1:24 PM

## 2016-09-22 NOTE — Procedures (Signed)
EEG Report 09/22/16  Referring physician- E Lindzen  Indication/History 66 female came in with episode of unresponsiveness  Medications cetaminophen (TYLENOL) solution 650 mg  allopurinol (ZYLOPRIM) tablet 300 mg  amLODipine (NORVASC) tablet 5 mg atorvastatin (LIPITOR) tablet 40 mg  clonazePAM (KLONOPIN) tablet 1 mg  clopidogrel (PLAVIX) tablet 75 mg  maprotiline (LUDIOMIL) tablet 50 mg  montelukast (SINGULAIR) tablet 10 mg  OXcarbazepine (TRILEPTAL) tablet 150 mg   Technical This is a multichannel digital EEG recording using the international 10-20 placement system.    Description of recording Posterior dominant rhythm is 8-9Hx symmetrical Sleep was not obtained Symmetrical slowing during drowsiness  Focal slowing or epileptiform features not seen  Impression The EEG is normal in awake and drowsy state.

## 2016-09-23 ENCOUNTER — Inpatient Hospital Stay (HOSPITAL_COMMUNITY): Payer: Medicare Other

## 2016-09-23 DIAGNOSIS — N390 Urinary tract infection, site not specified: Secondary | ICD-10-CM | POA: Diagnosis present

## 2016-09-23 DIAGNOSIS — R7303 Prediabetes: Secondary | ICD-10-CM | POA: Diagnosis present

## 2016-09-23 DIAGNOSIS — B9689 Other specified bacterial agents as the cause of diseases classified elsewhere: Secondary | ICD-10-CM | POA: Diagnosis present

## 2016-09-23 DIAGNOSIS — M48061 Spinal stenosis, lumbar region without neurogenic claudication: Secondary | ICD-10-CM

## 2016-09-23 DIAGNOSIS — B961 Klebsiella pneumoniae [K. pneumoniae] as the cause of diseases classified elsewhere: Secondary | ICD-10-CM

## 2016-09-23 LAB — BASIC METABOLIC PANEL
ANION GAP: 5 (ref 5–15)
BUN: 14 mg/dL (ref 6–20)
CHLORIDE: 111 mmol/L (ref 101–111)
CO2: 24 mmol/L (ref 22–32)
CREATININE: 0.97 mg/dL (ref 0.44–1.00)
Calcium: 8.6 mg/dL — ABNORMAL LOW (ref 8.9–10.3)
GFR calc non Af Amer: 56 mL/min — ABNORMAL LOW (ref >60)
Glucose, Bld: 105 mg/dL — ABNORMAL HIGH (ref 65–99)
Potassium: 3.4 mmol/L — ABNORMAL LOW (ref 3.5–5.1)
SODIUM: 140 mmol/L (ref 135–145)

## 2016-09-23 LAB — CBC
HCT: 37.2 % (ref 36.0–46.0)
HEMOGLOBIN: 11.8 g/dL — AB (ref 12.0–15.0)
MCH: 28 pg (ref 26.0–34.0)
MCHC: 31.7 g/dL (ref 30.0–36.0)
MCV: 88.4 fL (ref 78.0–100.0)
PLATELETS: 286 10*3/uL (ref 150–400)
RBC: 4.21 MIL/uL (ref 3.87–5.11)
RDW: 14.1 % (ref 11.5–15.5)
WBC: 6.2 10*3/uL (ref 4.0–10.5)

## 2016-09-23 LAB — HEMOGLOBIN A1C
HEMOGLOBIN A1C: 5.6 % (ref 4.8–5.6)
Mean Plasma Glucose: 114 mg/dL

## 2016-09-23 LAB — TROPONIN I
Troponin I: <0.03 ng/mL (ref <0.03)
Troponin I: <0.03 ng/mL (ref <0.03)
Troponin I: <0.03 ng/mL (ref <0.03)

## 2016-09-23 MED ORDER — DEXTROSE 5 % IV SOLN
1.0000 g | INTRAVENOUS | Status: DC
  Administered 2016-09-23: 1 g via INTRAVENOUS
  Filled 2016-09-23 (×2): qty 10

## 2016-09-23 MED ORDER — POTASSIUM CHLORIDE 20 MEQ PO PACK
40.0000 meq | PACK | Freq: Once | ORAL | Status: AC
  Administered 2016-09-23: 40 meq via ORAL
  Filled 2016-09-23: qty 2

## 2016-09-23 NOTE — Progress Notes (Signed)
STROKE TEAM PROGRESS NOTE   HISTORY OF PRESENT ILLNESS (per record) Savannah Travis is an 75 y.o. female with a history of hypertension, hyperlipidemia and depression who was brought to the emergency room by family following an episode of unresponsiveness while riding in a car with her family earlier today. She complained of headache prior to becoming unresponsive. She began to respond after about 10-15 minutes and appeared to be confused. Patient has no memory for this event. She's had 3 such events since 09/17/2016. As well patient's also complaining of progressive weakness and loss of ability to ambulate. Onset is unclear but symptoms of been markedly worse over the past few months. She initially was able to ambulate with a cane and subsequently with a walker. For the past months or so she has been totally unable to walk without assistance. She is unable to stand from a seated position without assistance, and is unable to turn over in bed. Posture is stooped on standing and her gait is described as shuffling, especially with the right lower extremity. She's had mild swallowing difficulty and tendency to drool. No tremors been noted. She had an MRI of her brain without contrast in mid November 2017 which was unremarkable. She reportedly is also had an EEG which was unremarkable. She was not symptomatic at the time of the EEG recording.    SUBJECTIVE (INTERVAL HISTORY) Her daughter is at the bedside.  We discussed the stroke work up in great detail.  She understands that the most significant chronic findings on the MRI were small vessel disease changes and the acute event, not the classic size of a lacunar infarct, is likely related to focal intracranial stenosis.  She stated understanding that a TEE would not likely add additional information.  We also discussed that a future finding of atrial fibrillation would potentially change the plan for evaluation and treatment.  We again reviewed portion control and  nutrition choices.  OBJECTIVE Temp:  [97.7 F (36.5 C)-98.7 F (37.1 C)] 98 F (36.7 C) (12/31 0500) Pulse Rate:  [70-80] 76 (12/31 0500) Cardiac Rhythm: Normal sinus rhythm (12/30 1900) Resp:  [20] 20 (12/31 0500) BP: (126-146)/(51-77) 132/63 (12/31 0500) SpO2:  [95 %-100 %] 95 % (12/31 0500)  CBC:   Recent Labs Lab 09/20/16 1651 09/22/16 0433 09/23/16 0444  WBC 8.2 7.1 6.2  NEUTROABS 5.6  --   --   HGB 12.8 11.2* 11.8*  HCT 40.3 36.1 37.2  MCV 89.6 88.7 88.4  PLT 292 273 [id number]    Basic Metabolic Panel:   Recent Labs Lab 09/20/16 2212 09/22/16 0433 09/23/16 0444  NA  --  141 140  K  --  3.5 3.4*  CL  --  111 111  CO2  --  18* 24  GLUCOSE  --  110* 105*  BUN  --  13 14  CREATININE  --  1.06* 0.97  CALCIUM  --  8.6* 8.6*  MG 1.8  --   --   PHOS 2.9  --   --     Lipid Panel:     Component Value Date/Time   CHOL 156 09/22/2016 0433   TRIG 111 09/22/2016 0433   HDL 47 09/22/2016 0433   CHOLHDL 3.3 09/22/2016 0433   VLDL 22 09/22/2016 0433   LDLCALC 87 09/22/2016 0433   HgbA1c:  Lab Results  Component Value Date   HGBA1C 6.2 (H) 09/21/2016   Urine Drug Screen: No results found for: LABOPIA, [place], LABBENZ, AMPHETMU, THCU, LABBARB  IMAGING C-Spine MRI 09/22/2016 Foramen magnum and C1-2: Normal.  C2-3: Facet osteoarthritis without edema. No disc pathology. No significant stenosis.  C3-4: Facet osteoarthritis without edema. No disc pathology. No significant stenosis.  C4-5: Facet arthropathy on the left without edema. Mild bulging of the disc. No compressive canal or foraminal narrowing.  C5-6: Facet arthropathy on the left without edema. Moderate bulging of the disc. Narrowing of the ventral subarachnoid space but no compression of the cord. No significant foraminal narrowing.  C6-7:  Normal interspace.  C7-T1: Bulging of the disc more towards the left. No apparent neural compression. Facet arthropathy on the left without  edema.  T1-2: Bilateral facet osteoarthritis with 3 mm of anterolisthesis. No compressive stenosis.  MRI Lumbar spine Alignment:  Mild curvature convex to the right with the apex at L3.  Vertebrae: Old compression fracture at L5 with loss of height of 50%. Posterior bowing of the posterosuperior margin of the vertebral body . No other focal lumbar lesion.  Conus medullaris: Extends to the T12 level and appears normal.  Paraspinal and other soft tissues: Negative  Disc levels:  Mild bulging of the discs from T9-10 through L1-2. No compressive narrowing of the canal or foramina.  L2-3: Disc degeneration with endplate osteophytes and bulging of the disc. This is more prominent towards the right. Mild stenosis of the lateral recesses and foramina right more than left. No definite neural compression.  L3-4: Mild bulging of the disc. Mild facet hypertrophy. No stenosis.  L4-5: Bulging of the disc. Posterior bowing of the posterosuperior margin of the vertebral body. This encroaches upon the spinal canal. Facet and ligamentous hypertrophy. Moderate stenosis at this level that could possibly be symptomatic.  L5-S1: Bulging of the disc. Bilateral facet arthropathy with gaping, fluid-filled facet joints. Anterolisthesis could occur at this level with standing or flexion. Narrowing of the subarticular lateral recesses and to a lesser degree the neural foramina. This could possibly be significant, particularly with standing.    Dg Chest 2 View 09/20/2016 No active cardiopulmonary disease.   Mr Jodene Nam Head/brain Wo Cm 09/21/2016 1. Acute infarct in the central left corona radiata, nonhemorrhagic.  2. Focal high-grade left M1 segment stenosis. Elsewhere negative intracranial MRA.  3. 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    PHYSICAL EXAM  HEENT-  Normocephalic, no lesions, without obvious abnormality.  Normal external eye  and conjunctiva.  Normal external nose, mucus membranes and septum.  Normal pharynx. Neck supple with no masses, nodes, nodules or enlargement. Cardiovascular - regular rate and rhythm, S1, S2 normal, no murmur, click, rub or gallop Lungs - chest clear, no wheezing, rales, normal symmetric air entry Abdomen - soft, non-tender; bowel sounds normal; no masses,  no organomegaly Extremities - no joint deformities, effusion, or inflammation  Neurologic Examination: Mental Status: Alert, oriented 3, no acute distress.  Speech slightly slurred with low volume, without evidence of aphasia. Able to follow commands without difficulty. Cranial Nerves: II-Visual fields were normal. III/IV/VI-Pupils were equal and reacted normally to light. Extraocular movements were full and conjugate.    V/VII-no facial numbness and no facial weakness. VIII-normal. X-minimal dysarthria. XI: trapezius strength/neck flexion strength normal bilaterally XII-midline tongue extension with normal strength. Motor: Patient has been told she may have early Parkinson's.  She does not have the classic pill-rolling tremor at rest.  In addition, on exam today, her increased tone is more consistent with paratonia than true cogwheel rigidity.  I observed her for some time with her  meal and she does have clear bradykinesia.  In addition, her facial expressions are not very dynamic and there may be a component of PD facies.  Patient does have bilateral limitations in hip flexion.  While unclear if this is partially related related to body habitus versus pure weakness, the MRI findings and the history of urinary incontinence and progressive trouble walking for the last month will be discussed with neurosurgery.  Sensory: Normal light touch throughout. Plantars: Flexor bilaterally Cerebellar: Normal finger-to-nose testing.  ASSESSMENT/PLAN Ms. Savannah Travis is a 75 y.o. female with history of hypertension, breast cancer, hyperlipidemia  and depression  presenting with unresponsive episodes, dysphagia, confusion, and progressive weakness. She did not receive IV t-PA due to unknown time of onset.  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  Carotid Doppler - 08/08/2016 - normal carotid and vertebral arteries.  EEG:  Without abnormality.  Performed because daughter described 3 episodes >15 minutes of trouble talking during the course of the year.  Patient stated there were probably more  2D Echo - Left ventricle: The cavity size was normal. Systolic function was normal. The estimated ejection fraction was in the range of 60%  to 65%. Wall motion was normal; there were no regional wall motion abnormalities. Doppler parameters are consistent with abnormal left ventricular relaxation (grade 1 diastolic  dysfunction). Compared to the prior study, there has been no significant interval change.  LDL - 87  HgbA1c pending  VTE prophylaxis - SCDs Diet heart healthy/carb modified Room service appropriate? Yes; Fluid consistency: Thin Diet NPO time specified Except for: Sips with Meds  aspirin 81 mg daily prior to admission, now on clopidogrel 75 mg daily  Patient counseled to be compliant with her antithrombotic medications  Ongoing aggressive stroke risk factor management  Therapy recommendations: Inpatient rehabilitation recommended. M.D. consult pending.  Disposition: Pending  Hypertension  Stable  Permissive hypertension (OK if < 220/120) but gradually normalize in 5-7 days  Long-term BP goal normotensive  Hyperlipidemia  Home meds: Lipitor 40 mg daily resumed in hospital  LDL 87, goal < 70  Increase Lipitor to 80 mg daily  Continue statin at discharge   Other Stroke Risk Factors  Advanced age  Obesity, Body mass index is  46.31 kg/m., recommend weight loss, diet and exercise as appropriate    Other Active Problems  Creatinine - 1.06  Hospital day # 2  ATTENDING NOTE: Patient was seen and examined by me personally. Documentation reflects findings. The laboratory and radiographic studies reviewed by me. ROS completed by me personally and pertinent positives fully documented  Condition: unchanged  Assessment and plan completed by me personally and fully documented above. Plans/Recommendations include:     History of lower extremity weakness that has progressed over the month is concerning given L-spine MRI results above. According to daughter, patient has progressed from cane use, to walker to wheelchair.  Apparently when she stands, her legs seem to become "progressively weaker"  In addition, urinary incontinence has been an issue over the last month.   Will contact Neurosurgery on call for consult  Patient has had worsening challenges with her upper extremity strength.  Cspine reviewed.  No apparent advanced disease that requires neurosurgical intervention.  Will include in the discussion with neurosurgery for agreement  Stroke work-up completed.  Will follow until Neurosurgery  has completed evaluation.   Patient would benefit from diabetic education with daughter present  Once Neurosurgery evaluation completed, if okay'd by neurosurgery consult team, would agree to rehab as next step     To contact Stroke Continuity provider, please refer to [email or web address] After hours, contact General Neurology

## 2016-09-23 NOTE — Progress Notes (Signed)
Dr Belenda Cruise spoke with Dr. Kathyrn Sheriff about the patient's abnormal Lumbar MRI. Dr. Kathyrn Sheriff reviewed images and  recommended a thoracic MRI which has been ordered. They will review and see pt in consult only if significant findings on thoracic MRI.  Mikey Bussing PA-C [facility] Pager [phone] 09/23/2016, 3:59 PM

## 2016-09-23 NOTE — Progress Notes (Signed)
PROGRESS NOTE    Rayelle Steinmann  [id number] DOB: Jun 25, 1941 DOA: 09/20/2016 PCP: Keane Police, MD    Brief Narrative: Seward Speck an 75 y.o.femalewith a history of hypertension, hyperlipidemia and depression who was brought to the emergency room by family following an episode of unresponsiveness while riding in a car with her family earlier today. She complained of headache prior to becoming unresponsive. She began to respond after about 10-15 minutes and appeared to be confused. Patient has no memory for this event. She's had 3 such events since 09/17/2016. As well patient's also complaining of progressive weakness and loss of ability to ambulate which happened suddenly on Dec 15th. Recent outpatient stress test was low risk.      Assessment & Plan:   Principal Problem:   CVA (cerebral vascular accident) ([place]) Active Problems:   Unresponsiveness   Hypokalemia   Debility   Dehydration   Prolonged QT interval   Hypertension   Acute encephalopathy   Dysarthria   UTI due to Klebsiella species   Pre-diabetes  #1 acute CVA MRI patient with acute CVA in the left corona radiata, MRA with focal high-grade left M1 segment stenosis. Felt likely secondary small vessel disease. Patient with carotid Dopplers 08/08/2016 with no significant ICA stenosis. EEG done without any abnormality. 2-D echo with EF of 123456, grade 1 diastolic dysfunction, no source of emboli noted. LDL of 87. Hemoglobin A1c was 6.2 on 09/21/2016. Neurology recommended probable TEE which per family has been scheduled for later on today.  Patient was on aspirin prior to admission currently on Plavix for secondary stroke prevention. Neurology following and appreciate input and recommendations.  #2 acute encephalopathy Likely secondary to problem #1 and possible progressive Parkinson's disease. Patient has been started on Sinemet per neurology. Follow.  #3 hypokalemia Replete.  #4 hypertension Stable. Continue  current regimen of Norvasc. Follow.  #5 acidosis Resolved with bicarbonate tablets. Discontinue bicarbonate tablets.   #6 Klebsiella pneumonia UTI Sensitivities pending. Place on IV Rocephin.  #7 pre- diabetes Hemoglobin A1c was 6.2 on 09/21/2016. Outpatient follow-up with PCP.    DVT prophylaxis: SCDs Code Status: DO NOT RESUSCITATE Family Communication: Updated patient and daughter at bedside. Disposition Plan: CIR when workup complete.   Consultants:   Neurology: Dr. Nicole Kindred 09/20/2016  Procedures:   2-D echo 09/22/2016  Chest x-ray 09/20/2016  MRI/MRA brain 09/21/2016  MRI CNL spine 09/22/2016  EEG 09/22/2016  TEE pending  Antimicrobials:    IV Rocephin 09/23/2016   Subjective: Patient denies any chest pain. No shortness of breath. Patient with complaints of s dysuria per daughter. No syncopal episodes. Per daughter weakness has been noted drastically since December 15.  Objective: Vitals:   09/22/16 1729 09/22/16 2142 09/23/16 0159 09/23/16 0500  BP: (!) 146/77 (!) 131/56 (!) 127/51 132/63  Pulse: 79 80 78 76  Resp: 20 20 20 20   Temp: 98.1 F (36.7 C) 98.4 F (36.9 C) 97.7 F (36.5 C) 98 F (36.7 C)  TempSrc: Oral Oral Oral Oral  SpO2: 97% 96% 95% 95%  Weight:      Height:       No intake or output data in the 24 hours ending 09/23/16 1148 Filed Weights   09/21/16 0006 09/22/16 0500  Weight: 87.9 kg (193 lb 12.6 oz) 93.7 kg (206 lb 9.1 oz)    Examination:  General exam: Appears calm and comfortable  Respiratory system: Clear to auscultation. Respiratory effort normal. Cardiovascular system: S1 & S2 heard, RRR. No JVD, murmurs, rubs, gallops or  clicks. No pedal edema. Gastrointestinal system: Abdomen is nondistended, soft and nontender. No organomegaly or masses felt. Normal bowel sounds heard. Central nervous system: Alert and oriented. No focal neurological deficits. Extremities: 4/5 bilateral lower extremity strength,4/5 right upper  extremity strength,5/5 left upper extremity strength. Skin: No rashes, lesions or ulcers Psychiatry: Judgement and insight appear normal. Mood & affect appropriate.     Data Reviewed: I have personally reviewed following labs and imaging studies  CBC:  Recent Labs Lab 09/20/16 1651 09/22/16 0433 09/23/16 0444  WBC 8.2 7.1 6.2  NEUTROABS 5.6  --   --   HGB 12.8 11.2* 11.8*  HCT 40.3 36.1 37.2  MCV 89.6 88.7 88.4  PLT 292 273 [id number]   Basic Metabolic Panel:  Recent Labs Lab 09/20/16 1651 09/20/16 2212 09/22/16 0433 09/23/16 0444  NA 140  --  141 140  K 3.2*  --  3.5 3.4*  CL 108  --  111 111  CO2 20*  --  18* 24  GLUCOSE 174*  --  110* 105*  BUN 11  --  13 14  CREATININE 1.00  --  1.06* 0.97  CALCIUM 9.3  --  8.6* 8.6*  MG  --  1.8  --   --   PHOS  --  2.9  --   --    GFR: Estimated Creatinine Clearance: 46.9 mL/min (by C-G formula based on SCr of 0.97 mg/dL). Liver Function Tests:  Recent Labs Lab 09/20/16 1651  AST 21  ALT 14  ALKPHOS 96  BILITOT 0.3  PROT 6.9  ALBUMIN 3.8   No results for input(s): LIPASE, AMYLASE in the last 168 hours.  Recent Labs Lab 09/20/16 2212  AMMONIA 32   Coagulation Profile: No results for input(s): INR, PROTIME in the last 168 hours. Cardiac Enzymes:  Recent Labs Lab 09/20/16 2212  09/22/16 1017 09/22/16 1833 09/22/16 2301 09/23/16 0444 09/23/16 1019  CKTOTAL 69  --   --   --   --   --   --   TROPONINI <0.03  < > <0.03 <0.03 <0.03 <0.03 <0.03  < > = values in this interval not displayed. BNP (last 3 results) No results for input(s): PROBNP in the last 8760 hours. HbA1C:  Recent Labs  09/21/16 0531  HGBA1C 6.2*   CBG:  Recent Labs Lab 09/20/16 1633  GLUCAP 181*   Lipid Profile:  Recent Labs  09/21/16 0531 09/22/16 0433  CHOL 155 156  HDL 47 47  LDLCALC 78 87  TRIG 148 111  CHOLHDL 3.3 3.3   Thyroid Function Tests: No results for input(s): TSH, T4TOTAL, FREET4, T3FREE, THYROIDAB in the  last 72 hours. Anemia Panel: No results for input(s): VITAMINB12, FOLATE, FERRITIN, TIBC, IRON, RETICCTPCT in the last 72 hours. Sepsis Labs:  Recent Labs Lab 09/20/16 1657 09/20/16 2212  LATICACIDVEN 2.37* 0.8    Recent Results (from the past 240 hour(s))  Culture, Urine     Status: Abnormal (Preliminary result)   Collection Time: 09/20/16  8:21 PM  Result Value Ref Range Status   Specimen Description URINE, RANDOM  Final   Special Requests NONE  Final   Culture >=100,000 COLONIES/mL KLEBSIELLA PNEUMONIAE (A)  Final   Report Status PENDING  Incomplete         Radiology Studies: Mr Cervical Spine Wo Contrast  Result Date: 09/22/2016 CLINICAL DATA:  Lower extremity weakness worsening over the last month. EXAM: MRI CERVICAL SPINE WITHOUT CONTRAST TECHNIQUE: Multiplanar, multisequence MR imaging of the cervical  spine was performed. No intravenous contrast was administered. COMPARISON:  08/08/2016 FINDINGS: Alignment: Straightening and slight kyphotic curvature. 3 mm anterolisthesis T1 to due to facet degeneration. Vertebrae: No fracture or significant bone lesion. Cord: No cord compression or primary cord lesion. Posterior Fossa, vertebral arteries, paraspinal tissues: Negative Disc levels: Foramen magnum and C1-2: Normal. C2-3: Facet osteoarthritis without edema. No disc pathology. No significant stenosis. C3-4: Facet osteoarthritis without edema. No disc pathology. No significant stenosis. C4-5: Facet arthropathy on the left without edema. Mild bulging of the disc. No compressive canal or foraminal narrowing. C5-6: Facet arthropathy on the left without edema. Moderate bulging of the disc. Narrowing of the ventral subarachnoid space but no compression of the cord. No significant foraminal narrowing. C6-7:  Normal interspace. C7-T1: Bulging of the disc more towards the left. No apparent neural compression. Facet arthropathy on the left without edema. T1-2: Bilateral facet osteoarthritis  with 3 mm of anterolisthesis. No compressive stenosis. IMPRESSION: No acute or likely significant finding in the cervical region. No change since the study of November. Degenerative facet arthropathy left worse than right. Mild disc degeneration. No compressive stenosis of the canal or foramina. No primary cord lesion. Electronically Signed   By: Nelson Chimes M.D.   On: 09/22/2016 17:01   Mr Lumbar Spine Wo Contrast  Result Date: 09/22/2016 CLINICAL DATA:  Lower extremity weakness worsening over the last month. EXAM: MRI LUMBAR SPINE WITHOUT CONTRAST TECHNIQUE: Multiplanar, multisequence MR imaging of the lumbar spine was performed. No intravenous contrast was administered. COMPARISON:  None. FINDINGS: Segmentation:  5 lumbar type vertebral bodies. Alignment:  Mild curvature convex to the right with the apex at L3. Vertebrae: Old compression fracture at L5 with loss of height of 50%. Posterior bowing of the posterosuperior margin of the vertebral body . No other focal lumbar lesion. Conus medullaris: Extends to the T12 level and appears normal. Paraspinal and other soft tissues: Negative Disc levels: Mild bulging of the discs from T9-10 through L1-2. No compressive narrowing of the canal or foramina. L2-3: Disc degeneration with endplate osteophytes and bulging of the disc. This is more prominent towards the right. Mild stenosis of the lateral recesses and foramina right more than left. No definite neural compression. L3-4: Mild bulging of the disc. Mild facet hypertrophy. No stenosis. L4-5: Bulging of the disc. Posterior bowing of the posterosuperior margin of the vertebral body. This encroaches upon the spinal canal. Facet and ligamentous hypertrophy. Moderate stenosis at this level that could possibly be symptomatic. L5-S1: Bulging of the disc. Bilateral facet arthropathy with gaping, fluid-filled facet joints. Anterolisthesis could occur at this level with standing or flexion. Narrowing of the subarticular  lateral recesses and to a lesser degree the neural foramina. This could possibly be significant, particularly with standing. IMPRESSION: No likely significant finding at L3-4 or above. Old compression fracture at L5 which is healed. Posterior bowing of the posterosuperior margin of the vertebral body. Mild bulging of the L4-5 disc. In combination with the L5 bowing hand with bilateral facet and ligamentous hypertrophy, there is moderate stenosis at this level that could possibly cause neural compression. L5-S1 facet arthropathy with gaping, fluid-filled facet joints. Mild bulging of the disc. Mild narrowing of the subarticular lateral recesses and neural foramina. This appearance would likely worsen with standing or flexion based on the appearance of the facet arthropathy. Electronically Signed   By: Nelson Chimes M.D.   On: 09/22/2016 17:05        Scheduled Meds: .  stroke: mapping  our early stages of recovery book   Does not apply Once  . allopurinol  300 mg Oral Daily  . amLODipine  5 mg Oral Daily  . atorvastatin  80 mg Oral QHS  . cefTRIAXone (ROCEPHIN)  IV  1 g Intravenous Q24H  . clopidogrel  75 mg Oral Daily  . maprotiline  50 mg Oral QHS  . OXcarbazepine  150 mg Oral Daily  . potassium chloride  40 mEq Oral Once  . sodium bicarbonate  650 mg Oral BID  . topiramate  25 mg Oral QHS   Continuous Infusions:   LOS: 2 days    Time spent: 35 minutes    THOMPSON,DANIEL, MD [facility] Pager [phone]  If 7PM-7AM, please contact night-coverage [email or web address] Password [email or web address] 09/23/2016, 11:48 AM

## 2016-09-24 DIAGNOSIS — I63312 Cerebral infarction due to thrombosis of left middle cerebral artery: Secondary | ICD-10-CM

## 2016-09-24 DIAGNOSIS — R269 Unspecified abnormalities of gait and mobility: Secondary | ICD-10-CM

## 2016-09-24 LAB — TROPONIN I: Troponin I: <0.03 ng/mL (ref <0.03)

## 2016-09-24 LAB — URINE CULTURE

## 2016-09-24 LAB — BASIC METABOLIC PANEL
ANION GAP: 11 (ref 5–15)
BUN: 18 mg/dL (ref 6–20)
CALCIUM: 8.8 mg/dL — AB (ref 8.9–10.3)
CO2: 19 mmol/L — ABNORMAL LOW (ref 22–32)
CREATININE: 1.09 mg/dL — AB (ref 0.44–1.00)
Chloride: 110 mmol/L (ref 101–111)
GFR calc non Af Amer: 48 mL/min — ABNORMAL LOW (ref >60)
GFR, EST AFRICAN AMERICAN: 56 mL/min — AB (ref >60)
Glucose, Bld: 113 mg/dL — ABNORMAL HIGH (ref 65–99)
Potassium: 3.8 mmol/L (ref 3.5–5.1)
SODIUM: 140 mmol/L (ref 135–145)

## 2016-09-24 MED ORDER — ATORVASTATIN CALCIUM 40 MG PO TABS
40.0000 mg | ORAL_TABLET | Freq: Every day | ORAL | Status: DC
  Administered 2016-09-24: 40 mg via ORAL
  Filled 2016-09-24: qty 1

## 2016-09-24 MED ORDER — ASPIRIN EC 325 MG PO TBEC
325.0000 mg | DELAYED_RELEASE_TABLET | Freq: Every day | ORAL | Status: DC
  Administered 2016-09-24 – 2016-09-25 (×2): 325 mg via ORAL
  Filled 2016-09-24 (×2): qty 1

## 2016-09-24 MED ORDER — OXCARBAZEPINE 300 MG PO TABS
300.0000 mg | ORAL_TABLET | Freq: Two times a day (BID) | ORAL | Status: DC
  Administered 2016-09-24 – 2016-09-25 (×2): 300 mg via ORAL
  Filled 2016-09-24 (×3): qty 1

## 2016-09-24 MED ORDER — TOPIRAMATE 25 MG PO TABS
25.0000 mg | ORAL_TABLET | Freq: Two times a day (BID) | ORAL | Status: DC
  Administered 2016-09-24 – 2016-09-25 (×2): 25 mg via ORAL
  Filled 2016-09-24 (×2): qty 1

## 2016-09-24 MED ORDER — SODIUM BICARBONATE 650 MG PO TABS
650.0000 mg | ORAL_TABLET | Freq: Two times a day (BID) | ORAL | Status: DC
  Administered 2016-09-24 – 2016-09-25 (×3): 650 mg via ORAL
  Filled 2016-09-24 (×3): qty 1

## 2016-09-24 MED ORDER — CEFUROXIME AXETIL 500 MG PO TABS
500.0000 mg | ORAL_TABLET | Freq: Two times a day (BID) | ORAL | Status: DC
  Administered 2016-09-24 – 2016-09-25 (×3): 500 mg via ORAL
  Filled 2016-09-24 (×3): qty 1

## 2016-09-24 NOTE — Progress Notes (Signed)
PROGRESS NOTE    Atia Kolanowski  [id number] DOB: 1941/04/02 DOA: 09/20/2016 PCP: Keane Police, MD    Brief Narrative: Seward Speck an 76 y.o.femalewith a history of hypertension, hyperlipidemia and depression who was brought to the emergency room by family following an episode of unresponsiveness while riding in a car with her family earlier today. She complained of headache prior to becoming unresponsive. She began to respond after about 10-15 minutes and appeared to be confused. Patient has no memory for this event. She's had 3 such events since 09/17/2016. As well patient's also complaining of progressive weakness and loss of ability to ambulate which happened suddenly on Dec 15th. Recent outpatient stress test was low risk.      Assessment & Plan:   Principal Problem:   CVA (cerebral vascular accident) ([place]) Active Problems:   Unresponsiveness   Hypokalemia   Debility   Dehydration   Prolonged QT interval   Hypertension   Acute encephalopathy   Dysarthria   UTI due to Klebsiella species   Pre-diabetes   Spinal stenosis of lumbar region without neurogenic claudication  #1 acute CVA MRI patient with acute CVA in the left corona radiata, MRA with focal high-grade left M1 segment stenosis. Felt likely secondary small vessel disease. Patient with carotid Dopplers 08/08/2016 with no significant ICA stenosis. EEG done without any abnormality. 2-D echo with EF of 123456, grade 1 diastolic dysfunction, no source of emboli noted. LDL of 87. Hemoglobin A1c was 6.2 on 09/21/2016. Neurology initially recommended probable TEE, however stroke MD felt CVA was more likely secondary to small vessel disease and as such TEE not needed at this time. TEE has been cancelled. Patient was on aspirin prior to admission currently on Plavix for secondary stroke prevention. Neurology following and appreciate input and recommendations.  #2 acute encephalopathy Likely secondary to problem #1 and  possible progressive Parkinson's disease. Patient has been started on Sinemet per neurology. Follow.  #3 hypokalemia Repleted.  #4 hypertension Permissive hypertension. Follow.  #5 acidosis Place on bicarbonate tablets.  #6 Klebsiella pneumoniae UTI Change IV Rocephin to oral ceftin to complete course of antibiotics.  #7 ?? Pre- diabetes Repeat HgbA1C = 5.6. Outpatient follow up.    DVT prophylaxis: SCDs Code Status: DO NOT RESUSCITATE Family Communication: Updated patient and daughter at bedside. Disposition Plan: CIR when bed available and per neurology.    Consultants:   Neurology: Dr. Nicole Kindred 09/20/2016  Procedures:   2-D echo 09/22/2016  Chest x-ray 09/20/2016  MRI/MRA brain 09/21/2016  MRI CNL spine 09/22/2016  EEG 09/22/2016  MRI T spine 09/23/2016  Antimicrobials:    IV Rocephin 09/23/2016>>>>09/24/2016  Oral ceftin 09/24/2016   Subjective: Patient denies any chest pain. No shortness of breath. Patient with complaints of some right-sided upper extremity and lower extremity weakness. No syncopal episodes. Patient states slept very well last night.  Objective: Vitals:   09/23/16 2100 09/24/16 0229 09/24/16 0516 09/24/16 0901  BP: (!) 132/53 140/69 118/68 (!) 138/59  Pulse: 94 84 79 83  Resp: 16 16 18 20   Temp: 97.7 F (36.5 C) 97.5 F (36.4 C) 97.6 F (36.4 C) 98 F (36.7 C)  TempSrc: Oral Oral Oral Oral  SpO2: 96% 95% 96% 96%  Weight:      Height:        Intake/Output Summary (Last 24 hours) at 09/24/16 1055 Last data filed at 09/24/16 0835  Gross per 24 hour  Intake              170  ml  Output                0 ml  Net              170 ml   Filed Weights   09/21/16 0006 09/22/16 0500  Weight: 87.9 kg (193 lb 12.6 oz) 93.7 kg (206 lb 9.1 oz)    Examination:  General exam: Appears calm and comfortable  Respiratory system: Clear to auscultation. Respiratory effort normal. Cardiovascular system: S1 & S2 heard, RRR. No JVD, murmurs,  rubs, gallops or clicks. No pedal edema. Gastrointestinal system: Abdomen is nondistended, soft and nontender. No organomegaly or masses felt. Normal bowel sounds heard. Central nervous system: Alert and oriented. No focal neurological deficits. Extremities: 4/5 bilateral lower extremity strength,4/5 right upper extremity strength,5/5 left upper extremity strength. Skin: No rashes, lesions or ulcers Psychiatry: Judgement and insight appear normal. Mood & affect appropriate.     Data Reviewed: I have personally reviewed following labs and imaging studies  CBC:  Recent Labs Lab 09/20/16 1651 09/22/16 0433 09/23/16 0444  WBC 8.2 7.1 6.2  NEUTROABS 5.6  --   --   HGB 12.8 11.2* 11.8*  HCT 40.3 36.1 37.2  MCV 89.6 88.7 88.4  PLT 292 273 Q000111Q   Basic Metabolic Panel:  Recent Labs Lab 09/20/16 1651 09/20/16 2212 09/22/16 0433 09/23/16 0444 09/24/16 0452  NA 140  --  141 140 140  K 3.2*  --  3.5 3.4* 3.8  CL 108  --  111 111 110  CO2 20*  --  18* 24 19*  GLUCOSE 174*  --  110* 105* 113*  BUN 11  --  13 14 18   CREATININE 1.00  --  1.06* 0.97 1.09*  CALCIUM 9.3  --  8.6* 8.6* 8.8*  MG  --  1.8  --   --   --   PHOS  --  2.9  --   --   --    GFR: Estimated Creatinine Clearance: 41.7 mL/min (by C-G formula based on SCr of 1.09 mg/dL (H)). Liver Function Tests:  Recent Labs Lab 09/20/16 1651  AST 21  ALT 14  ALKPHOS 96  BILITOT 0.3  PROT 6.9  ALBUMIN 3.8   No results for input(s): LIPASE, AMYLASE in the last 168 hours.  Recent Labs Lab 09/20/16 2212  AMMONIA 32   Coagulation Profile: No results for input(s): INR, PROTIME in the last 168 hours. Cardiac Enzymes:  Recent Labs Lab 09/20/16 2212  09/23/16 0444 09/23/16 1019 09/23/16 1523 09/23/16 2238 09/24/16 0452  CKTOTAL 69  --   --   --   --   --   --   TROPONINI <0.03  < > <0.03 <0.03 <0.03 <0.03 <0.03  < > = values in this interval not displayed. BNP (last 3 results) No results for input(s): PROBNP  in the last 8760 hours. HbA1C:  Recent Labs  09/22/16 0433  HGBA1C 5.6   CBG:  Recent Labs Lab 09/20/16 1633  GLUCAP 181*   Lipid Profile:  Recent Labs  09/22/16 0433  CHOL 156  HDL 47  LDLCALC 87  TRIG 111  CHOLHDL 3.3   Thyroid Function Tests: No results for input(s): TSH, T4TOTAL, FREET4, T3FREE, THYROIDAB in the last 72 hours. Anemia Panel: No results for input(s): VITAMINB12, FOLATE, FERRITIN, TIBC, IRON, RETICCTPCT in the last 72 hours. Sepsis Labs:  Recent Labs Lab 09/20/16 1657 09/20/16 2212  LATICACIDVEN 2.37* 0.8    Recent Results (from the  past 240 hour(s))  Culture, Urine     Status: Abnormal   Collection Time: 09/20/16  8:21 PM  Result Value Ref Range Status   Specimen Description URINE, RANDOM  Final   Special Requests NONE  Final   Culture >=100,000 COLONIES/mL KLEBSIELLA PNEUMONIAE (A)  Final   Report Status 09/24/2016 FINAL  Final   Organism ID, Bacteria KLEBSIELLA PNEUMONIAE (A)  Final      Susceptibility   Klebsiella pneumoniae - MIC*    AMPICILLIN >=32 RESISTANT Resistant     CEFAZOLIN <=4 SENSITIVE Sensitive     CEFTRIAXONE <=1 SENSITIVE Sensitive     CIPROFLOXACIN <=0.25 SENSITIVE Sensitive     GENTAMICIN <=1 SENSITIVE Sensitive     IMIPENEM <=0.25 SENSITIVE Sensitive     NITROFURANTOIN 64 INTERMEDIATE Intermediate     TRIMETH/SULFA <=20 SENSITIVE Sensitive     AMPICILLIN/SULBACTAM 4 SENSITIVE Sensitive     PIP/TAZO <=4 SENSITIVE Sensitive     Extended ESBL NEGATIVE Sensitive     * >=100,000 COLONIES/mL KLEBSIELLA PNEUMONIAE         Radiology Studies: Mr Cervical Spine Wo Contrast  Result Date: 09/22/2016 CLINICAL DATA:  Lower extremity weakness worsening over the last month. EXAM: MRI CERVICAL SPINE WITHOUT CONTRAST TECHNIQUE: Multiplanar, multisequence MR imaging of the cervical spine was performed. No intravenous contrast was administered. COMPARISON:  08/08/2016 FINDINGS: Alignment: Straightening and slight kyphotic  curvature. 3 mm anterolisthesis T1 to due to facet degeneration. Vertebrae: No fracture or significant bone lesion. Cord: No cord compression or primary cord lesion. Posterior Fossa, vertebral arteries, paraspinal tissues: Negative Disc levels: Foramen magnum and C1-2: Normal. C2-3: Facet osteoarthritis without edema. No disc pathology. No significant stenosis. C3-4: Facet osteoarthritis without edema. No disc pathology. No significant stenosis. C4-5: Facet arthropathy on the left without edema. Mild bulging of the disc. No compressive canal or foraminal narrowing. C5-6: Facet arthropathy on the left without edema. Moderate bulging of the disc. Narrowing of the ventral subarachnoid space but no compression of the cord. No significant foraminal narrowing. C6-7:  Normal interspace. C7-T1: Bulging of the disc more towards the left. No apparent neural compression. Facet arthropathy on the left without edema. T1-2: Bilateral facet osteoarthritis with 3 mm of anterolisthesis. No compressive stenosis. IMPRESSION: No acute or likely significant finding in the cervical region. No change since the study of November. Degenerative facet arthropathy left worse than right. Mild disc degeneration. No compressive stenosis of the canal or foramina. No primary cord lesion. Electronically Signed   By: Nelson Chimes M.D.   On: 09/22/2016 17:01   Mr Thoracic Spine Wo Contrast  Result Date: 09/23/2016 CLINICAL DATA:  Weakness and inability to ambulate. EXAM: MRI THORACIC SPINE WITHOUT CONTRAST TECHNIQUE: Multiplanar, multisequence MR imaging of the thoracic spine was performed. No intravenous contrast was administered. COMPARISON:  Cervical and lumbar spine MRI examinations from yesterday. FINDINGS: Alignment:  Scoliosis but normal alignment in the sagittal plane. Vertebrae: Normal marrow signal except for a few small scattered hemangiomas. Mild endplate reactive changes are noted at T12-L1. Cord: Normal signal intensity. No cord  lesions or syrinx. The conus medullaris is normal. Paraspinal and other soft tissues: No significant findings. Disc levels: Shallow disc protrusion at T2-3 with mild flattening of the ventral thecal sac. No other significant disc protrusions, spinal or foraminal stenosis involving the thoracic spine. Minimal disc bulges at T11-12 and T12-L1. IMPRESSION: Thoracic scoliosis. Shallow disc protrusion at T2-3 without significant neural compression. No other disc protrusions, spinal or foraminal stenosis in the thoracic  spine. Normal MR appearance of the thoracic spinal cord. Electronically Signed   By: Marijo Sanes M.D.   On: 09/23/2016 18:44   Mr Lumbar Spine Wo Contrast  Result Date: 09/22/2016 CLINICAL DATA:  Lower extremity weakness worsening over the last month. EXAM: MRI LUMBAR SPINE WITHOUT CONTRAST TECHNIQUE: Multiplanar, multisequence MR imaging of the lumbar spine was performed. No intravenous contrast was administered. COMPARISON:  None. FINDINGS: Segmentation:  5 lumbar type vertebral bodies. Alignment:  Mild curvature convex to the right with the apex at L3. Vertebrae: Old compression fracture at L5 with loss of height of 50%. Posterior bowing of the posterosuperior margin of the vertebral body . No other focal lumbar lesion. Conus medullaris: Extends to the T12 level and appears normal. Paraspinal and other soft tissues: Negative Disc levels: Mild bulging of the discs from T9-10 through L1-2. No compressive narrowing of the canal or foramina. L2-3: Disc degeneration with endplate osteophytes and bulging of the disc. This is more prominent towards the right. Mild stenosis of the lateral recesses and foramina right more than left. No definite neural compression. L3-4: Mild bulging of the disc. Mild facet hypertrophy. No stenosis. L4-5: Bulging of the disc. Posterior bowing of the posterosuperior margin of the vertebral body. This encroaches upon the spinal canal. Facet and ligamentous hypertrophy.  Moderate stenosis at this level that could possibly be symptomatic. L5-S1: Bulging of the disc. Bilateral facet arthropathy with gaping, fluid-filled facet joints. Anterolisthesis could occur at this level with standing or flexion. Narrowing of the subarticular lateral recesses and to a lesser degree the neural foramina. This could possibly be significant, particularly with standing. IMPRESSION: No likely significant finding at L3-4 or above. Old compression fracture at L5 which is healed. Posterior bowing of the posterosuperior margin of the vertebral body. Mild bulging of the L4-5 disc. In combination with the L5 bowing hand with bilateral facet and ligamentous hypertrophy, there is moderate stenosis at this level that could possibly cause neural compression. L5-S1 facet arthropathy with gaping, fluid-filled facet joints. Mild bulging of the disc. Mild narrowing of the subarticular lateral recesses and neural foramina. This appearance would likely worsen with standing or flexion based on the appearance of the facet arthropathy. Electronically Signed   By: Nelson Chimes M.D.   On: 09/22/2016 17:05        Scheduled Meds: .  stroke: mapping our early stages of recovery book   Does not apply Once  . allopurinol  300 mg Oral Daily  . amLODipine  5 mg Oral Daily  . atorvastatin  80 mg Oral QHS  . cefTRIAXone (ROCEPHIN)  IV  1 g Intravenous Q24H  . clopidogrel  75 mg Oral Daily  . maprotiline  50 mg Oral QHS  . OXcarbazepine  150 mg Oral Daily  . sodium bicarbonate  650 mg Oral BID  . topiramate  25 mg Oral QHS   Continuous Infusions:   LOS: 3 days    Time spent: 35 minutes    Ramya Vanbergen, MD [facility] Pager [phone]  If 7PM-7AM, please contact night-coverage [email or web address] Password [email or web address] 09/24/2016, 10:55 AM

## 2016-09-24 NOTE — Progress Notes (Signed)
Inpatient Diabetes Program Recommendations  AACE/ADA: New Consensus Statement on Inpatient Glycemic Control (2015)  Target Ranges:  Prepandial:   less than 140 mg/dL      Peak postprandial:   less than 180 mg/dL (1-2 hours)      Critically ill patients:  140 - 180 mg/dL   Lab Results  Component Value Date   GLUCAP 181 (H) 09/20/2016   HGBA1C 5.6 09/22/2016    Review of Glycemic Control Inpatient Diabetes Program Recommendations: Noted A1c decreased to 5.6. Recommend followup post D/C Will follow.  Thank you, Nani Gasser. Doll Frazee, RN, MSN, CDE Inpatient Glycemic Control Team Team Pager [phone] (8am-5pm) 09/24/2016 10:22 AM

## 2016-09-24 NOTE — Progress Notes (Addendum)
STROKE TEAM PROGRESS NOTE   SUBJECTIVE (INTERVAL HISTORY) No family is at the bedside, but I talked with her son over the phone. She still has right sided weakness, pending PT/OT. Son said she had 30 day cardiac event monitoring last summer in [place]. Will request record.   OBJECTIVE Temp:  [97.5 F (36.4 C)-98.1 F (36.7 C)] 98 F (36.7 C) (01/01 0901) Pulse Rate:  [79-94] 83 (01/01 0901) Cardiac Rhythm: Normal sinus rhythm (01/01 0700) Resp:  [16-20] 20 (01/01 0901) BP: (118-140)/(53-69) 138/59 (01/01 0901) SpO2:  [95 %-98 %] 96 % (01/01 0901)  CBC:   Recent Labs Lab 09/20/16 1651 09/22/16 0433 09/23/16 0444  WBC 8.2 7.1 6.2  NEUTROABS 5.6  --   --   HGB 12.8 11.2* 11.8*  HCT 40.3 36.1 37.2  MCV 89.6 88.7 88.4  PLT 292 273 Q000111Q    Basic Metabolic Panel:   Recent Labs Lab 09/20/16 2212  09/23/16 0444 09/24/16 0452  NA  --   < > 140 140  K  --   < > 3.4* 3.8  CL  --   < > 111 110  CO2  --   < > 24 19*  GLUCOSE  --   < > 105* 113*  BUN  --   < > 14 18  CREATININE  --   < > 0.97 1.09*  CALCIUM  --   < > 8.6* 8.8*  MG 1.8  --   --   --   PHOS 2.9  --   --   --   < > = values in this interval not displayed.  Lipid Panel:     Component Value Date/Time   CHOL 156 09/22/2016 0433   TRIG 111 09/22/2016 0433   HDL 47 09/22/2016 0433   CHOLHDL 3.3 09/22/2016 0433   VLDL 22 09/22/2016 0433   LDLCALC 87 09/22/2016 0433   HgbA1c:  Lab Results  Component Value Date   HGBA1C 5.6 09/22/2016   Urine Drug Screen: No results found for: LABOPIA, COCAINSCRNUR, LABBENZ, AMPHETMU, THCU, LABBARB    IMAGING I have personally reviewed the radiological images below and agree with the radiology interpretations.  Ct Head Wo Contrast 09/11/2016 IMPRESSION: No acute intracranial pathology.   Mr Jeri Cos Wo Contrast 09/21/2016 IMPRESSION: 1. Acute infarct in the central left corona radiata, nonhemorrhagic. 2. Focal high-grade left M1 segment stenosis. Elsewhere negative  intracranial MRA. 3. 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   Mr Cervical Spine Wo Contrast 09/22/2016 IMPRESSION: No acute or likely significant finding in the cervical region. No change since the study of November. Degenerative facet arthropathy left worse than right. Mild disc degeneration. No compressive stenosis of the canal or foramina. No primary cord lesion.   Mr Thoracic Spine Wo Contrast 09/23/2016 IMPRESSION: Thoracic scoliosis. Shallow disc protrusion at T2-3 without significant neural compression. No other disc protrusions, spinal or foraminal stenosis in the thoracic spine. Normal MR appearance of the thoracic spinal cord.   Mr Lumbar Spine Wo Contrast 09/22/2016 IMPRESSION: No likely significant finding at L3-4 or above. Old compression fracture at L5 which is healed. Posterior bowing of the posterosuperior margin of the vertebral body. Mild bulging of the L4-5 disc. In combination with the L5 bowing hand with bilateral facet and ligamentous hypertrophy, there is moderate stenosis at this level that could possibly cause neural compression. L5-S1 facet arthropathy with gaping, fluid-filled facet joints. Mild bulging of the disc. Mild narrowing of the subarticular lateral  recesses and neural foramina. This appearance would likely worsen with standing or flexion based on the appearance of the facet arthropathy. 5   TTE - Left ventricle: The cavity size was normal. Systolic function was   normal. The estimated ejection fraction was in the range of 60%   to 65%. Wall motion was normal; there were no regional wall   motion abnormalities. Doppler parameters are consistent with   abnormal left ventricular relaxation (grade 1 diastolic   dysfunction) Impressions: - Compared to the prior study, there has been no significant   interval change.  Carotid Doppler - 08/08/2016 - normal carotid and vertebral arteries.  EEG - The EEG is normal in  awake and drowsy state.  30 day cardia event monitoring - summer 2017 in [place] - record on request   PHYSICAL EXAM  Temp:  [97.5 F (36.4 C)-98.1 F (36.7 C)] 98 F (36.7 C) (01/01 0901) Pulse Rate:  [79-94] 83 (01/01 0901) Resp:  [16-20] 20 (01/01 0901) BP: (118-140)/(53-69) 138/59 (01/01 0901) SpO2:  [95 %-97 %] 96 % (01/01 0901)  General - Well nourished, well developed, in no apparent distress.  Ophthalmologic - Fundi not visualized due to noncooperation.  Cardiovascular - Regular rate and rhythm.  Mental Status -  Level of arousal and orientation to time, place, and person were intact. Language including expression, naming, repetition, comprehension was assessed and found intact. Fund of Knowledge was assessed and was impaired.  Cranial Nerves II - XII - II - Visual field intact OU. III, IV, VI - Extraocular movements intact. V - Facial sensation intact bilaterally. VII - mild right facial droop. VIII - Hearing & vestibular intact bilaterally. X - Palate elevates symmetrically. XI - Chin turning & shoulder shrug intact bilaterally. XII - Tongue protrusion to the right  Motor Strength - The patient's strength was 4/5 LUE and LLE, but 3/5 RUE and 3-/5 RLE.  Bulk was normal and fasciculations were absent.   Motor Tone - Muscle tone was assessed at the neck and appendages and was normal.  Reflexes - The patient's reflexes were 1+ in all extremities and she had no pathological reflexes.  Sensory - Light touch, temperature/pinprick were assessed and were symmetrical.    Coordination - The patient had normal movements in the hands with no ataxia or dysmetria.  Tremor was absent.  Gait and Station - deferred.   ASSESSMENT/PLAN Ms. Rajanee Wences is a 76 y.o. female with history of hypertension, breast cancer, hyperlipidemia and depression  presenting with unresponsive episodes, dysphagia, confusion, and progressive weakness. She did not receive IV t-PA due to unknown  time of onset.  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  Carotid Doppler - 08/08/2016 - normal carotid and vertebral arteries.  EEG normal  2D Echo - EF 60%  to 65%  30 day cardiac event monitoring done in 02/2016 in [place], [place]. Record on request  LDL - 87  HgbA1c pending  VTE prophylaxis - SCDs Diet heart healthy/carb modified Room service appropriate? Yes; Fluid consistency: Thin  aspirin 81 mg daily prior to admission, now on clopidogrel 75 mg daily and ASA 325mg  daily. Continue DAPT for 3 months and then plavix alone due to intracranial stenosis.   Patient counseled to be compliant with her antithrombotic medications  Ongoing aggressive stroke risk factor management  Therapy recommendations:CIR  Disposition: Pending  Episodes of passing out  Continue to occur after stopping beta blocker  Concerning for complex partial seizure  Follow up [facility] Leta [facility]  EEG negative  On trileptal 150mg  daily - increase to 300mg  bid   30 day cardiac event monitoring done last summer in [place] - record on request  Walking difficulty  Progressive walking difficulty  MRI C/T/L spine unrevealing  Denies significant back pain  Complicated by left CR infarct with right sided hemiparesis  CIR recommended  Follow up with Dr. Leta Baptist as outpt - may consider EMG/NCS  Migraine   On topamax 25mg  daily  Pt HA did not improve  Increase topamax to 25mg  bid  Follow up with Dr. Leta Baptist as outpt  Hypertension  Stable  Permissive hypertension (OK if < 220/120) but gradually normalize in 5-7 days  Long-term BP goal normotensive  Hyperlipidemia  Home meds: Lipitor 40 mg daily resumed in hospital  LDL 87, goal < 70  Continue statin at discharge  Other Stroke Risk  Factors  Advanced age  Obesity, Body mass index is 46.31 kg/m., recommend weight loss, diet and exercise as appropriate    Other Active Problems  UTI - UA WBC 6-30 - on ceftin  Hospital day # 3  Rosalin Hawking, MD PhD Stroke Neurology 09/24/2016 10:30 PM    To contact Stroke Continuity provider, please refer to [email or web address] After hours, contact General Neurology

## 2016-09-25 ENCOUNTER — Encounter (HOSPITAL_COMMUNITY): Payer: Self-pay | Admitting: *Deleted

## 2016-09-25 ENCOUNTER — Inpatient Hospital Stay (HOSPITAL_COMMUNITY)
Admission: RE | Admit: 2016-09-25 | Discharge: 2016-10-12 | DRG: 092 | Disposition: A | Discharge status: Home Health | Payer: Medicare Other | Source: Intra-hospital | Attending: Physical Medicine & Rehabilitation | Admitting: Physical Medicine & Rehabilitation

## 2016-09-25 DIAGNOSIS — E876 Hypokalemia: Secondary | ICD-10-CM | POA: Diagnosis not present

## 2016-09-25 DIAGNOSIS — M25562 Pain in left knee: Secondary | ICD-10-CM | POA: Diagnosis present

## 2016-09-25 DIAGNOSIS — R7989 Other specified abnormal findings of blood chemistry: Secondary | ICD-10-CM | POA: Diagnosis not present

## 2016-09-25 DIAGNOSIS — G43909 Migraine, unspecified, not intractable, without status migrainosus: Secondary | ICD-10-CM | POA: Diagnosis present

## 2016-09-25 DIAGNOSIS — R269 Unspecified abnormalities of gait and mobility: Secondary | ICD-10-CM | POA: Diagnosis not present

## 2016-09-25 DIAGNOSIS — I69322 Dysarthria following cerebral infarction: Secondary | ICD-10-CM

## 2016-09-25 DIAGNOSIS — Z6841 Body Mass Index (BMI) 40.0 and over, adult: Secondary | ICD-10-CM | POA: Diagnosis not present

## 2016-09-25 DIAGNOSIS — I69391 Dysphagia following cerebral infarction: Secondary | ICD-10-CM | POA: Diagnosis not present

## 2016-09-25 DIAGNOSIS — I679 Cerebrovascular disease, unspecified: Secondary | ICD-10-CM | POA: Insufficient documentation

## 2016-09-25 DIAGNOSIS — I69398 Other sequelae of cerebral infarction: Secondary | ICD-10-CM | POA: Diagnosis not present

## 2016-09-25 DIAGNOSIS — F329 Major depressive disorder, single episode, unspecified: Secondary | ICD-10-CM | POA: Diagnosis present

## 2016-09-25 DIAGNOSIS — I63312 Cerebral infarction due to thrombosis of left middle cerebral artery: Secondary | ICD-10-CM | POA: Diagnosis not present

## 2016-09-25 DIAGNOSIS — R7303 Prediabetes: Secondary | ICD-10-CM

## 2016-09-25 DIAGNOSIS — R1310 Dysphagia, unspecified: Secondary | ICD-10-CM | POA: Diagnosis present

## 2016-09-25 DIAGNOSIS — B961 Klebsiella pneumoniae [K. pneumoniae] as the cause of diseases classified elsewhere: Secondary | ICD-10-CM | POA: Diagnosis present

## 2016-09-25 DIAGNOSIS — E860 Dehydration: Secondary | ICD-10-CM

## 2016-09-25 DIAGNOSIS — R4182 Altered mental status, unspecified: Secondary | ICD-10-CM | POA: Diagnosis present

## 2016-09-25 DIAGNOSIS — R2689 Other abnormalities of gait and mobility: Principal | ICD-10-CM | POA: Diagnosis present

## 2016-09-25 DIAGNOSIS — E785 Hyperlipidemia, unspecified: Secondary | ICD-10-CM | POA: Diagnosis present

## 2016-09-25 DIAGNOSIS — I10 Essential (primary) hypertension: Secondary | ICD-10-CM | POA: Diagnosis present

## 2016-09-25 DIAGNOSIS — N390 Urinary tract infection, site not specified: Secondary | ICD-10-CM | POA: Diagnosis not present

## 2016-09-25 DIAGNOSIS — R4701 Aphasia: Secondary | ICD-10-CM | POA: Diagnosis not present

## 2016-09-25 DIAGNOSIS — R0789 Other chest pain: Secondary | ICD-10-CM | POA: Diagnosis not present

## 2016-09-25 DIAGNOSIS — R296 Repeated falls: Secondary | ICD-10-CM | POA: Diagnosis present

## 2016-09-25 DIAGNOSIS — Z803 Family history of malignant neoplasm of breast: Secondary | ICD-10-CM

## 2016-09-25 DIAGNOSIS — Z853 Personal history of malignant neoplasm of breast: Secondary | ICD-10-CM | POA: Diagnosis not present

## 2016-09-25 DIAGNOSIS — R32 Unspecified urinary incontinence: Secondary | ICD-10-CM | POA: Diagnosis present

## 2016-09-25 DIAGNOSIS — R404 Transient alteration of awareness: Secondary | ICD-10-CM

## 2016-09-25 DIAGNOSIS — I69390 Apraxia following cerebral infarction: Secondary | ICD-10-CM | POA: Diagnosis not present

## 2016-09-25 DIAGNOSIS — I639 Cerebral infarction, unspecified: Secondary | ICD-10-CM | POA: Diagnosis not present

## 2016-09-25 DIAGNOSIS — Z888 Allergy status to other drugs, medicaments and biological substances status: Secondary | ICD-10-CM | POA: Diagnosis not present

## 2016-09-25 DIAGNOSIS — I69351 Hemiplegia and hemiparesis following cerebral infarction affecting right dominant side: Secondary | ICD-10-CM | POA: Diagnosis not present

## 2016-09-25 DIAGNOSIS — I69320 Aphasia following cerebral infarction: Secondary | ICD-10-CM | POA: Diagnosis not present

## 2016-09-25 DIAGNOSIS — Z79899 Other long term (current) drug therapy: Secondary | ICD-10-CM

## 2016-09-25 DIAGNOSIS — G8191 Hemiplegia, unspecified affecting right dominant side: Secondary | ICD-10-CM | POA: Diagnosis not present

## 2016-09-25 DIAGNOSIS — M109 Gout, unspecified: Secondary | ICD-10-CM | POA: Diagnosis present

## 2016-09-25 DIAGNOSIS — Z7982 Long term (current) use of aspirin: Secondary | ICD-10-CM

## 2016-09-25 DIAGNOSIS — G9340 Encephalopathy, unspecified: Secondary | ICD-10-CM

## 2016-09-25 DIAGNOSIS — R569 Unspecified convulsions: Secondary | ICD-10-CM

## 2016-09-25 DIAGNOSIS — R5381 Other malaise: Secondary | ICD-10-CM

## 2016-09-25 DIAGNOSIS — F411 Generalized anxiety disorder: Secondary | ICD-10-CM

## 2016-09-25 LAB — TROPONIN I
Troponin I: <0.03 ng/mL (ref <0.03)
Troponin I: <0.03 ng/mL (ref <0.03)

## 2016-09-25 LAB — ANTINUCLEAR ANTIBODIES, IFA: ANA Ab, IFA: NEGATIVE

## 2016-09-25 MED ORDER — MAPROTILINE HCL 50 MG PO TABS
50.0000 mg | ORAL_TABLET | Freq: Every day | ORAL | Status: DC
  Administered 2016-09-25 – 2016-10-11 (×17): 50 mg via ORAL
  Filled 2016-09-25 (×17): qty 1

## 2016-09-25 MED ORDER — CLOPIDOGREL BISULFATE 75 MG PO TABS
75.0000 mg | ORAL_TABLET | Freq: Every day | ORAL | Status: DC
  Administered 2016-09-26 – 2016-10-12 (×17): 75 mg via ORAL
  Filled 2016-09-25 (×17): qty 1

## 2016-09-25 MED ORDER — AMLODIPINE BESYLATE 5 MG PO TABS
5.0000 mg | ORAL_TABLET | Freq: Every day | ORAL | Status: DC
  Administered 2016-09-26 – 2016-10-12 (×17): 5 mg via ORAL
  Filled 2016-09-25 (×17): qty 1

## 2016-09-25 MED ORDER — CEFUROXIME AXETIL 500 MG PO TABS
500.0000 mg | ORAL_TABLET | Freq: Two times a day (BID) | ORAL | Status: DC
  Administered 2016-09-26 – 2016-10-05 (×19): 500 mg via ORAL
  Filled 2016-09-25 (×19): qty 1

## 2016-09-25 MED ORDER — OXCARBAZEPINE 300 MG PO TABS
300.0000 mg | ORAL_TABLET | Freq: Two times a day (BID) | ORAL | Status: DC
  Administered 2016-09-25 – 2016-10-12 (×34): 300 mg via ORAL
  Filled 2016-09-25 (×34): qty 1

## 2016-09-25 MED ORDER — TOPIRAMATE 25 MG PO TABS: 25.0000 mg | ORAL_TABLET | Freq: Two times a day (BID) | ORAL | 0 refills | Status: DC

## 2016-09-25 MED ORDER — ACETAMINOPHEN 650 MG RE SUPP: 650.0000 mg | RECTAL | Status: DC | PRN

## 2016-09-25 MED ORDER — ASPIRIN EC 325 MG PO TBEC
325.0000 mg | DELAYED_RELEASE_TABLET | Freq: Every day | ORAL | Status: DC
  Administered 2016-09-26 – 2016-10-12 (×17): 325 mg via ORAL
  Filled 2016-09-25 (×17): qty 1

## 2016-09-25 MED ORDER — ASPIRIN 325 MG PO TBEC: 325.0000 mg | DELAYED_RELEASE_TABLET | Freq: Every day | ORAL | 0 refills | Status: AC

## 2016-09-25 MED ORDER — CLONAZEPAM 0.5 MG PO TABS
1.0000 mg | ORAL_TABLET | Freq: Every day | ORAL | Status: DC
Start: 2016-09-25 — End: 2016-10-12
  Administered 2016-09-25 – 2016-10-11 (×17): 1 mg via ORAL
  Filled 2016-09-25 (×17): qty 2

## 2016-09-25 MED ORDER — SORBITOL 70 % SOLN
30.0000 mL | Freq: Every day | Status: DC | PRN
  Filled 2016-09-25 (×2): qty 30

## 2016-09-25 MED ORDER — CLOPIDOGREL BISULFATE 75 MG PO TABS: 75.0000 mg | ORAL_TABLET | Freq: Every day | ORAL | 0 refills | Status: DC

## 2016-09-25 MED ORDER — ACETAMINOPHEN 160 MG/5ML PO SOLN: 650.0000 mg | ORAL | Status: DC | PRN

## 2016-09-25 MED ORDER — AMOXICILLIN-POT CLAVULANATE 875-125 MG PO TABS: 1.0000 | ORAL_TABLET | Freq: Two times a day (BID) | ORAL | 0 refills | Status: DC

## 2016-09-25 MED ORDER — OXCARBAZEPINE 300 MG PO TABS: 300.0000 mg | ORAL_TABLET | Freq: Two times a day (BID) | ORAL | 0 refills | Status: DC

## 2016-09-25 MED ORDER — MONTELUKAST SODIUM 10 MG PO TABS: 10.0000 mg | ORAL_TABLET | Freq: Every day | ORAL | Status: DC | PRN

## 2016-09-25 MED ORDER — ATORVASTATIN CALCIUM 40 MG PO TABS
40.0000 mg | ORAL_TABLET | Freq: Every day | ORAL | Status: DC
  Administered 2016-09-25 – 2016-10-11 (×17): 40 mg via ORAL
  Filled 2016-09-25 (×17): qty 1

## 2016-09-25 MED ORDER — ALLOPURINOL 100 MG PO TABS
300.0000 mg | ORAL_TABLET | Freq: Every day | ORAL | Status: DC
  Administered 2016-09-26 – 2016-10-12 (×17): 300 mg via ORAL
  Filled 2016-09-25 (×17): qty 3

## 2016-09-25 MED ORDER — TOPIRAMATE 25 MG PO TABS
25.0000 mg | ORAL_TABLET | Freq: Two times a day (BID) | ORAL | Status: DC
  Administered 2016-09-25 – 2016-10-03 (×16): 25 mg via ORAL
  Filled 2016-09-25 (×16): qty 1

## 2016-09-25 MED ORDER — ACETAMINOPHEN 325 MG PO TABS
650.0000 mg | ORAL_TABLET | ORAL | Status: DC | PRN
  Administered 2016-10-02 – 2016-10-10 (×3): 650 mg via ORAL
  Filled 2016-09-25 (×3): qty 2

## 2016-09-25 MED ORDER — SODIUM BICARBONATE 650 MG PO TABS: 650.0000 mg | ORAL_TABLET | Freq: Every day | ORAL | 0 refills | Status: DC

## 2016-09-25 NOTE — Care Management Note (Signed)
Case Management Note  Patient Details  Name: Savannah Travis MRN: [id number] Date of Birth: May 21, 1941  Subjective/Objective:  Pt admitted with CVA. She is from home with her spouse.                  Action/Plan: Pt discharging to CIR today. No further needs per CM.   Expected Discharge Date:                  Expected Discharge Plan:  [place]  In-House Referral:     Discharge planning Services     Post Acute Care Choice:    Choice offered to:     DME Arranged:    DME Agency:     HH Arranged:    [place] Agency:     Status of Service:  Completed, signed off  If discussed at [employer] of Stay Meetings, dates discussed:    Additional Comments:  Pollie Friar, RN 09/25/2016, 1:40 PM

## 2016-09-25 NOTE — Progress Notes (Signed)
Physical Therapy Treatment Patient Details Name: Savannah Travis MRN: [id number] DOB: Mar 09, 1941 Today's Date: 09/25/2016    History of Present Illness 76 y.o. female with a history of hypertension, hyperlipidemia and depression who was brought to the emergency room by family following an episode of unresponsiveness. 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     PT Comments    Pt progressing towards physical therapy goals. Was able to perform transfers and ambulation with up to +2 mod assist for balance support and safety. Pt continues to fatigue quickly but with good overall rehab effort. Pt is hopeful for d/c to CIR soon to continue therapy.   Follow Up Recommendations  CIR;Supervision/Assistance - 24 hour     Equipment Recommendations  None recommended by PT    Recommendations for Other Services Rehab consult     Precautions / Restrictions Precautions Precautions: Fall Restrictions Weight Bearing Restrictions: No    Mobility  Bed Mobility Overal bed mobility: Needs Assistance Bed Mobility: Supine to Sit     Supine to sit: Min assist     General bed mobility comments: Assist to scoot fully to EOB. Bed pad used to complete scooting. Pt with difficulty getting feet on floor due to her height.   Transfers Overall transfer level: Needs assistance Equipment used: Rolling walker (2 wheeled) Transfers: Sit to/from Stand Sit to Stand: Mod assist         General transfer comment: Assist to power-up to full standing position. First attempt with 2 person HHA and second attempt with RW. Pt did better with RW and required less support for balance.   Ambulation/Gait Ambulation/Gait assistance: Min assist;Mod assist;+2 physical assistance Ambulation Distance (Feet): 10 Feet Assistive device: Rolling walker (2 wheeled);2 person hand held assist Gait Pattern/deviations: Step-to pattern;Decreased stride length;Trunk flexed;Narrow base of  support Gait velocity: decreased Gait velocity interpretation: <1.8 ft/sec, indicative of risk for recurrent falls General Gait Details: With 2 person HHA increased assist was required for weight shift and advancement of RLE. With RW, pt was able to take steps without therapist assist for RLE advancement, however fatigued very quickly.    Stairs            Wheelchair Mobility    Modified Rankin (Stroke Patients Only) Modified Rankin (Stroke Patients Only) Pre-Morbid Rankin Score: No symptoms Modified Rankin: Severe disability     Balance Overall balance assessment: Needs assistance;History of Falls Sitting-balance support: Bilateral upper extremity supported;Feet supported Sitting balance-Leahy Scale: Poor       Standing balance-Leahy Scale: Poor Standing balance comment: reliance on UE support for stability and upright positioning                    Cognition Arousal/Alertness: Awake/alert Behavior During Therapy: WFL for tasks assessed/performed Overall Cognitive Status: History of cognitive impairments - at baseline                      Exercises General Exercises - Lower Extremity Ankle Circles/Pumps: 10 reps Heel Slides: 5 reps Hip ABduction/ADduction: 5 reps    General Comments        Pertinent Vitals/Pain Pain Assessment: No/denies pain    Home Living                      Prior Function            PT Goals (current goals can now be found in the care plan section) Acute Rehab PT  Goals Patient Stated Goal: to not fall PT Goal Formulation: With patient Time For Goal Achievement: 10/05/16 Potential to Achieve Goals: Good Progress towards PT goals: Progressing toward goals    Frequency    Min 4X/week      PT Plan Current plan remains appropriate    Co-evaluation             End of Session Equipment Utilized During Treatment: Gait belt Activity Tolerance: Patient limited by fatigue Patient left: in  chair;with call bell/phone within reach;with chair alarm set     Time: [id number] PT Time Calculation (min) (ACUTE ONLY): 25 min  Charges:  $Gait Training: 8-22 mins $Therapeutic Activity: 8-22 mins                    G Codes:      Thelma Comp Oct 25, 2016, 10:21 AM   Rolinda Roan, PT, DPT Acute Rehabilitation Services Pager: [phone]

## 2016-09-25 NOTE — H&P (Signed)
Physical Medicine and Rehabilitation Admission H&P    Chief Complaint  Patient presents with  . Altered Mental Status  : HPI: Savannah Travis is a 76 y.o. right handed female with history of Depression, breast cancer, migraine headaches and her chart review patient lives with spouse. Independent with a walker for short distances. One level home with ramped entrance. She has had some recent falls.. Presented 09/20/2016 with episode of unresponsiveness while riding in a car with her family and intermittent headaches. She began to respond after 10-15 minutes. Family had reported 3 such events since 09/17/2016 as well as complaints of progressive weakness and decrease in mobility. She had had a 30 day cardiac event monitor done last summer in [place] records were not yet available. 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 MRA with focal high-grade left M1 segment stenosis elsewhere negative intracranial MRA. Patient did not receive TPA. Echocardiogram with ejection fraction of 45% grade 1 diastolic dysfunction. EEG negative. A carotid Doppler 08/08/2016 was negative. Neurology consulted and maintained on aspirin and Plavix for CVA prophylaxis 3 months then Plavix alone. Findings of questionable prediabetes mellitus with hemoglobin A1c 5.6. Klebsiella UTI intravenous Rocephin transitioned to oral Ceftin. MRI cervical lumbar thoracic spine completed due to persistent weakness of lower extremities and felt to be unremarkable as evaluated by Dr. Kathyrn Sheriff and plan follow-up outpatient with Dr. Leta Baptist to consider possible EMG. Physical and occupational therapy evaluations completed 09/21/2016 with recommendations of physical medicine rehabilitation consult. Patient was admitted for a comprehensive rehabilitation program  Review of Systems  Constitutional: Negative for fever.  HENT: Positive for hearing loss. Negative for tinnitus.   Eyes: Negative for blurred vision and double  vision.  Respiratory: Negative for cough.        Occasional shortness of breath with exertion  Cardiovascular: Positive for leg swelling. Negative for chest pain and palpitations.  Gastrointestinal: Positive for constipation. Negative for heartburn and nausea.  Genitourinary: Negative for dysuria, flank pain and hematuria.       Stress incontinence  Musculoskeletal: Positive for falls, joint pain and myalgias.  Skin: Negative for rash.  Neurological: Positive for dizziness and weakness. Negative for loss of consciousness.  Psychiatric/Behavioral: Positive for depression.       Anxiety  All other systems reviewed and are negative.  Past Medical History:  Diagnosis Date  . Cancer (HCC)    breast  . Headache    migraines  . Hypertension   . S/P wrist surgery 2015, 2017   for fx   Past Surgical History:  Procedure Laterality Date  . BREAST SURGERY Right 2012   lumps removed  . OVARIAN CYST SURGERY  1962   Family History  Problem Relation Age of Onset  . Stroke Father   . CAD Father   . Cancer Sister   . Breast cancer Sister   . Colon cancer Sister   . Stroke Other   . Rheum arthritis Neg Hx    Social History:  reports that she has never smoked. She has never used smokeless tobacco. She reports that she does not drink alcohol or use drugs. Allergies:  Allergies  Allergen Reactions  . Demerol [Meperidine] Nausea And Vomiting  . Integra [Fe Fum-Fepoly-Vit C-Vit B3] Diarrhea  . Magnesium Oxide Diarrhea  . Metformin And Related Diarrhea  . Sulfonylureas Swelling and Rash   Medications Prior to Admission  Medication Sig Dispense Refill  . allopurinol (ZYLOPRIM) 300 MG tablet Take 300 mg by mouth every morning.     Marland Kitchen  amLODipine (NORVASC) 5 MG tablet Take 5 mg by mouth every morning.     Marland Kitchen aspirin 81 MG chewable tablet Chew 81 mg by mouth at bedtime.     Marland Kitchen atorvastatin (LIPITOR) 40 MG tablet Take 40 mg by mouth at bedtime.     . butalbital-acetaminophen-caffeine (FIORICET  WITH CODEINE) 50-325-40-30 MG capsule Take 1 capsule by mouth every 4 (four) hours as needed for headache.    . calcium-vitamin D (CALCIUM 500/D) 500-200 MG-UNIT tablet Take 1 tablet by mouth at bedtime.     . Cholecalciferol (VITAMIN D3) 5000 units TABS Take 1 tablet by mouth at bedtime.     . clidinium-chlordiazePOXIDE (LIBRAX) 5-2.5 MG capsule Take 2 capsules by mouth daily as needed (for spasms).     . clonazePAM (KLONOPIN) 1 MG tablet Take 1 mg by mouth at bedtime as needed for anxiety.     . Coenzyme Q10 (COQ10) 200 MG CAPS Take 200 mg by mouth 2 (two) times daily.    Marland Kitchen denosumab (PROLIA) 60 MG/ML SOLN injection Inject 60 mg into the skin every 6 (six) months. Administer in upper arm, thigh, or abdomen    . escitalopram (LEXAPRO) 10 MG tablet Take 10 mg by mouth at bedtime.     . Glucos-Chond-Hyal Ac-Ca Fructo (MOVE FREE JOINT HEALTH ADVANCE PO) Take 1 tablet by mouth every morning.     . maprotiline (LUDIOMIL) 50 MG tablet Take 50 mg by mouth at bedtime.    . meclizine (ANTIVERT) 12.5 MG tablet Take 12.5 mg by mouth daily as needed for dizziness.     . montelukast (SINGULAIR) 10 MG tablet Take 10 mg by mouth daily as needed (for SOB).     . MYRBETRIQ 50 MG TB24 tablet Take 50 mg by mouth every morning.     Marland Kitchen omeprazole (PRILOSEC) 40 MG capsule Take 40 mg by mouth at bedtime.     . OXcarbazepine (TRILEPTAL) 150 MG tablet Take 150 mg by mouth every morning.    . Pancrelipase, Lip-Prot-Amyl, 5000 units CPEP Take 5,000-10,000 Units by mouth daily as needed (diarrhea).     . topiramate (TOPAMAX) 25 MG tablet Take 25 mg by mouth at bedtime.       Home: Home Living Family/patient expects to be discharged to:: Private residence Living Arrangements: Spouse/significant other Available Help at Discharge: Family Type of Home: House Home Access: [place]: One level Bathroom Shower/Tub: Multimedia programmer: [place]: Environmental consultant - 2 wheels, Shower seat,  Bedside commode, Radio producer - single point, Wheelchair - manual  Lives With: Spouse   Functional History: Prior Function Level of Independence: Independent  Functional Status:  Mobility: Bed Mobility Overal bed mobility: Needs Assistance Bed Mobility: Rolling, Sidelying to Sit Rolling: Min guard Sidelying to sit: Mod assist General bed mobility comments: min guard to roll to right sidelying using LUE to reach towards rail and LLE to position, increased time required to perform rolling, increased physical assist required to power up from sidelying and rotate trunk to EOB. Transfers Overall transfer level: Needs assistance Equipment used:  (2 person face to face) Transfers: Sit to/from Stand Sit to Stand: +2 physical assistance, Min assist General transfer comment: +2 min assist to power to upright position at EOB with UE supported on therapists' arms Ambulation/Gait Ambulation/Gait assistance: Mod assist, +2 physical assistance Ambulation Distance (Feet): 8 Feet Assistive device: 2 person hand held assist (with gait belt and wrap around support) Gait Pattern/deviations: Step-to pattern, Decreased stride length, Shuffle, Decreased  dorsiflexion - right, Decreased weight shift to left General Gait Details: patient with heavy list to the right, required manual assist to weight shift to the left and cues for step placement of RLE. Increased physical assist to maintain upright positioning. During short ambulation patient with muscular fatigue and increasing weakness Gait velocity: decreased Gait velocity interpretation: <1.8 ft/sec, indicative of risk for recurrent falls    ADL: ADL Overall ADL's : Needs assistance/impaired Grooming: Wash/dry hands, Wash/dry face, Sitting, Minimal assistance Upper Body Bathing: Moderate assistance Lower Body Bathing: Total assistance Toilet Transfer: +2 for physical assistance, Minimal assistance, Stand-pivot Toileting - Clothing Manipulation Details  (indicate cue type and reason): in continence noted on arrival due to x2 techs helping with hyigene bed level  General ADL Comments: pt requires weight shift R to L to help facilitate stepping. pt dragging R LE. pt with decr gait length and velocity noted  Cognition: Cognition Overall Cognitive Status: History of cognitive impairments - at baseline (Daughter states that pt is at new baseline except for speech) Arousal/Alertness: Awake/alert Orientation Level: Oriented X4 Attention: Focused Focused Attention: Appears intact Memory: Appears intact Awareness: Appears intact Cognition Arousal/Alertness: Awake/alert Behavior During Therapy: WFL for tasks assessed/performed Overall Cognitive Status: History of cognitive impairments - at baseline (Daughter states that pt is at new baseline except for speech) General Comments: Pt able to report "i had a stroke" pt reports diffculty expressing words. pt reports outpatient therapy of L wrist due to fall in June with break.   Physical Exam: Blood pressure 119/63, pulse 74, temperature 98.1 F (36.7 C), temperature source Oral, resp. rate 18, height 4' 8"  (1.422 m), weight 93.7 kg (206 lb 9.1 oz), SpO2 95 %. Physical Exam  Vitals reviewed. Constitutional: She appears well-developed. No distress.  HENT:  Head: Normocephalic and atraumatic.  Right Ear: External ear normal.  Left Ear: External ear normal.  Eyes: EOM are normal. Left eye exhibits no discharge.  Neck: Normal range of motion. Neck supple. No JVD present. No tracheal deviation present. No thyromegaly present.  Cardiovascular: Normal rate, regular rhythm, normal heart sounds and intact distal pulses.   Respiratory: Effort normal and breath sounds normal. No respiratory distress. She has no wheezes. She exhibits no tenderness.  GI: Soft. Bowel sounds are normal. She exhibits no distension. There is no tenderness.  Musculoskeletal: She exhibits no edema or deformity.  Skin: Skin is warm  and dry.  Psychiatric: She has a normal mood and affect. Her behavior is normal.  Neurological:  She is alert, oriented. Follows simple commands. Has reasonable insight and awareness. RUE: 3-/5 deltoid, 3/5 biceps, triceps, wrist, hand. LUE 4- to 4/5. RLE:  2/5 HF, 3-KE, 3-/5 ADF/PF. LLE: 3 to 3+/5 KE, HF, 4/5 ADF/PF. Sensation nearly normal to LT,pain bilateral upper and lower exts.  DTR's 1+. Mild right cetnral 7, no deviation of tongue, speech generally clear.  Skin: Skin is warm and dry Psych: pleasant and cooperative  Results for orders placed or performed during the hospital encounter of 09/20/16 (from the past 48 hour(s))  Troponin I     Status: None   Collection Time: 09/23/16 10:19 AM  Result Value Ref Range   Troponin I <0.03 <0.03 ng/mL  Troponin I     Status: None   Collection Time: 09/23/16  3:23 PM  Result Value Ref Range   Troponin I <0.03 <0.03 ng/mL  Troponin I     Status: None   Collection Time: 09/23/16 10:38 PM  Result Value Ref Range  Troponin I <0.03 <0.03 ng/mL  Troponin I     Status: None   Collection Time: 09/24/16  4:52 AM  Result Value Ref Range   Troponin I <0.03 <0.03 ng/mL  Basic metabolic panel     Status: Abnormal   Collection Time: 09/24/16  4:52 AM  Result Value Ref Range   Sodium 140 135 - 145 mmol/L   Potassium 3.8 3.5 - 5.1 mmol/L   Chloride 110 101 - 111 mmol/L   CO2 19 (L) 22 - 32 mmol/L   Glucose, Bld 113 (H) 65 - 99 mg/dL   BUN 18 6 - 20 mg/dL   Creatinine, Ser 1.09 (H) 0.44 - 1.00 mg/dL   Calcium 8.8 (L) 8.9 - 10.3 mg/dL   GFR calc non Af Amer 48 (L) >60 mL/min   GFR calc Af Amer 56 (L) >60 mL/min    Comment: (NOTE) The eGFR has been calculated using the CKD EPI equation. This calculation has not been validated in all clinical situations. eGFR's persistently <60 mL/min signify possible Chronic Kidney Disease.    Anion gap 11 5 - 15  Troponin I     Status: None   Collection Time: 09/24/16 10:18 AM  Result Value Ref Range    Troponin I <0.03 <0.03 ng/mL  Troponin I     Status: None   Collection Time: 09/24/16  4:21 PM  Result Value Ref Range   Troponin I <0.03 <0.03 ng/mL  Troponin I     Status: None   Collection Time: 09/24/16 11:28 PM  Result Value Ref Range   Troponin I <0.03 <0.03 ng/mL   Mr Thoracic Spine Wo Contrast  Result Date: 09/23/2016 CLINICAL DATA:  Weakness and inability to ambulate. EXAM: MRI THORACIC SPINE WITHOUT CONTRAST TECHNIQUE: Multiplanar, multisequence MR imaging of the thoracic spine was performed. No intravenous contrast was administered. COMPARISON:  Cervical and lumbar spine MRI examinations from yesterday. FINDINGS: Alignment:  Scoliosis but normal alignment in the sagittal plane. Vertebrae: Normal marrow signal except for a few small scattered hemangiomas. Mild endplate reactive changes are noted at T12-L1. Cord: Normal signal intensity. No cord lesions or syrinx. The conus medullaris is normal. Paraspinal and other soft tissues: No significant findings. Disc levels: Shallow disc protrusion at T2-3 with mild flattening of the ventral thecal sac. No other significant disc protrusions, spinal or foraminal stenosis involving the thoracic spine. Minimal disc bulges at T11-12 and T12-L1. IMPRESSION: Thoracic scoliosis. Shallow disc protrusion at T2-3 without significant neural compression. No other disc protrusions, spinal or foraminal stenosis in the thoracic spine. Normal MR appearance of the thoracic spinal cord. Electronically Signed   By: Marijo Sanes M.D.   On: 09/23/2016 18:44       Medical Problem List and Plan: 1. Decreased functional mobility with lower extremity weakness/altered mental status/question syncope  secondary to left corona radiata infarct  -admit to inpatient rehab 2.  DVT Prophylaxis/Anticoagulation: SCDs. Monitor for any signs of DVT 3. Pain Management/headaches: Topamax 25 mg twice a day 4. Mood: Ludiomil 50 mg daily at bedtime. Klonopin 1 mg daily at  bedtime---pt would like this scheduled 5. Neuropsych: This patient is capable of making decisions on her own behalf. 6. Skin/Wound Care: Routine skin checks 7. Fluids/Electrolytes/Nutrition: Routine I&O with follow-up chemistries upon admit. Encourage PO 8. 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 EEG negative. Continue Trileptal 300 mg twice a day 9. Hypertension. Norvasc 5 mg daily. Monitor with increased mobility 10. Klebsiella UTI. Ceftin initiated 09/24/2016 transitioning from Rocephin. 11. Hyperlipidemia.  Lipitor 12. History of gout. Allopurinol 300 mg daily. Monitor for any gout flareups. No pain at present  Post Admission Physician Evaluation: 1. Functional deficits secondary  to left corona radiata infarct. 2. Patient is admitted to receive collaborative, interdisciplinary care between the physiatrist, rehab nursing staff, and therapy team. 3. Patient's level of medical complexity and substantial therapy needs in context of that medical necessity cannot be provided at a lesser intensity of care such as a SNF. 4. Patient has experienced substantial functional loss from his/her baseline which was documented above under the "Functional History" and "Functional Status" headings.  Judging by the patient's diagnosis, physical exam, and functional history, the patient has potential for functional progress which will result in measurable gains while on inpatient rehab.  These gains will be of substantial and practical use upon discharge  in facilitating mobility and self-care at the household level. 5. Physiatrist will provide 24 hour management of medical needs as well as oversight of the therapy plan/treatment and provide guidance as appropriate regarding the interaction of the two. 6. The Preadmission Screening has been reviewed and patient status is unchanged unless otherwise stated above. 7. 24 hour rehab nursing will assist with bladder management, bowel management, safety, skin/wound  care, disease management, medication administration, pain management and patient education  and help integrate therapy concepts, techniques,education, etc. 8. PT will assess and treat for/with: Lower extremity strength, range of motion, stamina, balance, functional mobility, safety, adaptive techniques and equipment, NMR, pain control, family education.   Goals are: mod I to supervision. 9. OT will assess and treat for/with: ADL's, functional mobility, safety, upper extremity strength, adaptive techniques and equipment, NMR, community reintegration, family ed.   Goals are: mod I to supervision. Therapy may proceed with showering this patient. 10. SLP will assess and treat for/with: cognition, communication.  Goals are: mod I. 11. Case Management and Social Worker will assess and treat for psychological issues and discharge planning. 12. Team conference will be held weekly to assess progress toward goals and to determine barriers to discharge. 13. Patient will receive at least 3 hours of therapy per day at least 5 days per week. 14. ELOS: 7-10 days       15. Prognosis:  excellent     Meredith Staggers, MD, [facility] 09/25/2016  Cathlyn Parsons., PA-C 09/25/2016

## 2016-09-25 NOTE — Discharge Summary (Signed)
Discharge Summary  Savannah Travis [id number] DOB: [birth year]  PCP: Keane Police, MD  Admit date: 09/20/2016 Discharge date: 09/25/2016  Time spent: >35mins   Patient is discharged to CIR, she is to follow up with neurology and primary care doctor once discharged from [place].  Discharge Diagnoses:  Active Hospital Problems   Diagnosis Date Noted  . CVA (cerebral vascular accident) ([place]) 09/22/2016  . Gait disorder   . UTI due to Klebsiella species 09/23/2016  . Pre-diabetes 09/23/2016  . Spinal stenosis of lumbar region without neurogenic claudication   . Dysarthria 09/21/2016  . Unresponsiveness 09/20/2016  . Hypokalemia 09/20/2016  . Debility 09/20/2016  . Dehydration 09/20/2016  . Prolonged QT interval 09/20/2016  . Hypertension 09/20/2016  . Acute encephalopathy 09/20/2016    Resolved Hospital Problems   Diagnosis Date Noted Date Resolved  No resolved problems to display.    Discharge Condition: stable  Diet recommendation: heart healthy/carb modified  Filed Weights   09/21/16 0006 09/22/16 0500  Weight: 87.9 kg (193 lb 12.6 oz) 93.7 kg (206 lb 9.1 oz)    History of present illness:  Patient coming from:    home Lives  With family    Chief Complaint: generalized weakness.  HPI: Savannah Travis is a 76 y.o. female with medical history significant of breast cancer, hypertension, depression, anxiety, hypercholesterolemia    Presented with progressive generalized weakness currently 2.4 she is wheelchair-bound and has trouble holding a spoon hand. She's been having intermittent headaches episodes of unresponsiveness where she has trouble verbalizing. She also developed urinary incontinence and started to be seen by urology.  December 15 she had an episode and since then she could not walk. Christmas day she was out for 15 min and got worse after that. 1st of December she was walking a with a cane now in a wheelchair.  Family describes she gives out a warning it  feels as a sharp pain in her head like a strike of lightning and after that sometimes the pain is so severe she is unable to respond some times she does not remember what happened. Always preceded by a headache sometimes so severe she falls down.  She feels weak all over and reports no control over any of her muscles.  Patient used to have syncopal episodes associated with bradycardia since her metoprolol was stopped those syncopal episodes have resolved.   Family states her fine motor skills have been affected. She has been loosing weight.  She had to cancel her mammogram Colonoscopy up to date.    Hospital Course:  Principal Problem:   CVA (cerebral vascular accident) ([place]) Active Problems:   Unresponsiveness   Hypokalemia   Debility   Dehydration   Prolonged QT interval   Hypertension   Acute encephalopathy   Dysarthria   UTI due to Klebsiella species   Pre-diabetes   Spinal stenosis of lumbar region without neurogenic claudication   Gait disorder   #1 acute CVA MRI patient with acute CVA in the left corona radiata, MRA with focal high-grade left M1 segment stenosis. Felt likely secondary small vessel disease. Patient with carotid Dopplers 08/08/2016 with no significant ICA stenosis. EEG done without any abnormality. 2-D echo with EF of [id number], grade 1 diastolic dysfunction, no source of emboli noted. LDL of 87. Hemoglobin A1c was 6.2 on 09/21/2016. Neurology initially recommended probable TEE, however stroke MD felt CVA was more likely secondary to small vessel disease and as such TEE not needed at this time. TEE has been cancelled. Patient  was on aspirin prior to admission ,now on clopidogrel 75 mg daily and ASA 325mg  daily. Continue DAPT for 3 months and then plavix alone due to intracranial stenosis. She is also on statin, Neurology following and appreciate input and recommendations.  patient had outpatient cardiac monitor by cardiology Dr Brayton Mars in 10-07/2016,  report placed on paper chart,  "30day event monitor reveals sinus rhythm, no pauses, no ectopy"  #2 acute encephalopathy Likely secondary to problem #1 There is also concerns for possible Parkinson's disease and seizure disease. She is discharged on increase dose of topamax ( from 25mg  daily to 25mg  bid) and increase dose of trileptal ( from 150 mg qam to 300mg  bid) She is to follow up with neurology Dr Leta Baptist for cva and possible seizure and parkinson's.  #3 hypokalemia Repleted.  #4 hypertension Permissive hypertension.  She is discharged on home meds norvasc  #5 acidosis Place on bicarbonate tablets. Possibly from chronic diarrhea, no significant diarrhea in the hospital  #6 Klebsiella pneumoniae UTI Change IV Rocephin to oral ceftin, she is discharged on augmentin to complete course of antibiotics.  #7 ?? Pre- diabetes Repeat HgbA1C = 5.6. Outpatient follow up. Diet modification    DVT prophylaxis: SCDs Code Status: DO NOT RESUSCITATE Family Communication: Updated patient and daughter at bedside. Disposition Plan: CIR when bed available and per neurology.    Consultants:   Neurology: Dr. Nicole Kindred 09/20/2016  Procedures:   2-D echo 09/22/2016  Chest x-ray 09/20/2016  MRI/MRA brain 09/21/2016  MRI CNL spine 09/22/2016  EEG 09/22/2016  MRI T spine 09/23/2016  Antimicrobials:    IV Rocephin 09/23/2016>>>>09/24/2016  Oral ceftin 09/24/2016   Discharge Exam: BP 130/74 (BP Location: Right Arm)   Pulse 85   Temp 97.9 F (36.6 C) (Oral)   Resp 20   Ht 4\' 8"  (1.422 m)   Wt 93.7 kg (206 lb 9.1 oz)   LMP  (LMP Unknown)   SpO2 97%   BMI 46.31 kg/m   General: NAD, sitting up in chair, very pleasant Cardiovascular: RRR Respiratory: CTABL Neuro: aaox3, mild right facial droop, tongue protrusion to the right, right sided hemiparesis  Discharge Instructions You were cared for by a hospitalist during your hospital stay. If you have any  questions about your discharge medications or the care you received while you were in the hospital after you are discharged, you can call the unit and asked to speak with the hospitalist on call if the hospitalist that took care of you is not available. Once you are discharged, your primary care physician will handle any further medical issues. Please note that NO REFILLS for any discharge medications will be authorized once you are discharged, as it is imperative that you return to your primary care physician (or establish a relationship with a primary care physician if you do not have one) for your aftercare needs so that they can reassess your need for medications and monitor your lab values.  Discharge Instructions    Diet - low sodium heart healthy    Complete by:  As directed    Carb modified diet   Increase activity slowly    Complete by:  As directed      Allergies as of 09/25/2016      Reactions   Demerol [meperidine] Nausea And Vomiting   Integra [fe Fum-fepoly-vit C-vit B3] Diarrhea   Magnesium Oxide Diarrhea   Metformin And Related Diarrhea   Sulfonylureas Swelling, Rash      Medication List  STOP taking these medications   aspirin 81 MG chewable tablet Replaced by:  aspirin 325 MG EC tablet   escitalopram 10 MG tablet Commonly known as:  LEXAPRO   LIBRAX 5-2.5 MG capsule Generic drug:  clidinium-chlordiazePOXIDE   MYRBETRIQ 50 MG Tb24 tablet Generic drug:  mirabegron ER   omeprazole 40 MG capsule Commonly known as:  PRILOSEC   Vitamin D3 5000 units Tabs     TAKE these medications   allopurinol 300 MG tablet Commonly known as:  ZYLOPRIM Take 300 mg by mouth every morning.   amLODipine 5 MG tablet Commonly known as:  NORVASC Take 5 mg by mouth every morning.   amoxicillin-clavulanate 875-125 MG tablet Commonly known as:  AUGMENTIN Take 1 tablet by mouth 2 (two) times daily.   aspirin 325 MG EC tablet Take 1 tablet (325 mg total) by mouth daily. Start  taking on:  09/26/2016 Replaces:  aspirin 81 MG chewable tablet   atorvastatin 40 MG tablet Commonly known as:  LIPITOR Take 40 mg by mouth at bedtime.   butalbital-acetaminophen-caffeine 50-325-40-30 MG capsule Commonly known as:  FIORICET WITH CODEINE Take 1 capsule by mouth every 4 (four) hours as needed for headache.   CALCIUM 500/D 500-200 MG-UNIT tablet Generic drug:  calcium-vitamin D Take 1 tablet by mouth at bedtime.   clonazePAM 1 MG tablet Commonly known as:  KLONOPIN Take 1 mg by mouth at bedtime as needed for anxiety.   clopidogrel 75 MG tablet Commonly known as:  PLAVIX Take 1 tablet (75 mg total) by mouth daily. Start taking on:  09/26/2016   CoQ10 200 MG Caps Take 200 mg by mouth 2 (two) times daily.   denosumab 60 MG/ML Soln injection Commonly known as:  PROLIA Inject 60 mg into the skin every 6 (six) months. Administer in upper arm, thigh, or abdomen   maprotiline 50 MG tablet Commonly known as:  LUDIOMIL Take 50 mg by mouth at bedtime.   meclizine 12.5 MG tablet Commonly known as:  ANTIVERT Take 12.5 mg by mouth daily as needed for dizziness.   montelukast 10 MG tablet Commonly known as:  SINGULAIR Take 10 mg by mouth daily as needed (for SOB).   MOVE FREE JOINT HEALTH ADVANCE PO Take 1 tablet by mouth every morning.   Oxcarbazepine 300 MG tablet Commonly known as:  TRILEPTAL Take 1 tablet (300 mg total) by mouth 2 (two) times daily. What changed:  medication strength  how much to take  when to take this   Pancrelipase (Lip-Prot-Amyl) 5000 units Cpep Take 5,000-10,000 Units by mouth daily as needed (diarrhea).   sodium bicarbonate 650 MG tablet Take 1 tablet (650 mg total) by mouth daily.   topiramate 25 MG tablet Commonly known as:  TOPAMAX Take 1 tablet (25 mg total) by mouth 2 (two) times daily. What changed:  when to take this      Allergies  Allergen Reactions  . Demerol [Meperidine] Nausea And Vomiting  . Integra [Fe  Fum-Fepoly-Vit C-Vit B3] Diarrhea  . Magnesium Oxide Diarrhea  . Metformin And Related Diarrhea  . Sulfonylureas Swelling and Rash   Follow-up Information    PENUMALLI,VIKRAM, MD Follow up in 1 month(s).   Specialties:  Neurology, Radiology Why:  for  seizure, parkinson's and stroke follow up. Contact information: [address] [place] [address] [phone]        Keane Police, MD Follow up in 1 month(s).   Specialty:  Family Medicine Why:  hospital discharge follow up  Contact information: [address] [place] [address] [phone]            The results of significant diagnostics from this hospitalization (including imaging, microbiology, ancillary and laboratory) are listed below for reference.    Significant Diagnostic Studies: Dg Chest 1 View  Result Date: 09/11/2016 CLINICAL DATA:  Weakness, "in a fog", no energy, and sleeping more x Friday EXAM: CHEST 1 VIEW COMPARISON:  None. FINDINGS: Normal mediastinum and cardiac silhouette. Normal pulmonary vasculature. No evidence of effusion, infiltrate, or pneumothorax. No acute bony abnormality. IMPRESSION: No acute cardiopulmonary process. Electronically Signed   By: Suzy Bouchard M.D.   On: 09/11/2016 12:09   Dg Chest 2 View  Result Date: 09/20/2016 CLINICAL DATA:  Transient ischemic attack. EXAM: CHEST  2 VIEW COMPARISON:  09/11/2016 FINDINGS: The heart size and mediastinal contours are within normal limits. Both lungs are clear. Minimal atelectasis at the left lung base. The visualized skeletal structures are unremarkable. IMPRESSION: No active cardiopulmonary disease. Electronically Signed   By: Ashley Royalty M.D.   On: 09/20/2016 23:24   Ct Head Wo Contrast  Result Date: 09/11/2016 CLINICAL DATA:  Weakness in the legs, status post fall EXAM: CT HEAD WITHOUT CONTRAST TECHNIQUE: Contiguous axial images were obtained from the base of the skull through the vertex without intravenous  contrast. COMPARISON:  None. FINDINGS: Brain: No evidence of acute infarction, hemorrhage, extra-axial collection, ventriculomegaly, or mass effect. Generalized cerebral atrophy. Periventricular white matter low attenuation likely secondary to microangiopathy. Vascular: Cerebrovascular atherosclerotic calcifications are noted. Skull: Negative for fracture or focal lesion. Sinuses/Orbits: Visualized portions of the orbits are unremarkable. Visualized portions of the paranasal sinuses and mastoid air cells are unremarkable. Other: None. IMPRESSION: No acute intracranial pathology. Electronically Signed   By: Kathreen Devoid   On: 09/11/2016 12:19   Mr Brain W Wo Contrast  Result Date: 09/21/2016 CLINICAL DATA:  TIA. Episode of unresponsiveness with subsequent headache. EXAM: MRI HEAD WITHOUT AND WITH CONTRAST MRA HEAD WITHOUT CONTRAST TECHNIQUE: Multiplanar, multiecho pulse sequences of the brain and surrounding structures were obtained without and with intravenous contrast. Angiographic images of the head were obtained using MRA technique without contrast. CONTRAST:  2mL MULTIHANCE GADOBENATE DIMEGLUMINE 529 MG/ML IV SOLN COMPARISON:  08/08/2016 FINDINGS: MRI HEAD FINDINGS Brain: Moderate area of restricted diffusion in the central left corona radiata, measuring up to 2 cm. On coronal acquisition there is subtle asymmetric DWI hyperintensity in the left cortical spinal tracts attributed to acute Wallerian changes. No infarct elsewhere. No acute hemorrhage. There is chronic advanced microvascular ischemic gliosis in the cerebral white matter, deep gray nuclei, and pons. Remote hemorrhagic infarct affecting the right caudate head and anterior limb internal capsule. Brain volume is normal. No hydrocephalus or mass. No focal cortical finding to suggest a seizure focus. No chronic lobar hemorrhagic foci. Symmetric hippocampal formations when accounting for developmental cystic changes. No abnormal intracranial  enhancement. Vascular: Arterial findings below. Normal dural venous sinus flow voids. Skull and upper cervical spine: Negative Sinuses/Orbits: Bilateral cataract resection.  No acute finding MRA HEAD FINDINGS Symmetric and smooth carotid arteries. Intact circle-of-Willis. Focal moderate to advanced left M1 segment stenosis after the anterior temporal branch. Elsewhere the vessels are smooth and widely patent. No generalized beading or aneurysm. IMPRESSION: 1. Acute infarct in the central left corona radiata, nonhemorrhagic. 2. Focal high-grade left M1 segment stenosis. Elsewhere negative intracranial MRA. 3. 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 Electronically Signed   By: Angelica Chessman  Watts M.D.   On: 09/21/2016 12:02   Mr Cervical Spine Wo Contrast  Result Date: 09/22/2016 CLINICAL DATA:  Lower extremity weakness worsening over the last month. EXAM: MRI CERVICAL SPINE WITHOUT CONTRAST TECHNIQUE: Multiplanar, multisequence MR imaging of the cervical spine was performed. No intravenous contrast was administered. COMPARISON:  08/08/2016 FINDINGS: Alignment: Straightening and slight kyphotic curvature. 3 mm anterolisthesis T1 to due to facet degeneration. Vertebrae: No fracture or significant bone lesion. Cord: No cord compression or primary cord lesion. Posterior Fossa, vertebral arteries, paraspinal tissues: Negative Disc levels: Foramen magnum and C1-2: Normal. C2-3: Facet osteoarthritis without edema. No disc pathology. No significant stenosis. C3-4: Facet osteoarthritis without edema. No disc pathology. No significant stenosis. C4-5: Facet arthropathy on the left without edema. Mild bulging of the disc. No compressive canal or foraminal narrowing. C5-6: Facet arthropathy on the left without edema. Moderate bulging of the disc. Narrowing of the ventral subarachnoid space but no compression of the cord. No significant foraminal narrowing. C6-7:  Normal interspace.  C7-T1: Bulging of the disc more towards the left. No apparent neural compression. Facet arthropathy on the left without edema. T1-2: Bilateral facet osteoarthritis with 3 mm of anterolisthesis. No compressive stenosis. IMPRESSION: No acute or likely significant finding in the cervical region. No change since the study of November. Degenerative facet arthropathy left worse than right. Mild disc degeneration. No compressive stenosis of the canal or foramina. No primary cord lesion. Electronically Signed   By: Nelson Chimes M.D.   On: 09/22/2016 17:01   Mr Thoracic Spine Wo Contrast  Result Date: 09/23/2016 CLINICAL DATA:  Weakness and inability to ambulate. EXAM: MRI THORACIC SPINE WITHOUT CONTRAST TECHNIQUE: Multiplanar, multisequence MR imaging of the thoracic spine was performed. No intravenous contrast was administered. COMPARISON:  Cervical and lumbar spine MRI examinations from yesterday. FINDINGS: Alignment:  Scoliosis but normal alignment in the sagittal plane. Vertebrae: Normal marrow signal except for a few small scattered hemangiomas. Mild endplate reactive changes are noted at T12-L1. Cord: Normal signal intensity. No cord lesions or syrinx. The conus medullaris is normal. Paraspinal and other soft tissues: No significant findings. Disc levels: Shallow disc protrusion at T2-3 with mild flattening of the ventral thecal sac. No other significant disc protrusions, spinal or foraminal stenosis involving the thoracic spine. Minimal disc bulges at T11-12 and T12-L1. IMPRESSION: Thoracic scoliosis. Shallow disc protrusion at T2-3 without significant neural compression. No other disc protrusions, spinal or foraminal stenosis in the thoracic spine. Normal MR appearance of the thoracic spinal cord. Electronically Signed   By: Marijo Sanes M.D.   On: 09/23/2016 18:44   Mr Lumbar Spine Wo Contrast  Result Date: 09/22/2016 CLINICAL DATA:  Lower extremity weakness worsening over the last month. EXAM: MRI  LUMBAR SPINE WITHOUT CONTRAST TECHNIQUE: Multiplanar, multisequence MR imaging of the lumbar spine was performed. No intravenous contrast was administered. COMPARISON:  None. FINDINGS: Segmentation:  5 lumbar type vertebral bodies. Alignment:  Mild curvature convex to the right with the apex at L3. Vertebrae: Old compression fracture at L5 with loss of height of 50%. Posterior bowing of the posterosuperior margin of the vertebral body . No other focal lumbar lesion. Conus medullaris: Extends to the T12 level and appears normal. Paraspinal and other soft tissues: Negative Disc levels: Mild bulging of the discs from T9-10 through L1-2. No compressive narrowing of the canal or foramina. L2-3: Disc degeneration with endplate osteophytes and bulging of the disc. This is more prominent towards the right. Mild stenosis of the lateral recesses  and foramina right more than left. No definite neural compression. L3-4: Mild bulging of the disc. Mild facet hypertrophy. No stenosis. L4-5: Bulging of the disc. Posterior bowing of the posterosuperior margin of the vertebral body. This encroaches upon the spinal canal. Facet and ligamentous hypertrophy. Moderate stenosis at this level that could possibly be symptomatic. L5-S1: Bulging of the disc. Bilateral facet arthropathy with gaping, fluid-filled facet joints. Anterolisthesis could occur at this level with standing or flexion. Narrowing of the subarticular lateral recesses and to a lesser degree the neural foramina. This could possibly be significant, particularly with standing. IMPRESSION: No likely significant finding at L3-4 or above. Old compression fracture at L5 which is healed. Posterior bowing of the posterosuperior margin of the vertebral body. Mild bulging of the L4-5 disc. In combination with the L5 bowing hand with bilateral facet and ligamentous hypertrophy, there is moderate stenosis at this level that could possibly cause neural compression. L5-S1 facet  arthropathy with gaping, fluid-filled facet joints. Mild bulging of the disc. Mild narrowing of the subarticular lateral recesses and neural foramina. This appearance would likely worsen with standing or flexion based on the appearance of the facet arthropathy. Electronically Signed   By: Nelson Chimes M.D.   On: 09/22/2016 17:05   Dg Knee Complete 4 Views Left  Result Date: 09/11/2016 CLINICAL DATA:  Left anterior knee pain this morning. EXAM: LEFT KNEE - COMPLETE 4+ VIEW COMPARISON:  None. FINDINGS: No acute fracture dislocation. Generalized osteopenia. Tricompartmental osteoarthritis of the left knee. No significant joint effusion. IMPRESSION: No acute osseous injury of the left knee. Electronically Signed   By: Kathreen Devoid   On: 09/11/2016 12:07   Mr Jodene Nam Head/brain Wo Cm  Result Date: 09/21/2016 CLINICAL DATA:  TIA. Episode of unresponsiveness with subsequent headache. EXAM: MRI HEAD WITHOUT AND WITH CONTRAST MRA HEAD WITHOUT CONTRAST TECHNIQUE: Multiplanar, multiecho pulse sequences of the brain and surrounding structures were obtained without and with intravenous contrast. Angiographic images of the head were obtained using MRA technique without contrast. CONTRAST:  21mL MULTIHANCE GADOBENATE DIMEGLUMINE 529 MG/ML IV SOLN COMPARISON:  08/08/2016 FINDINGS: MRI HEAD FINDINGS Brain: Moderate area of restricted diffusion in the central left corona radiata, measuring up to 2 cm. On coronal acquisition there is subtle asymmetric DWI hyperintensity in the left cortical spinal tracts attributed to acute Wallerian changes. No infarct elsewhere. No acute hemorrhage. There is chronic advanced microvascular ischemic gliosis in the cerebral white matter, deep gray nuclei, and pons. Remote hemorrhagic infarct affecting the right caudate head and anterior limb internal capsule. Brain volume is normal. No hydrocephalus or mass. No focal cortical finding to suggest a seizure focus. No chronic lobar hemorrhagic foci.  Symmetric hippocampal formations when accounting for developmental cystic changes. No abnormal intracranial enhancement. Vascular: Arterial findings below. Normal dural venous sinus flow voids. Skull and upper cervical spine: Negative Sinuses/Orbits: Bilateral cataract resection.  No acute finding MRA HEAD FINDINGS Symmetric and smooth carotid arteries. Intact circle-of-Willis. Focal moderate to advanced left M1 segment stenosis after the anterior temporal branch. Elsewhere the vessels are smooth and widely patent. No generalized beading or aneurysm. IMPRESSION: 1. Acute infarct in the central left corona radiata, nonhemorrhagic. 2. Focal high-grade left M1 segment stenosis. Elsewhere negative intracranial MRA. 3. 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 Electronically Signed   By: Monte Fantasia M.D.   On: 09/21/2016 12:02    Microbiology: Recent Results (from the past 240 hour(s))  Culture, Urine     Status: Abnormal  Collection Time: 09/20/16  8:21 PM  Result Value Ref Range Status   Specimen Description URINE, RANDOM  Final   Special Requests NONE  Final   Culture >=100,000 COLONIES/mL KLEBSIELLA PNEUMONIAE (A)  Final   Report Status 09/24/2016 FINAL  Final   Organism ID, Bacteria KLEBSIELLA PNEUMONIAE (A)  Final      Susceptibility   Klebsiella pneumoniae - MIC*    AMPICILLIN >=32 RESISTANT Resistant     CEFAZOLIN <=4 SENSITIVE Sensitive     CEFTRIAXONE <=1 SENSITIVE Sensitive     CIPROFLOXACIN <=0.25 SENSITIVE Sensitive     GENTAMICIN <=1 SENSITIVE Sensitive     IMIPENEM <=0.25 SENSITIVE Sensitive     NITROFURANTOIN 64 INTERMEDIATE Intermediate     TRIMETH/SULFA <=20 SENSITIVE Sensitive     AMPICILLIN/SULBACTAM 4 SENSITIVE Sensitive     PIP/TAZO <=4 SENSITIVE Sensitive     Extended ESBL NEGATIVE Sensitive     * >=100,000 COLONIES/mL KLEBSIELLA PNEUMONIAE     Labs: Basic Metabolic Panel:  Recent Labs Lab 09/20/16 1651 09/20/16 2212  09/22/16 0433 09/23/16 0444 09/24/16 0452  NA 140  --  141 140 140  K 3.2*  --  3.5 3.4* 3.8  CL 108  --  111 111 110  CO2 20*  --  18* 24 19*  GLUCOSE 174*  --  110* 105* 113*  BUN 11  --  13 14 18   CREATININE 1.00  --  1.06* 0.97 1.09*  CALCIUM 9.3  --  8.6* 8.6* 8.8*  MG  --  1.8  --   --   --   PHOS  --  2.9  --   --   --    Liver Function Tests:  Recent Labs Lab 09/20/16 1651  AST 21  ALT 14  ALKPHOS 96  BILITOT 0.3  PROT 6.9  ALBUMIN 3.8   No results for input(s): LIPASE, AMYLASE in the last 168 hours.  Recent Labs Lab 09/20/16 2212  AMMONIA 32   CBC:  Recent Labs Lab 09/20/16 1651 09/22/16 0433 09/23/16 0444  WBC 8.2 7.1 6.2  NEUTROABS 5.6  --   --   HGB 12.8 11.2* 11.8*  HCT 40.3 36.1 37.2  MCV 89.6 88.7 88.4  PLT 292 273 286   Cardiac Enzymes:  Recent Labs Lab 09/20/16 2212  09/23/16 2238 09/24/16 0452 09/24/16 1018 09/24/16 1621 09/24/16 2328  CKTOTAL 69  --   --   --   --   --   --   TROPONINI <0.03  < > <0.03 <0.03 <0.03 <0.03 <0.03  < > = values in this interval not displayed. BNP: BNP (last 3 results)  Recent Labs  08/02/16 1542  BNP 32.4    ProBNP (last 3 results) No results for input(s): PROBNP in the last 8760 hours.  CBG:  Recent Labs Lab 09/20/16 1633  GLUCAP 181*       Signed:  Tiwanna Tuch MD, PhD  [facility] 09/25/2016, 12:55 PM

## 2016-09-25 NOTE — Progress Notes (Signed)
Inpatient Rehabilitation  I met with the patient and spoke with her daughter over the phone to discuss the recommendation for IP Rehab.  Pt. and daughter very much want pt. to come to CIR.  I spoke with Dr. Florencia Reasons who gives medical clearance for admission today. I have updated Jacqualin Combes, RNCM and pt's RN Shaun of the plan.  I will make all necessary arrangements for admission to occur later today.  Please call if questions.  [facility] Admissions Coordinator Cell [phone] Office [phone]

## 2016-09-25 NOTE — Progress Notes (Signed)
STROKE TEAM PROGRESS NOTE   SUBJECTIVE (INTERVAL HISTORY) Husband at bedside. Pt sitting in chair, no complains. Pending CIR today. Left sided weakness improved.    OBJECTIVE Temp:  [97.8 F (36.6 C)-98.2 F (36.8 C)] 97.9 F (36.6 C) (01/02 1052) Pulse Rate:  [74-89] 85 (01/02 1052) Cardiac Rhythm: Normal sinus rhythm (01/02 0700) Resp:  [17-20] 20 (01/02 1052) BP: (119-150)/(58-74) 130/74 (01/02 1052) SpO2:  [93 %-97 %] 97 % (01/02 1052)  CBC:   Recent Labs Lab 09/20/16 1651 09/22/16 0433 09/23/16 0444  WBC 8.2 7.1 6.2  NEUTROABS 5.6  --   --   HGB 12.8 11.2* 11.8*  HCT 40.3 36.1 37.2  MCV 89.6 88.7 88.4  PLT 292 273 [id number]    Basic Metabolic Panel:   Recent Labs Lab 09/20/16 2212  09/23/16 0444 09/24/16 0452  NA  --   < > 140 140  K  --   < > 3.4* 3.8  CL  --   < > 111 110  CO2  --   < > 24 19*  GLUCOSE  --   < > 105* 113*  BUN  --   < > 14 18  CREATININE  --   < > 0.97 1.09*  CALCIUM  --   < > 8.6* 8.8*  MG 1.8  --   --   --   PHOS 2.9  --   --   --   < > = values in this interval not displayed.  Lipid Panel:     Component Value Date/Time   CHOL 156 09/22/2016 0433   TRIG 111 09/22/2016 0433   HDL 47 09/22/2016 0433   CHOLHDL 3.3 09/22/2016 0433   VLDL 22 09/22/2016 0433   LDLCALC 87 09/22/2016 0433   HgbA1c:  Lab Results  Component Value Date   HGBA1C 5.6 09/22/2016   Urine Drug Screen: No results found for: LABOPIA, COCAINSCRNUR, LABBENZ, AMPHETMU, THCU, LABBARB    IMAGING I have personally reviewed the radiological images below and agree with the radiology interpretations.  Ct Head Wo Contrast 09/11/2016 IMPRESSION: No acute intracranial pathology.   Mr Jeri Cos Wo Contrast 09/21/2016 IMPRESSION: 1. Acute infarct in the central left corona radiata, nonhemorrhagic. 2. Focal high-grade left M1 segment stenosis. Elsewhere negative intracranial MRA. 3. 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    Mr Cervical Spine Wo Contrast 09/22/2016 IMPRESSION: No acute or likely significant finding in the cervical region. No change since the study of November. Degenerative facet arthropathy left worse than right. Mild disc degeneration. No compressive stenosis of the canal or foramina. No primary cord lesion.   Mr Thoracic Spine Wo Contrast 09/23/2016 IMPRESSION: Thoracic scoliosis. Shallow disc protrusion at T2-3 without significant neural compression. No other disc protrusions, spinal or foraminal stenosis in the thoracic spine. Normal MR appearance of the thoracic spinal cord.   Mr Lumbar Spine Wo Contrast 09/22/2016 IMPRESSION: No likely significant finding at L3-4 or above. Old compression fracture at L5 which is healed. Posterior bowing of the posterosuperior margin of the vertebral body. Mild bulging of the L4-5 disc. In combination with the L5 bowing hand with bilateral facet and ligamentous hypertrophy, there is moderate stenosis at this level that could possibly cause neural compression. L5-S1 facet arthropathy with gaping, fluid-filled facet joints. Mild bulging of the disc. Mild narrowing of the subarticular lateral recesses and neural foramina. This appearance would likely worsen with standing or flexion based on the appearance of the facet arthropathy. 5  TTE - Left ventricle: The cavity size was normal. Systolic function was   normal. The estimated ejection fraction was in the range of 60%   to 65%. Wall motion was normal; there were no regional wall   motion abnormalities. Doppler parameters are consistent with   abnormal left ventricular relaxation (grade 1 diastolic   dysfunction) Impressions: - Compared to the prior study, there has been no significant   interval change.  Carotid Doppler - 08/08/2016 - normal carotid and vertebral arteries.  EEG - The EEG is normal in awake and drowsy state.  30 day cardiac event monitoring - summer 2017 in [place] - record on  request   PHYSICAL EXAM  Temp:  [97.8 F (36.6 C)-98.2 F (36.8 C)] 97.9 F (36.6 C) (01/02 1052) Pulse Rate:  [74-89] 85 (01/02 1052) Resp:  [17-20] 20 (01/02 1052) BP: (119-150)/(58-74) 130/74 (01/02 1052) SpO2:  [93 %-97 %] 97 % (01/02 1052)  General - Well nourished, well developed, in no apparent distress.  Ophthalmologic - Fundi not visualized due to noncooperation.  Cardiovascular - Regular rate and rhythm.  Mental Status -  Level of arousal and orientation to time, place, and person were intact. Language including expression, naming, repetition, comprehension was assessed and found intact. Fund of Knowledge was assessed and was impaired.  Cranial Nerves II - XII - II - Visual field intact OU. III, IV, VI - Extraocular movements intact. V - Facial sensation intact bilaterally. VII - mild right facial droop. VIII - Hearing & vestibular intact bilaterally. X - Palate elevates symmetrically. XI - Chin turning & shoulder shrug intact bilaterally. XII - Tongue protrusion to the right  Motor Strength - The patient's strength was 4/5 LUE and LLE, but 4-/5 RUE and 4/5 RLE.  Bulk was normal and fasciculations were absent.   Motor Tone - Muscle tone was assessed at the neck and appendages and was normal.  Reflexes - The patient's reflexes were 1+ in all extremities and she had no pathological reflexes.  Sensory - Light touch, temperature/pinprick were assessed and were symmetrical.    Coordination - The patient had normal movements in the hands with no ataxia or dysmetria.  Tremor was absent.  Gait and Station - deferred.   ASSESSMENT/PLAN Ms. Savannah Travis is a 76 y.o. female with history of hypertension, breast cancer, hyperlipidemia and depression  presenting with unresponsive episodes, dysphagia, confusion, and progressive weakness. She did not receive IV t-PA due to unknown time of onset.  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  Carotid Doppler - 08/08/2016 - normal carotid and vertebral arteries.  EEG normal  2D Echo - EF 60%  to 65%  30 day cardiac event monitoring done in 02/2016 in [place], [place]. Record on request  LDL - 87  HgbA1c 5.6  VTE prophylaxis - SCDs Diet heart healthy/carb modified Room service appropriate? Yes; Fluid consistency: Thin Diet - low sodium heart healthy  aspirin 81 mg daily prior to admission, now on clopidogrel 75 mg daily and ASA 325mg  daily. Continue DAPT for 3 months and then plavix alone due to intracranial stenosis.   Patient counseled to be compliant with her antithrombotic medications  Ongoing aggressive stroke risk factor management  Therapy recommendations:CIR  Disposition: Pending  Episodes of passing out  Continue to occur after stopping beta blocker  Concerning for complex partial seizure  Follow up with Dr. Leta [facility] at [facility]  EEG negative  On trileptal 150mg  daily - increase to 300mg  bid   30 day cardiac event monitoring done last summer in [place] - record on request  Walking difficulty  Progressive walking difficulty  MRI C/T/L spine unrevealing  Denies significant back pain  Complicated by left CR infarct with right sided hemiparesis  CIR recommended  Follow up with Dr. Leta Baptist as outpt - may consider EMG/NCS  Migraine   On topamax 25mg  daily  Pt HA did not improve  Increase topamax to 25mg  bid  Follow up with Dr. Leta Baptist as outpt  Hypertension  Stable  Permissive hypertension (OK if < 220/120) but gradually normalize in 5-7 days  Long-term BP goal normotensive  Hyperlipidemia  Home meds: Lipitor 40 mg daily resumed in hospital  LDL 87, goal < 70  Continue statin at discharge  Other Stroke Risk Factors  Advanced age  Obesity, Body mass index  is 46.31 kg/m., recommend weight loss, diet and exercise as appropriate   Other Active Problems  UTI - UA WBC 6-30 - on ceftin  Hypokalemia  Hospital day # 4  Neurology will sign off. Please call with questions. Pt will follow up with Dr. Leta Baptist at [facility] in about 6 weeks. Thanks for the consult.  Rosalin Hawking, MD PhD Stroke Neurology 09/25/2016 4:40 PM   To contact Stroke Continuity provider, please refer to [email or web address] After hours, contact General Neurology

## 2016-09-25 NOTE — PMR Pre-admission (Signed)
PMR Admission Coordinator Pre-Admission Assessment  Patient: Savannah Travis is an 76 y.o., female MRN: [id number] DOB: Dec 08, 1940 Height: 4' 8"  (142.2 cm) Weight: 93.7 kg (206 lb 9.1 oz)              Insurance Information HMO:     PPO:      PCP:      IPA:      80/20:      OTHER:  PRIMARY: Medicare A and B      Policy#:  [id number]      Subscriber:  self CM Name:        Phone#:      Fax#:  Pre-Cert#:       Employer: retired Benefits:  Phone #:       Name:   Eff. Date:  08/24/06     Deduct:  $1340      Out of Pocket Max:  n/a      Life Max:  n/a CIR:  100% after deductible met      SNF:  100% first 20 days Outpatient:  80%/20%     Co-Pay:  Home Health:  100%      Co-Pay:   DME:  80%/20%     Co-Pay:  Providers:  Pt. choice SECONDARY:  [employer]      Policy#:  [id number]      Subscriber:  self CM Name:       Phone#:      Fax#:  Pre-Cert#:       Employer:  Benefits:  Phone #:      Name:  Eff. Date:      Deduct:       Out of Pocket Max:       Life Max:  CIR:       SNF:  Outpatient:      Co-Pay:  Home Health:       Co-Pay:  DME:      Co-Pay:   Medicaid Application Date:       Case Manager:  Disability Application Date:       Case Worker:   Emergency Contact Information Contact Information    Name Relation Home Work [place] [phone]  [phone]   [place] Daughter   [phone]   [place], [place]    [phone]     Current Medical History  Patient Admitting Diagnosis: left corona radiata infarct History of Present Illness: Savannah Travis a 76 y.o.right handed femalewith history of Depression, breast cancer, migraine headaches and her chart review patient lives with spouse. Independent with a walker for short distances. One level home with ramped entrance. She has had some recent falls.. Presented 09/20/2016 with episode of unresponsiveness while riding in a car with her family and intermittent headaches. She began to respond after 10-15 minutes. Family  had reported 3 such events since 09/17/2016 as well as complaints of progressive weakness and decrease in mobility. She had had a 30 day cardiac event monitor done last summer in [place] records were not yet available. 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 MRA with focal high-grade left M1 segment stenosis elsewhere negative intracranial MRA. Patient did not receive TPA. Echocardiogram with ejection fraction of 97% grade 1 diastolic dysfunction. EEG negative. A carotid Doppler 08/08/2016 was negative. Neurology consulted and maintained on aspirin and Plavix for CVA prophylaxis 3 months then Plavix alone. Findings of questionable prediabetes mellitus with hemoglobin A1c 5.6. Klebsiella UTI intravenous Rocephin transitioned to oral Ceftin. MRI cervical  lumbar thoracic spine completed due to persistent weakness of lower extremities and felt to be unremarkable as evaluated by Dr. Kathyrn Sheriff and plan follow-up outpatient with Dr. Leta Baptist to consider possible EMG. Physical and occupational therapy evaluations completed 09/21/2016 with recommendations of physical medicine rehabilitation consult. Patient was admitted for a comprehensive rehabilitation program  Total: 2 NIH    Past Medical History  Past Medical History:  Diagnosis Date  . Cancer (HCC)    breast  . Headache    migraines  . Hypertension   . S/P wrist surgery 2015, 2017   for fx    Family History  family history includes Breast cancer in her sister; CAD in her father; Cancer in her sister; Colon cancer in her sister; Stroke in her father and other.  Prior Rehab/Hospitalizations:  Has the patient had major surgery during 100 days prior to admission? No, though pt. Did have wrist surgery in July 2017  Current Medications   Current Facility-Administered Medications:  .   stroke: mapping our early stages of recovery book, , Does not apply, Once, Toy Baker, MD, Stopped at 09/20/16 2238 .   acetaminophen (TYLENOL) tablet 650 mg, 650 mg, Oral, Q4H PRN, 650 mg at 09/24/16 1159 **OR** acetaminophen (TYLENOL) solution 650 mg, 650 mg, Per Tube, Q4H PRN **OR** acetaminophen (TYLENOL) suppository 650 mg, 650 mg, Rectal, Q4H PRN, Toy Baker, MD .  allopurinol (ZYLOPRIM) tablet 300 mg, 300 mg, Oral, Daily, Toy Baker, MD, 300 mg at 09/25/16 1052 .  amLODipine (NORVASC) tablet 5 mg, 5 mg, Oral, Daily, Toy Baker, MD, 5 mg at 09/25/16 1052 .  aspirin EC tablet 325 mg, 325 mg, Oral, Daily, Rosalin Hawking, MD, 325 mg at 09/25/16 1052 .  atorvastatin (LIPITOR) tablet 40 mg, 40 mg, Oral, QHS, Rosalin Hawking, MD, 40 mg at 09/24/16 2207 .  cefUROXime (CEFTIN) tablet 500 mg, 500 mg, Oral, BID WC, Eugenie Filler, MD, 500 mg at 09/25/16 0748 .  clonazePAM (KLONOPIN) tablet 1 mg, 1 mg, Oral, QHS PRN, Toy Baker, MD, 1 mg at 09/24/16 2207 .  clopidogrel (PLAVIX) tablet 75 mg, 75 mg, Oral, Daily, Eugenie Filler, MD, 75 mg at 09/25/16 1052 .  maprotiline (LUDIOMIL) tablet 50 mg, 50 mg, Oral, QHS, Toy Baker, MD, 50 mg at 09/24/16 2206 .  montelukast (SINGULAIR) tablet 10 mg, 10 mg, Oral, Daily PRN, Toy Baker, MD .  Oxcarbazepine (TRILEPTAL) tablet 300 mg, 300 mg, Oral, BID, Rosalin Hawking, MD, 300 mg at 09/25/16 1052 .  sodium bicarbonate tablet 650 mg, 650 mg, Oral, BID, Eugenie Filler, MD, 650 mg at 09/25/16 1053 .  topiramate (TOPAMAX) tablet 25 mg, 25 mg, Oral, BID, Rosalin Hawking, MD, 25 mg at 09/25/16 1052  Patients Current Diet: Diet heart healthy/carb modified Room service appropriate? Yes; Fluid consistency: Thin Diet - low sodium heart healthy  Precautions / Restrictions Precautions Precautions: Fall Restrictions Weight Bearing Restrictions: No   Has the patient had 2 or more falls or a fall with injury in the past year?Yes  Prior Activity Level Limited Community (1-2x/wk): Pt. says she goes out to MD appointments as needed and has recently been  going for OPPT for wrist fracture  Home Assistive Devices / [place] Devices/Equipment: Environmental consultant (specify type), Wheelchair Home Equipment: Environmental consultant - 2 wheels, Shower seat, Bedside commode, Cane - single point, Wheelchair - manual  Prior Device Use: Indicate devices/aids used by the patient prior to current illness, exacerbation or injury? Manual wheelchair and Gilford Rile  Prior Functional Level  Prior Function Level of Independence: Independent with assistive device(s) Comments: Pt. reports she has progressively been requiring the assist of cane, rolling walker and wheelchair  Self Care: Did the patient need help bathing, dressing, using the toilet or eating?  Needed some help with bathing  Indoor Mobility: Did the patient need assistance with walking from room to room (with or without device)? Independent  Stairs: Did the patient need assistance with internal or external stairs (with or without device)? Dependent, using ramp  Functional Cognition: Did the patient need help planning regular tasks such as shopping or remembering to take medications? Needed some help; daughter filled pill boxes, husband keeps track of appointments  Current Functional Level Cognition  Arousal/Alertness: Awake/alert Overall Cognitive Status: History of cognitive impairments - at baseline Orientation Level: Oriented X4 General Comments: Pt able to report "i had a stroke" pt reports diffculty expressing words. pt reports outpatient therapy of L wrist due to fall in June with break.  Attention: Focused Focused Attention: Appears intact Memory: Appears intact Awareness: Appears intact    Extremity Assessment (includes Sensation/Coordination)  Upper Extremity Assessment: Generalized weakness, RUE deficits/detail, LUE deficits/detail RUE Deficits / Details: decr shoulder flexion 30 degrees against gravity 3- out 5 mmt, brunstrum III grasp LUE Deficits / Details: unable to complete close fist.  brunstrom II full passive wrist extension,  decr shoulder flexion 30 degrees against gravity  Lower Extremity Assessment: Defer to PT evaluation RLE Deficits / Details: limited strength in RLE grossly 2/5 strength during isolated assessment RLE Coordination: decreased fine motor    ADLs  Overall ADL's : Needs assistance/impaired Grooming: Wash/dry hands, Wash/dry face, Sitting, Minimal assistance Upper Body Bathing: Moderate assistance Lower Body Bathing: Total assistance Toilet Transfer: +2 for physical assistance, Minimal assistance, Stand-pivot Toileting - Clothing Manipulation Details (indicate cue type and reason): in continence noted on arrival due to x2 techs helping with hyigene bed level  General ADL Comments: pt requires weight shift R to L to help facilitate stepping. pt dragging R LE. pt with decr gait length and velocity noted    Mobility  Overal bed mobility: Needs Assistance Bed Mobility: Supine to Sit Rolling: Min guard Sidelying to sit: Mod assist Supine to sit: Min assist General bed mobility comments: Assist to scoot fully to EOB. Bed pad used to complete scooting. Pt with difficulty getting feet on floor due to her height.     Transfers  Overall transfer level: Needs assistance Equipment used: Rolling walker (2 wheeled) Transfers: Sit to/from Stand Sit to Stand: Mod assist General transfer comment: Assist to power-up to full standing position. First attempt with 2 person HHA and second attempt with RW. Pt did better with RW and required less support for balance.     Ambulation / Gait / Stairs / Wheelchair Mobility  Ambulation/Gait Ambulation/Gait assistance: Min assist, Mod assist, +2 physical assistance Ambulation Distance (Feet): 10 Feet Assistive device: Rolling walker (2 wheeled), 2 person hand held assist Gait Pattern/deviations: Step-to pattern, Decreased stride length, Trunk flexed, Narrow base of support General Gait Details: With 2 person HHA increased  assist was required for weight shift and advancement of RLE. With RW, pt was able to take steps without therapist assist for RLE advancement, however fatigued very quickly.  Gait velocity: decreased Gait velocity interpretation: <1.8 ft/sec, indicative of risk for recurrent falls    Posture / Balance Dynamic Sitting Balance Sitting balance - Comments: patient reports increased right lateral lean recently, able to improve midline positioning with over compensation to the  left ( reaching for foot board holding it and then returning to neurtal static sitting) Balance Overall balance assessment: Needs assistance, History of Falls Sitting-balance support: Bilateral upper extremity supported, Feet supported Sitting balance-Leahy Scale: Poor Sitting balance - Comments: patient reports increased right lateral lean recently, able to improve midline positioning with over compensation to the left ( reaching for foot board holding it and then returning to neurtal static sitting) Postural control: Right lateral lean Standing balance support: During functional activity, Bilateral upper extremity supported Standing balance-Leahy Scale: Poor Standing balance comment: reliance on UE support for stability and upright positioning    Special needs/care consideration BiPAP/CPAP   no CPM  no Continuous Drip IV   no Dialysis  no        Life Vest   no Oxygen   no Special Bed   no Trach Size   n/a Wound Vac (area)  no       Skin bruising on hands from IV's                               Bowel mgmt:   Last BM 09/23/16, continent Bladder mgmt: incontinent, wears a pad and depends at home Diabetic mgmt A1c 5.6     Previous Home Environment Living Arrangements: Spouse/significant other  Lives With: Spouse Available Help at Discharge: Family, Available 24 hours/day Type of Home: House Home Layout: One level Home Access: Ramped entrance Bathroom Shower/Tub: Multimedia programmer: Handicapped  height Bathroom Accessibility: Yes How Accessible: Accessible via walker, Accessible via wheelchair Home Care Services: Yes Additional Comments: Pt. denies home care but has been going to OPPT for following wrist surgery  Discharge Living Setting Plans for Discharge Living Setting: Patient's home Type of Home at Discharge: House Discharge Home Layout: One level Discharge Home Access: [place] entrance Discharge Bathroom Shower/Tub: Walk-in shower Discharge Bathroom Toilet: Handicapped height Discharge Bathroom Accessibility: Yes How Accessible: Accessible via wheelchair, Accessible via walker Does the patient have any problems obtaining your medications?: No  Social/Family/Support Systems Patient Roles: Spouse, Parent Anticipated Caregiver: Maimouna Rondeau, husband will be primary caregiver with daughter Clint Lipps checking in of her parents several times per week Anticipated Caregiver's Contact Information: Larsen Zettel, [phone] home, [phone] cell Ability/Limitations of Caregiver: Per pt's daughter, her father Jeneen Rinks can provide supervision as needed but due to his own health issues, cannot provide physical assistance.  [employer] are for supervision and was explained to pt. , husband and daughter. Caregiver Availability: 24/7 Discharge Plan Discussed with Primary Caregiver: Yes Is Caregiver In Agreement with Plan?: Yes Does Caregiver/Family have Issues with Lodging/Transportation while Pt is in Rehab?: No   Goals/Additional Needs Patient/Family Goal for Rehab: supervision PT/OT; modified independent and supervision SLP Expected length of stay: 13-18 days Cultural Considerations: "[employer]" Dietary Needs: heart healthy, carb modified diet with thin liquids Equipment Needs: TBA Additional Information: Daughter Jenny Reichmann states she has been suggesting to her parents that the time is nearing for the need for ALF living however parents have not been receptive to this idea so  far. Pt/Family Agrees to Admission and willing to participate: Yes Program Orientation Provided & Reviewed with Pt/Caregiver Including Roles  & Responsibilities: Yes   Decrease burden of Care through IP rehab admission: n/a   Possible need for SNF placement upon discharge:   Not anticipated   Patient Condition: This patient's medical and functional status has changed since the consult dated: 09/22/16 in which the  Rehabilitation Physician determined and documented that the patient's condition is appropriate for intensive rehabilitative care in an inpatient rehabilitation facility. See "History of Present Illness" (above) for medical update. Functional changes are: pt. Required min and mod assistance for ambulation 10' with RW and with 2 person hand hold assist; pt. requires total assist for lower body bathing and mod assist for upper body bathing; pt. With ongoing dysarthria . Patient's medical and functional status update has been discussed with the Rehabilitation physician and patient remains appropriate for inpatient rehabilitation. Will admit to inpatient rehab today.   Preadmission Screen Completed By:  Gerlean Ren, 09/25/2016 1:55 PM ______________________________________________________________________   Discussed status with Dr.  Naaman Plummer on 09/25/16 at  1354  and received telephone approval for admission today.  Admission Coordinator:  Gerlean Ren, time 1354 Sudie Grumbling 09/25/16

## 2016-09-25 NOTE — Progress Notes (Signed)
Occupational Therapy Treatment Patient Details Name: Savannah Travis MRN: [id number] DOB: 08-25-1941 Today's Date: 09/25/2016    History of present illness 76 y.o. female with a history of hypertension, hyperlipidemia and depression who was brought to the emergency room by family following an episode of unresponsiveness. 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    OT comments  Pt progressing well toward OT goals. Pt able to complete toilet transfer with short distance ambulation to [facility] initially with mod assist +2 for safety but able to progress to mod assist +1. Pt completed grooming tasks at bed level after set-up to wash hands and face after returning to bed. Pt and husband educated on expectations for rehabilitation stay as she is planning to D/C to CIR this afternoon. OT will continue to follow acutely to improve independence with ADL and functional mobility while admitted. Continue to recommend CIR placement for continued rehabilitation in order to facilitate maximum return to PLOF.   Follow Up Recommendations  CIR    Equipment Recommendations  Other (comment) (Defer to next venue of care.)    Recommendations for Other Services Rehab consult    Precautions / Restrictions Precautions Precautions: Fall Restrictions Weight Bearing Restrictions: No       Mobility Bed Mobility Overal bed mobility: Needs Assistance Bed Mobility: Sit to Supine       Sit to supine: Min assist   General bed mobility comments: Assist to bring B LEs into bed.  Transfers Overall transfer level: Needs assistance Equipment used: Rolling walker (2 wheeled) Transfers: Sit to/from Stand Sit to Stand: Mod assist         General transfer comment: Pt able to complete with RW this session (B UE function is much improved) and requiring mod assist to power up to full standing. VC's required for hand placement.    Balance Overall balance assessment: Needs  assistance;History of Falls Sitting-balance support: Bilateral upper extremity supported;Feet supported Sitting balance-Leahy Scale: Poor     Standing balance support: During functional activity;Bilateral upper extremity supported Standing balance-Leahy Scale: Poor Standing balance comment: reliance on UE support for stability and upright positioning                   ADL Overall ADL's : Needs assistance/impaired     Grooming: Wash/dry hands;Wash/dry face;Set up;Bed level Grooming Details (indicate cue type and reason): Pt washed hands and face after laying down following toilet transfer.                 Toilet Transfer: Moderate assistance;Ambulation;RW;BSC Toilet Transfer Details (indicate cue type and reason): Pt able to complete short distance ambulation to [facility] in room with  mod assist. Toileting- Clothing Manipulation and Hygiene: Moderate assistance;Sit to/from stand Toileting - Clothing Manipulation Details (indicate cue type and reason): Able to partially complete with lateral leans and no physical assist, but stood for thorough pericare.      Functional mobility during ADLs: Moderate assistance;Rolling walker General ADL Comments: Pt had been sitting up in recliner since this am and was getting worn out requesting to lay back in bed. Able to complete toilet transfer with mod assist. +2 assist initially for safety (with 1 therapist providing min guard and the other mod assist) but pt able to progress to +1 assist.       Vision                     Perception     Praxis  Cognition   Behavior During Therapy: WFL for tasks assessed/performed Overall Cognitive Status: History of cognitive impairments - at baseline                  General Comments: Pt able to report "i had a stroke" pt reports diffculty expressing words. pt reports outpatient therapy of L wrist due to fall in June with break.     Extremity/Trunk Assessment                Exercises     Shoulder Instructions       General Comments      Pertinent Vitals/ Pain       Pain Assessment: No/denies pain  Home Living     Available Help at Discharge: Family;Available 24 hours/day                   Bathroom Toilet: Handicapped height Bathroom Accessibility: Yes How Accessible: Accessible via walker;Accessible via wheelchair     Additional Comments: Pt. denies home care but has been going to OPPT for following wrist surgery      Prior Functioning/Environment Level of Independence: Independent with assistive device(s)        Comments: Pt. reports she has progressively been requiring the assist of cane, rolling walker and wheelchair   Frequency  Min 2X/week        Progress Toward Goals  OT Goals(current goals can now be found in the care plan section)  Progress towards OT goals: Progressing toward goals  Acute Rehab OT Goals Patient Stated Goal: to not fall OT Goal Formulation: With patient Time For Goal Achievement: 10/05/16 Potential to Achieve Goals: Good ADL Goals Pt Will Perform Grooming: with min guard assist;sitting Pt Will Perform Upper Body Bathing: with min assist;sitting Pt Will Perform Lower Body Bathing: with mod assist;sit to/from stand Pt Will Transfer to Toilet: with min assist;bedside commode;stand pivot transfer Additional ADL Goal #1: Pt will complete bed mobility min (A) as precursor to adls.   Plan Discharge plan remains appropriate    Co-evaluation                 End of Session Equipment Utilized During Treatment: Gait belt   Activity Tolerance Patient tolerated treatment well   Patient Left in chair;with call bell/phone within reach;with chair alarm set   Nurse Communication Mobility status;Precautions        Time: [id number] OT Time Calculation (min): 25 min  Charges: OT General Charges $OT Visit: 1 Procedure OT Treatments $Self Care/Home Management : 23-37 mins  Norman Herrlich, OTR/L [phone] 09/25/2016, 3:29 PM

## 2016-09-25 NOTE — Progress Notes (Signed)
Patient ID: Savannah Travis, female   DOB: [birth year], 76 y.o.   MRN: [id number] Patient arrived with RN from [facility] and belongings. Patient and family oriented to room, fall prevention plan, rehab safety plan, health resource notebook, and rehab schedule. Patient resting in bed with bed alarm and no complaints of pain.

## 2016-09-25 NOTE — H&P (Signed)
Physical Medicine and Rehabilitation Admission H&P       Chief Complaint  Patient presents with  . Altered Mental Status  : HPI: Savannah Travis a 76 y.o.right handed femalewith history of Depression, breast cancer, migraine headaches and her chart review patient lives with spouse. Independent with a walker for short distances. One level home with ramped entrance. She has had some recent falls.. Presented 09/20/2016 with episode of unresponsiveness while riding in a car with her family and intermittent headaches. She began to respond after 10-15 minutes. Family had reported 3 such events since 09/17/2016 as well as complaints of progressive weakness and decrease in mobility. She had had a 30 day cardiac event monitor done last summer in [place] records were not yet available. 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 MRA with focal high-grade left M1 segment stenosis elsewhere negative intracranial MRA. Patient did not receive TPA. Echocardiogram with ejection fraction of 07% grade 1 diastolic dysfunction. EEG negative. A carotid Doppler 08/08/2016 was negative. Neurology consulted and maintained on aspirin and Plavix for CVA prophylaxis 3 months then Plavix alone. Findings of questionable prediabetes mellitus with hemoglobin A1c 5.6. Klebsiella UTI intravenous Rocephin transitioned to oral Ceftin. MRI cervical lumbar thoracic spine completed due to persistent weakness of lower extremities and felt to be unremarkable as evaluated by Dr. Kathyrn Sheriff and plan follow-up outpatient with Dr. Leta Baptist to consider possible EMG. Physical and occupational therapy evaluations completed 09/21/2016 with recommendations of physical medicine rehabilitation consult. Patient was admitted for a comprehensive rehabilitation program  Review of Systems  Constitutional: Negative for fever.  HENT: Positive for hearing loss. Negative for tinnitus.   Eyes: Negative for blurred vision and  double vision.  Respiratory: Negative for cough.        Occasional shortness of breath with exertion  Cardiovascular: Positive for leg swelling. Negative for chest pain and palpitations.  Gastrointestinal: Positive for constipation. Negative for heartburn and nausea.  Genitourinary: Negative for dysuria, flank pain and hematuria.       Stress incontinence  Musculoskeletal: Positive for falls, joint pain and myalgias.  Skin: Negative for rash.  Neurological: Positive for dizziness and weakness. Negative for loss of consciousness.  Psychiatric/Behavioral: Positive for depression.       Anxiety  All other systems reviewed and are negative.      Past Medical History:  Diagnosis Date  . Cancer (HCC)    breast  . Headache    migraines  . Hypertension   . S/P wrist surgery 2015, 2017   for fx        Past Surgical History:  Procedure Laterality Date  . BREAST SURGERY Right 2012   lumps removed  . OVARIAN CYST SURGERY  1962        Family History  Problem Relation Age of Onset  . Stroke Father   . CAD Father   . Cancer Sister   . Breast cancer Sister   . Colon cancer Sister   . Stroke Other   . Rheum arthritis Neg Hx    Social History:  reports that she has never smoked. She has never used smokeless tobacco. She reports that she does not drink alcohol or use drugs. Allergies:      Allergies  Allergen Reactions  . Demerol [Meperidine] Nausea And Vomiting  . Integra [Fe Fum-Fepoly-Vit C-Vit B3] Diarrhea  . Magnesium Oxide Diarrhea  . Metformin And Related Diarrhea  . Sulfonylureas Swelling and Rash  Medications Prior to Admission  Medication Sig Dispense Refill  . allopurinol (ZYLOPRIM) 300 MG tablet Take 300 mg by mouth every morning.     Marland Kitchen amLODipine (NORVASC) 5 MG tablet Take 5 mg by mouth every morning.     Marland Kitchen aspirin 81 MG chewable tablet Chew 81 mg by mouth at bedtime.     Marland Kitchen atorvastatin (LIPITOR) 40 MG tablet Take 40 mg by mouth  at bedtime.     . butalbital-acetaminophen-caffeine (FIORICET WITH CODEINE) 50-325-40-30 MG capsule Take 1 capsule by mouth every 4 (four) hours as needed for headache.    . calcium-vitamin D (CALCIUM 500/D) 500-200 MG-UNIT tablet Take 1 tablet by mouth at bedtime.     . Cholecalciferol (VITAMIN D3) 5000 units TABS Take 1 tablet by mouth at bedtime.     . clidinium-chlordiazePOXIDE (LIBRAX) 5-2.5 MG capsule Take 2 capsules by mouth daily as needed (for spasms).     . clonazePAM (KLONOPIN) 1 MG tablet Take 1 mg by mouth at bedtime as needed for anxiety.     . Coenzyme Q10 (COQ10) 200 MG CAPS Take 200 mg by mouth 2 (two) times daily.    Marland Kitchen denosumab (PROLIA) 60 MG/ML SOLN injection Inject 60 mg into the skin every 6 (six) months. Administer in upper arm, thigh, or abdomen    . escitalopram (LEXAPRO) 10 MG tablet Take 10 mg by mouth at bedtime.     . Glucos-Chond-Hyal Ac-Ca Fructo (MOVE FREE JOINT HEALTH ADVANCE PO) Take 1 tablet by mouth every morning.     . maprotiline (LUDIOMIL) 50 MG tablet Take 50 mg by mouth at bedtime.    . meclizine (ANTIVERT) 12.5 MG tablet Take 12.5 mg by mouth daily as needed for dizziness.     . montelukast (SINGULAIR) 10 MG tablet Take 10 mg by mouth daily as needed (for SOB).     . MYRBETRIQ 50 MG TB24 tablet Take 50 mg by mouth every morning.     Marland Kitchen omeprazole (PRILOSEC) 40 MG capsule Take 40 mg by mouth at bedtime.     . OXcarbazepine (TRILEPTAL) 150 MG tablet Take 150 mg by mouth every morning.    . Pancrelipase, Lip-Prot-Amyl, 5000 units CPEP Take 5,000-10,000 Units by mouth daily as needed (diarrhea).     . topiramate (TOPAMAX) 25 MG tablet Take 25 mg by mouth at bedtime.       Home: Home Living Family/patient expects to be discharged to:: Private residence Living Arrangements: Spouse/significant other Available Help at Discharge: Family Type of Home: House Home Access: [place]: One level Bathroom  Shower/Tub: Multimedia programmer: [place]geman: Environmental consultant - 2 wheels, Shower seat, Bedside commode, Radio producer - single point, Wheelchair - manual  Lives With: Spouse   Functional History: Prior Function Level of Independence: Independent  Functional Status:  Mobility: Bed Mobility Overal bed mobility: Needs Assistance Bed Mobility: Rolling, Sidelying to Sit Rolling: Min guard Sidelying to sit: Mod assist General bed mobility comments: min guard to roll to right sidelying using LUE to reach towards rail and LLE to position, increased time required to perform rolling, increased physical assist required to power up from sidelying and rotate trunk to EOB. Transfers Overall transfer level: Needs assistance Equipment used:  (2 person face to face) Transfers: Sit to/from Stand Sit to Stand: +2 physical assistance, Min assist General transfer comment: +2 min assist to power to upright position at EOB with UE supported on therapists' arms Ambulation/Gait Ambulation/Gait assistance: Mod assist, +2 physical assistance  Ambulation Distance (Feet): 8 Feet Assistive device: 2 person hand held assist (with gait belt and wrap around support) Gait Pattern/deviations: Step-to pattern, Decreased stride length, Shuffle, Decreased dorsiflexion - right, Decreased weight shift to left General Gait Details: patient with heavy list to the right, required manual assist to weight shift to the left and cues for step placement of RLE. Increased physical assist to maintain upright positioning. During short ambulation patient with muscular fatigue and increasing weakness Gait velocity: decreased Gait velocity interpretation: <1.8 ft/sec, indicative of risk for recurrent falls    ADL: ADL Overall ADL's : Needs assistance/impaired Grooming: Wash/dry hands, Wash/dry face, Sitting, Minimal assistance Upper Body Bathing: Moderate assistance Lower Body Bathing: Total assistance Toilet Transfer: +2  for physical assistance, Minimal assistance, Stand-pivot Toileting - Clothing Manipulation Details (indicate cue type and reason): in continence noted on arrival due to x2 techs helping with hyigene bed level  General ADL Comments: pt requires weight shift R to L to help facilitate stepping. pt dragging R LE. pt with decr gait length and velocity noted  Cognition: Cognition Overall Cognitive Status: History of cognitive impairments - at baseline (Daughter states that pt is at new baseline except for speech) Arousal/Alertness: Awake/alert Orientation Level: Oriented X4 Attention: Focused Focused Attention: Appears intact Memory: Appears intact Awareness: Appears intact Cognition Arousal/Alertness: Awake/alert Behavior During Therapy: WFL for tasks assessed/performed Overall Cognitive Status: History of cognitive impairments - at baseline (Daughter states that pt is at new baseline except for speech) General Comments: Pt able to report "i had a stroke" pt reports diffculty expressing words. pt reports outpatient therapy of L wrist due to fall in June with break.   Physical Exam: Blood pressure 119/63, pulse 74, temperature 98.1 F (36.7 C), temperature source Oral, resp. rate 18, height 4' 8"  (1.422 m), weight 93.7 kg (206 lb 9.1 oz), SpO2 95 %. Physical Exam  Vitals reviewed. Constitutional: She appears well-developed. No distress.  HENT:  Head: Normocephalic and atraumatic.  Right Ear: External ear normal.  Left Ear: External ear normal.  Eyes: EOM are normal. Left eye exhibits no discharge.  Neck: Normal range of motion. Neck supple. No JVD present. No tracheal deviation present. No thyromegaly present.  Cardiovascular: Normal rate, regular rhythm, normal heart sounds and intact distal pulses.   Respiratory: Effort normal and breath sounds normal. No respiratory distress. She has no wheezes. She exhibits no tenderness.  GI: Soft. Bowel sounds are normal. She exhibits no  distension. There is no tenderness.  Musculoskeletal: She exhibits no edema or deformity.  Skin: Skin is warm and dry.  Psychiatric: She has a normal mood and affect. Her behavior is normal.  Neurological:  She is alert, oriented. Follows simple commands. Has reasonable insight and awareness. RUE: 3-/5 deltoid, 3/5 biceps, triceps, wrist, hand. LUE 4- to 4/5. RLE: 2/5 HF, 3-KE, 3-/5 ADF/PF. LLE: 3 to 3+/5 KE, HF, 4/5 ADF/PF. Sensation nearly normal to LT,pain bilateral upper and lower exts.  DTR's 1+. Mild right cetnral 7, no deviation of tongue, speech generally clear.  Skin: Skin is warmand dry Psych: pleasant and cooperative  Lab Results Last 48 Hours        Results for orders placed or performed during the hospital encounter of 09/20/16 (from the past 48 hour(s))  Troponin I     Status: None   Collection Time: 09/23/16 10:19 AM  Result Value Ref Range   Troponin I <0.03 <0.03 ng/mL  Troponin I     Status: None   Collection Time:  09/23/16  3:23 PM  Result Value Ref Range   Troponin I <0.03 <0.03 ng/mL  Troponin I     Status: None   Collection Time: 09/23/16 10:38 PM  Result Value Ref Range   Troponin I <0.03 <0.03 ng/mL  Troponin I     Status: None   Collection Time: 09/24/16  4:52 AM  Result Value Ref Range   Troponin I <0.03 <0.03 ng/mL  Basic metabolic panel     Status: Abnormal   Collection Time: 09/24/16  4:52 AM  Result Value Ref Range   Sodium 140 135 - 145 mmol/L   Potassium 3.8 3.5 - 5.1 mmol/L   Chloride 110 101 - 111 mmol/L   CO2 19 (L) 22 - 32 mmol/L   Glucose, Bld 113 (H) 65 - 99 mg/dL   BUN 18 6 - 20 mg/dL   Creatinine, Ser 1.09 (H) 0.44 - 1.00 mg/dL   Calcium 8.8 (L) 8.9 - 10.3 mg/dL   GFR calc non Af Amer 48 (L) >60 mL/min   GFR calc Af Amer 56 (L) >60 mL/min    Comment: (NOTE) The eGFR has been calculated using the CKD EPI equation. This calculation has not been validated in all clinical situations. eGFR's persistently <60 mL/min  signify possible Chronic Kidney Disease.   Anion gap 11 5 - 15  Troponin I     Status: None   Collection Time: 09/24/16 10:18 AM  Result Value Ref Range   Troponin I <0.03 <0.03 ng/mL  Troponin I     Status: None   Collection Time: 09/24/16  4:21 PM  Result Value Ref Range   Troponin I <0.03 <0.03 ng/mL  Troponin I     Status: None   Collection Time: 09/24/16 11:28 PM  Result Value Ref Range   Troponin I <0.03 <0.03 ng/mL      Imaging Results (Last 48 hours)  Mr Thoracic Spine Wo Contrast  Result Date: 09/23/2016 CLINICAL DATA:  Weakness and inability to ambulate. EXAM: MRI THORACIC SPINE WITHOUT CONTRAST TECHNIQUE: Multiplanar, multisequence MR imaging of the thoracic spine was performed. No intravenous contrast was administered. COMPARISON:  Cervical and lumbar spine MRI examinations from yesterday. FINDINGS: Alignment:  Scoliosis but normal alignment in the sagittal plane. Vertebrae: Normal marrow signal except for a few small scattered hemangiomas. Mild endplate reactive changes are noted at T12-L1. Cord: Normal signal intensity. No cord lesions or syrinx. The conus medullaris is normal. Paraspinal and other soft tissues: No significant findings. Disc levels: Shallow disc protrusion at T2-3 with mild flattening of the ventral thecal sac. No other significant disc protrusions, spinal or foraminal stenosis involving the thoracic spine. Minimal disc bulges at T11-12 and T12-L1. IMPRESSION: Thoracic scoliosis. Shallow disc protrusion at T2-3 without significant neural compression. No other disc protrusions, spinal or foraminal stenosis in the thoracic spine. Normal MR appearance of the thoracic spinal cord. Electronically Signed   By: Marijo Sanes M.D.   On: 09/23/2016 18:44        Medical Problem List and Plan: 1. Decreased functional mobility with lower extremity weakness/altered mental status/question syncope  secondary to left corona radiata infarct             -admit  to inpatient rehab 2.  DVT Prophylaxis/Anticoagulation: SCDs. Monitor for any signs of DVT 3. Pain Management/headaches: Topamax 25 mg twice a day 4. Mood: Ludiomil 50 mg daily at bedtime. Klonopin 1 mg daily at bedtime---pt would like this scheduled 5. Neuropsych: This patient is capable of  making decisions on her own behalf. 6. Skin/Wound Care: Routine skin checks 7. Fluids/Electrolytes/Nutrition: Routine I&O with follow-up chemistries upon admit. Encourage PO 8. 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 Continue Trileptal 300 mg twice a day  -recent EEG negative 9. Hypertension. Norvasc 5 mg daily. Monitor with increased mobility 10. Klebsiella UTI. Ceftin initiated 09/24/2016 transitioning from Rocephin. 11. Hyperlipidemia. Lipitor 12. History of gout. Allopurinol 300 mg daily. Monitor for any gout flareups. No pain at present  Post Admission Physician Evaluation: 1. Functional deficits secondary  to left corona radiata infarct. 2. Patient is admitted to receive collaborative, interdisciplinary care between the physiatrist, rehab nursing staff, and therapy team. 3. Patient's level of medical complexity and substantial therapy needs in context of that medical necessity cannot be provided at a lesser intensity of care such as a SNF. 4. Patient has experienced substantial functional loss from his/her baseline which was documented above under the "Functional History" and "Functional Status" headings.  Judging by the patient's diagnosis, physical exam, and functional history, the patient has potential for functional progress which will result in measurable gains while on inpatient rehab.  These gains will be of substantial and practical use upon discharge  in facilitating mobility and self-care at the household level. 5. Physiatrist will provide 24 hour management of medical needs as well as oversight of the therapy plan/treatment and provide guidance as appropriate regarding the interaction of  the two. 6. The Preadmission Screening has been reviewed and patient status is unchanged unless otherwise stated above. 7. 24 hour rehab nursing will assist with bladder management, bowel management, safety, skin/wound care, disease management, medication administration, pain management and patient education  and help integrate therapy concepts, techniques,education, etc. 8. PT will assess and treat for/with: Lower extremity strength, range of motion, stamina, balance, functional mobility, safety, adaptive techniques and equipment, NMR, pain control, family education.   Goals are: mod I to supervision. 9. OT will assess and treat for/with: ADL's, functional mobility, safety, upper extremity strength, adaptive techniques and equipment, NMR, community reintegration, family ed.   Goals are: mod I to supervision. Therapy may proceed with showering this patient. 10. SLP will assess and treat for/with: cognition, communication.  Goals are: mod I. 11. Case Management and Social Worker will assess and treat for psychological issues and discharge planning. 12. Team conference will be held weekly to assess progress toward goals and to determine barriers to discharge. 13. Patient will receive at least 3 hours of therapy per day at least 5 days per week. 14. ELOS: 7-10 days       15. Prognosis:  excellent     Meredith Staggers, MD, [place] [facility] 09/25/2016  Cathlyn Parsons., PA-C 09/25/2016

## 2016-09-25 NOTE — Progress Notes (Signed)
Pt being transferred to inpatient rehab per orders from MD. Pt and family made aware of transfer. Pt's IV was removed prior to discharge. RN gave report to [employer] on 4W. Pt being transferred to 4W07.

## 2016-09-26 ENCOUNTER — Inpatient Hospital Stay (HOSPITAL_COMMUNITY): Payer: Medicare Other

## 2016-09-26 ENCOUNTER — Inpatient Hospital Stay (HOSPITAL_COMMUNITY): Payer: Medicare Other | Admitting: Physical Therapy

## 2016-09-26 ENCOUNTER — Inpatient Hospital Stay (HOSPITAL_COMMUNITY): Payer: Medicare Other | Admitting: Occupational Therapy

## 2016-09-26 ENCOUNTER — Inpatient Hospital Stay (HOSPITAL_COMMUNITY): Payer: Medicare Other | Admitting: Speech Pathology

## 2016-09-26 DIAGNOSIS — I69322 Dysarthria following cerebral infarction: Secondary | ICD-10-CM

## 2016-09-26 DIAGNOSIS — G8191 Hemiplegia, unspecified affecting right dominant side: Secondary | ICD-10-CM

## 2016-09-26 DIAGNOSIS — I69320 Aphasia following cerebral infarction: Secondary | ICD-10-CM

## 2016-09-26 LAB — COMPREHENSIVE METABOLIC PANEL
ALK PHOS: 90 U/L (ref 38–126)
ALT: 20 U/L (ref 14–54)
AST: 32 U/L (ref 15–41)
Albumin: 3.9 g/dL (ref 3.5–5.0)
Anion gap: 9 (ref 5–15)
BUN: 26 mg/dL — ABNORMAL HIGH (ref 6–20)
CALCIUM: 9 mg/dL (ref 8.9–10.3)
CO2: 23 mmol/L (ref 22–32)
CREATININE: 1.22 mg/dL — AB (ref 0.44–1.00)
Chloride: 108 mmol/L (ref 101–111)
GFR, EST AFRICAN AMERICAN: 49 mL/min — AB (ref >60)
GFR, EST NON AFRICAN AMERICAN: 42 mL/min — AB (ref >60)
Glucose, Bld: 117 mg/dL — ABNORMAL HIGH (ref 65–99)
Potassium: 4 mmol/L (ref 3.5–5.1)
Sodium: 140 mmol/L (ref 135–145)
Total Bilirubin: 0.9 mg/dL (ref 0.3–1.2)
Total Protein: 7 g/dL (ref 6.5–8.1)

## 2016-09-26 LAB — CBC WITH DIFFERENTIAL/PLATELET
Basophils Absolute: 0 10*3/uL (ref 0.0–0.1)
Basophils Relative: 0 %
Eosinophils Absolute: 0.3 10*3/uL (ref 0.0–0.7)
Eosinophils Relative: 4 %
HCT: 42 % (ref 36.0–46.0)
HEMOGLOBIN: 13.2 g/dL (ref 12.0–15.0)
LYMPHS ABS: 1.7 10*3/uL (ref 0.7–4.0)
LYMPHS PCT: 28 %
MCH: 28.7 pg (ref 26.0–34.0)
MCHC: 31.4 g/dL (ref 30.0–36.0)
MCV: 91.3 fL (ref 78.0–100.0)
Monocytes Absolute: 0.4 10*3/uL (ref 0.1–1.0)
Monocytes Relative: 6 %
Neutro Abs: 3.9 10*3/uL (ref 1.7–7.7)
Neutrophils Relative %: 62 %
Platelets: 317 10*3/uL (ref 150–400)
RBC: 4.6 MIL/uL (ref 3.87–5.11)
RDW: 14.6 % (ref 11.5–15.5)
WBC: 6.2 10*3/uL (ref 4.0–10.5)

## 2016-09-26 NOTE — Progress Notes (Signed)
Meredith Staggers, MD Physician Signed Physical Medicine and Rehabilitation  Consult Note Date of Service: 09/21/2016 3:04 PM  Related encounter: ED to Hosp-Admission (Discharged) from 09/20/2016 in [place] All Collapse All   [] Hide copied text [] Hover for attribution information      Physical Medicine and Rehabilitation Consult Reason for Consult: Acute infarct central left corona radiata Referring Physician: [facility]   HPI: Savannah Travis is a 76 y.o. right handed female with history of breast cancer, migraine headaches and her chart review patient lives with spouse. Independent with a walker for short distances. One level home with ramped entrance. She has had some recent falls.. Presented 09/20/2016 with generalized weakness, altered mental status and intermittent headaches. 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 MRA with focal high-grade left M1 segment stenosis elsewhere negative intracranial MRA. Patient did not receive TPA. Echocardiogram pending. Neurology consulted with workup presently ongoing. Physical therapy evaluation completed 09/21/2016 with recommendations of physical medicine rehabilitation consult.   Review of Systems  Constitutional: Negative for chills and fever.  HENT: Positive for hearing loss. Negative for tinnitus.   Eyes: Negative for blurred vision and double vision.  Respiratory: Negative for cough and shortness of breath.   Cardiovascular: Negative for chest pain, palpitations and leg swelling.  Gastrointestinal: Positive for constipation. Negative for nausea and vomiting.  Genitourinary: Negative for dysuria and hematuria.       Urinary incontinence  Musculoskeletal: Positive for falls.  Skin: Negative for rash.  Neurological: Positive for dizziness, speech change, weakness and headaches.  Psychiatric/Behavioral: Positive for depression.       Anxiety  All other systems  reviewed and are negative.      Past Medical History:  Diagnosis Date  . Cancer (HCC)    breast  . Headache    migraines  . Hypertension   . S/P wrist surgery 2015, 2017   for fx        Past Surgical History:  Procedure Laterality Date  . BREAST SURGERY Right 2012   lumps removed  . OVARIAN CYST SURGERY  1962        Family History  Problem Relation Age of Onset  . Stroke Father   . CAD Father   . Cancer Sister   . Breast cancer Sister   . Colon cancer Sister   . Stroke Other   . Rheum arthritis Neg Hx    Social History:  reports that she has never smoked. She has never used smokeless tobacco. She reports that she does not drink alcohol or use drugs. Allergies:      Allergies  Allergen Reactions  . Demerol [Meperidine] Nausea And Vomiting  . Integra [Fe Fum-Fepoly-Vit C-Vit B3] Diarrhea  . Magnesium Oxide Diarrhea  . Metformin And Related Diarrhea  . Sulfonylureas Swelling and Rash         Medications Prior to Admission  Medication Sig Dispense Refill  . allopurinol (ZYLOPRIM) 300 MG tablet Take 300 mg by mouth every morning.     Marland Kitchen amLODipine (NORVASC) 5 MG tablet Take 5 mg by mouth every morning.     Marland Kitchen aspirin 81 MG chewable tablet Chew 81 mg by mouth at bedtime.     Marland Kitchen atorvastatin (LIPITOR) 40 MG tablet Take 40 mg by mouth at bedtime.     . butalbital-acetaminophen-caffeine (FIORICET WITH CODEINE) 50-325-40-30 MG capsule Take 1 capsule by mouth every 4 (four) hours as needed for headache.    Marland Kitchen  calcium-vitamin D (CALCIUM 500/D) 500-200 MG-UNIT tablet Take 1 tablet by mouth at bedtime.     . Cholecalciferol (VITAMIN D3) 5000 units TABS Take 1 tablet by mouth at bedtime.     . clidinium-chlordiazePOXIDE (LIBRAX) 5-2.5 MG capsule Take 2 capsules by mouth daily as needed (for spasms).     . clonazePAM (KLONOPIN) 1 MG tablet Take 1 mg by mouth at bedtime as needed for anxiety.     . Coenzyme Q10 (COQ10) 200 MG CAPS Take 200 mg by  mouth 2 (two) times daily.    Marland Kitchen denosumab (PROLIA) 60 MG/ML SOLN injection Inject 60 mg into the skin every 6 (six) months. Administer in upper arm, thigh, or abdomen    . escitalopram (LEXAPRO) 10 MG tablet Take 10 mg by mouth at bedtime.     . Glucos-Chond-Hyal Ac-Ca Fructo (MOVE FREE JOINT HEALTH ADVANCE PO) Take 1 tablet by mouth every morning.     . maprotiline (LUDIOMIL) 50 MG tablet Take 50 mg by mouth at bedtime.    . meclizine (ANTIVERT) 12.5 MG tablet Take 12.5 mg by mouth daily as needed for dizziness.     . montelukast (SINGULAIR) 10 MG tablet Take 10 mg by mouth daily as needed (for SOB).     . MYRBETRIQ 50 MG TB24 tablet Take 50 mg by mouth every morning.     Marland Kitchen omeprazole (PRILOSEC) 40 MG capsule Take 40 mg by mouth at bedtime.     . OXcarbazepine (TRILEPTAL) 150 MG tablet Take 150 mg by mouth every morning.    . Pancrelipase, Lip-Prot-Amyl, 5000 units CPEP Take 5,000-10,000 Units by mouth daily as needed (diarrhea).     . topiramate (TOPAMAX) 25 MG tablet Take 25 mg by mouth at bedtime.       Home: Home Living Family/patient expects to be discharged to:: Private residence Living Arrangements: Spouse/significant other Available Help at Discharge: Family Type of Home: House Home Access: [place]: One level Bathroom Shower/Tub: Multimedia programmer: [place]: Environmental consultant - 2 wheels, Shower seat, Bedside commode, Radio producer - single point, Wheelchair - manual  Lives With: Spouse  Functional History: Prior Function Level of Independence: Independent Functional Status:  Mobility: Bed Mobility Overal bed mobility: Needs Assistance Bed Mobility: Rolling, Sidelying to Sit Rolling: Min guard Sidelying to sit: Mod assist General bed mobility comments: min guard to roll to right sidelying using LUE to reach towards rail and LLE to position, increased time required to perform rolling, increased physical assist required  to power up from sidelying and rotate trunk to EOB. Transfers Overall transfer level: Needs assistance Equipment used:  (2 person face to face) Transfers: Sit to/from Stand Sit to Stand: +2 physical assistance, Min assist General transfer comment: +2 min assist to power to upright position at EOB with UE supported on therapists' arms Ambulation/Gait Ambulation/Gait assistance: Mod assist, +2 physical assistance Ambulation Distance (Feet): 8 Feet Assistive device: 2 person hand held assist (with gait belt and wrap around support) Gait Pattern/deviations: Step-to pattern, Decreased stride length, Shuffle, Decreased dorsiflexion - right, Decreased weight shift to left General Gait Details: patient with heavy list to the right, required manual assist to weight shift to the left and cues for step placement of RLE. Increased physical assist to maintain upright positioning. During short ambulation patient with muscular fatigue and increasing weakness Gait velocity: decreased Gait velocity interpretation: <1.8 ft/sec, indicative of risk for recurrent falls    ADL: ADL Overall ADL's : Needs assistance/impaired Grooming: Wash/dry  hands, Wash/dry face, Sitting, Minimal assistance Upper Body Bathing: Moderate assistance Lower Body Bathing: Total assistance Toilet Transfer: +2 for physical assistance, Minimal assistance, Stand-pivot Toileting - Clothing Manipulation Details (indicate cue type and reason): in continence noted on arrival due to x2 techs helping with hyigene bed level  General ADL Comments: pt requires weight shift R to L to help facilitate stepping. pt dragging R LE. pt with decr gait length and velocity noted  Cognition: Cognition Overall Cognitive Status: History of cognitive impairments - at baseline (Daughter states that pt is at new baseline except for speech) Arousal/Alertness: Awake/alert Orientation Level: Oriented X4 Attention: Focused Focused Attention: Appears  intact Memory: Appears intact Awareness: Appears intact Cognition Arousal/Alertness: Awake/alert Behavior During Therapy: WFL for tasks assessed/performed Overall Cognitive Status: History of cognitive impairments - at baseline (Daughter states that pt is at new baseline except for speech) General Comments: Pt able to report "i had a stroke" pt reports diffculty expressing words. pt reports outpatient therapy of L wrist due to fall in June with break.   Blood pressure (!) 164/83, pulse 84, temperature 98.6 F (37 C), temperature source Oral, resp. rate 18, height 4\' 8"  (1.422 m), weight 87.9 kg (193 lb 12.6 oz), SpO2 96 %. Physical Exam  Vitals reviewed. HENT:  Head: Normocephalic.  Eyes: EOM are normal.  Neck: Normal range of motion. Neck supple. No tracheal deviation present. No thyromegaly present.  Cardiovascular: Normal rate and regular rhythm.   Respiratory: Effort normal and breath sounds normal. No respiratory distress.  GI: Soft. Bowel sounds are normal. She exhibits no distension.  Neurological:  She is alert, oriented. Follows simplec commands. No word finding deficits. RUE: 2/5 prox to distal. LUE 3-4/5. RLE:  1-2/5. LLE: 3- to 3/5. Decreased sensation RUE and RLE and to a lesser extent on the left. DTR's 1+. Mild right cetnral 7  Skin: Skin is warm and dry.    Lab Results Last 24 Hours       Results for orders placed or performed during the hospital encounter of 09/20/16 (from the past 24 hour(s))  CBG monitoring, ED     Status: Abnormal   Collection Time: 09/20/16  4:33 PM  Result Value Ref Range   Glucose-Capillary 181 (H) 65 - 99 mg/dL  Comprehensive metabolic panel     Status: Abnormal   Collection Time: 09/20/16  4:51 PM  Result Value Ref Range   Sodium 140 135 - 145 mmol/L   Potassium 3.2 (L) 3.5 - 5.1 mmol/L   Chloride 108 101 - 111 mmol/L   CO2 20 (L) 22 - 32 mmol/L   Glucose, Bld 174 (H) 65 - 99 mg/dL   BUN 11 6 - 20 mg/dL   Creatinine, Ser  1.00 0.44 - 1.00 mg/dL   Calcium 9.3 8.9 - 10.3 mg/dL   Total Protein 6.9 6.5 - 8.1 g/dL   Albumin 3.8 3.5 - 5.0 g/dL   AST 21 15 - 41 U/L   ALT 14 14 - 54 U/L   Alkaline Phosphatase 96 38 - 126 U/L   Total Bilirubin 0.3 0.3 - 1.2 mg/dL   GFR calc non Af Amer 54 (L) >60 mL/min   GFR calc Af Amer >60 >60 mL/min   Anion gap 12 5 - 15  CBC with Differential     Status: None   Collection Time: 09/20/16  4:51 PM  Result Value Ref Range   WBC 8.2 4.0 - 10.5 K/uL   RBC 4.50 3.87 - 5.11 MIL/uL  Hemoglobin 12.8 12.0 - 15.0 g/dL   HCT 40.3 36.0 - 46.0 %   MCV 89.6 78.0 - 100.0 fL   MCH 28.4 26.0 - 34.0 pg   MCHC 31.8 30.0 - 36.0 g/dL   RDW 14.2 11.5 - 15.5 %   Platelets 292 150 - 400 K/uL   Neutrophils Relative % 68 %   Neutro Abs 5.6 1.7 - 7.7 K/uL   Lymphocytes Relative 25 %   Lymphs Abs 2.0 0.7 - 4.0 K/uL   Monocytes Relative 4 %   Monocytes Absolute 0.3 0.1 - 1.0 K/uL   Eosinophils Relative 2 %   Eosinophils Absolute 0.2 0.0 - 0.7 K/uL   Basophils Relative 1 %   Basophils Absolute 0.1 0.0 - 0.1 K/uL  I-Stat CG4 Lactic Acid, ED     Status: Abnormal   Collection Time: 09/20/16  4:57 PM  Result Value Ref Range   Lactic Acid, Venous 2.37 (HH) 0.5 - 1.9 mmol/L   Comment NOTIFIED PHYSICIAN   Urinalysis, Routine w reflex microscopic     Status: Abnormal   Collection Time: 09/20/16  8:21 PM  Result Value Ref Range   Color, Urine STRAW (A) YELLOW   APPearance CLEAR CLEAR   Specific Gravity, Urine 1.004 (L) 1.005 - 1.030   pH 8.0 5.0 - 8.0   Glucose, UA NEGATIVE NEGATIVE mg/dL   Hgb urine dipstick NEGATIVE NEGATIVE   Bilirubin Urine NEGATIVE NEGATIVE   Ketones, ur NEGATIVE NEGATIVE mg/dL   Protein, ur NEGATIVE NEGATIVE mg/dL   Nitrite POSITIVE (A) NEGATIVE   Leukocytes, UA MODERATE (A) NEGATIVE   RBC / HPF 0-5 0 - 5 RBC/hpf   WBC, UA 6-30 0 - 5 WBC/hpf   Bacteria, UA RARE (A) NONE SEEN   Squamous Epithelial / LPF 0-5 (A) NONE  SEEN  Sedimentation rate     Status: None   Collection Time: 09/20/16  8:52 PM  Result Value Ref Range   Sed Rate 22 0 - 22 mm/hr  Ethanol     Status: None   Collection Time: 09/20/16  8:58 PM  Result Value Ref Range   Alcohol, Ethyl (B) <5 <5 mg/dL  Salicylate level     Status: None   Collection Time: 09/20/16  8:58 PM  Result Value Ref Range   Salicylate Lvl [id number] 2.8 - 30.0 mg/dL  Acetaminophen level     Status: Abnormal   Collection Time: 09/20/16  8:58 PM  Result Value Ref Range   Acetaminophen (Tylenol), Serum <10 (L) 10 - 30 ug/mL  Lactic acid, plasma     Status: None   Collection Time: 09/20/16 10:12 PM  Result Value Ref Range   Lactic Acid, Venous 0.8 0.5 - 1.9 mmol/L  CK     Status: None   Collection Time: 09/20/16 10:12 PM  Result Value Ref Range   Total CK 69 38 - 234 U/L  Ammonia     Status: None   Collection Time: 09/20/16 10:12 PM  Result Value Ref Range   Ammonia 32 9 - 35 umol/L  C-reactive protein     Status: None   Collection Time: 09/20/16 10:12 PM  Result Value Ref Range   CRP 0.9 <1.0 mg/dL  Magnesium     Status: None   Collection Time: 09/20/16 10:12 PM  Result Value Ref Range   Magnesium 1.8 1.7 - 2.4 mg/dL  Phosphorus     Status: None   Collection Time: 09/20/16 10:12 PM  Result Value Ref Range  Phosphorus 2.9 2.5 - 4.6 mg/dL  Troponin I     Status: None   Collection Time: 09/20/16 10:12 PM  Result Value Ref Range   Troponin I <0.03 <0.03 ng/mL  Troponin I     Status: None   Collection Time: 09/21/16  5:31 AM  Result Value Ref Range   Troponin I <0.03 <0.03 ng/mL  Lipid panel     Status: None   Collection Time: 09/21/16  5:31 AM  Result Value Ref Range   Cholesterol 155 0 - 200 mg/dL   Triglycerides 148 <150 mg/dL   HDL 47 >40 mg/dL   Total CHOL/HDL Ratio 3.3 RATIO   VLDL 30 0 - 40 mg/dL   LDL Cholesterol 78 0 - 99 mg/dL  Troponin I     Status: None   Collection Time: 09/21/16 10:11 AM  Result Value  Ref Range   Troponin I <0.03 <0.03 ng/mL      Imaging Results (Last 48 hours)  Dg Chest 2 View  Result Date: 09/20/2016 CLINICAL DATA:  Transient ischemic attack. EXAM: CHEST  2 VIEW COMPARISON:  09/11/2016 FINDINGS: The heart size and mediastinal contours are within normal limits. Both lungs are clear. Minimal atelectasis at the left lung base. The visualized skeletal structures are unremarkable. IMPRESSION: No active cardiopulmonary disease. Electronically Signed   By: Ashley Royalty M.D.   On: 09/20/2016 23:24   Mr Jeri Cos [id number] Contrast  Result Date: 09/21/2016 CLINICAL DATA:  TIA. Episode of unresponsiveness with subsequent headache. EXAM: MRI HEAD WITHOUT AND WITH CONTRAST MRA HEAD WITHOUT CONTRAST TECHNIQUE: Multiplanar, multiecho pulse sequences of the brain and surrounding structures were obtained without and with intravenous contrast. Angiographic images of the head were obtained using MRA technique without contrast. CONTRAST:  37mL MULTIHANCE GADOBENATE DIMEGLUMINE 529 MG/ML IV SOLN COMPARISON:  08/08/2016 FINDINGS: MRI HEAD FINDINGS Brain: Moderate area of restricted diffusion in the central left corona radiata, measuring up to 2 cm. On coronal acquisition there is subtle asymmetric DWI hyperintensity in the left cortical spinal tracts attributed to acute [place] changes. No infarct elsewhere. No acute hemorrhage. There is chronic advanced microvascular ischemic gliosis in the cerebral white matter, deep gray nuclei, and pons. Remote hemorrhagic infarct affecting the right caudate head and anterior limb internal capsule. Brain volume is normal. No hydrocephalus or mass. No focal cortical finding to suggest a seizure focus. No chronic lobar hemorrhagic foci. Symmetric hippocampal formations when accounting for developmental cystic changes. No abnormal intracranial enhancement. Vascular: Arterial findings below. Normal dural venous sinus flow voids. Skull and upper cervical spine: Negative  Sinuses/Orbits: Bilateral cataract resection.  No acute finding MRA HEAD FINDINGS Symmetric and smooth carotid arteries. Intact circle-of-Willis. Focal moderate to advanced left M1 segment stenosis after the anterior temporal branch. Elsewhere the vessels are smooth and widely patent. No generalized beading or aneurysm. IMPRESSION: 1. Acute infarct in the central left corona radiata, nonhemorrhagic. 2. Focal high-grade left M1 segment stenosis. Elsewhere negative intracranial MRA. 3. 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 Electronically Signed   By: Monte Fantasia M.D.   On: 09/21/2016 12:02   Mr Jodene Nam Head/brain [id number] Cm  Result Date: 09/21/2016 CLINICAL DATA:  TIA. Episode of unresponsiveness with subsequent headache. EXAM: MRI HEAD WITHOUT AND WITH CONTRAST MRA HEAD WITHOUT CONTRAST TECHNIQUE: Multiplanar, multiecho pulse sequences of the brain and surrounding structures were obtained without and with intravenous contrast. Angiographic images of the head were obtained using MRA technique without contrast. CONTRAST:  69mL MULTIHANCE GADOBENATE  DIMEGLUMINE 529 MG/ML IV SOLN COMPARISON:  08/08/2016 FINDINGS: MRI HEAD FINDINGS Brain: Moderate area of restricted diffusion in the central left corona radiata, measuring up to 2 cm. On coronal acquisition there is subtle asymmetric DWI hyperintensity in the left cortical spinal tracts attributed to acute [place] changes. No infarct elsewhere. No acute hemorrhage. There is chronic advanced microvascular ischemic gliosis in the cerebral white matter, deep gray nuclei, and pons. Remote hemorrhagic infarct affecting the right caudate head and anterior limb internal capsule. Brain volume is normal. No hydrocephalus or mass. No focal cortical finding to suggest a seizure focus. No chronic lobar hemorrhagic foci. Symmetric hippocampal formations when accounting for developmental cystic changes. No abnormal intracranial  enhancement. Vascular: Arterial findings below. Normal dural venous sinus flow voids. Skull and upper cervical spine: Negative Sinuses/Orbits: Bilateral cataract resection.  No acute finding MRA HEAD FINDINGS Symmetric and smooth carotid arteries. Intact circle-of-Willis. Focal moderate to advanced left M1 segment stenosis after the anterior temporal branch. Elsewhere the vessels are smooth and widely patent. No generalized beading or aneurysm. IMPRESSION: 1. Acute infarct in the central left corona radiata, nonhemorrhagic. 2. Focal high-grade left M1 segment stenosis. Elsewhere negative intracranial MRA. 3. 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 Electronically Signed   By: Monte Fantasia M.D.   On: 09/21/2016 12:02     Assessment/Plan: Diagnosis: left corona radiata infarct 1. Does the need for close, 24 hr/day medical supervision in concert with the patient's rehab needs make it unreasonable for this patient to be served in a less intensive setting? Yes 2. Co-Morbidities requiring supervision/potential complications: hx of falls, bilateral wrist fx's 3. Due to bladder management, bowel management, safety, skin/wound care, disease management, medication administration, pain management and patient education, does the patient require 24 hr/day rehab nursing? Yes 4. Does the patient require coordinated care of a physician, rehab nurse, PT (1-2 hrs/day, 5 days/week), OT (1-2 hrs/day, 5 days/week) and SLP (1-2 hrs/day, 5 days/week) to address physical and functional deficits in the context of the above medical diagnosis(es)? Yes Addressing deficits in the following areas: balance, endurance, locomotion, strength, transferring, bowel/bladder control, bathing, dressing, feeding, grooming, toileting, cognition, speech and psychosocial support 5. Can the patient actively participate in an intensive therapy program of at least 3 hrs of therapy per day at least 5 days  per week? Yes 6. The potential for patient to make measurable gains while on inpatient rehab is excellent 7. Anticipated functional outcomes upon discharge from inpatient rehab are supervision  with PT, supervision with OT, modified independent and supervision with SLP. 8. Estimated rehab length of stay to reach the above functional goals is: 13-18 days 9. Does the patient have adequate social supports and living environment to accommodate these discharge functional goals? Yes 10. Anticipated D/C setting: Home 11. Anticipated post D/C treatments: HH therapy and Outpatient therapy 12. Overall Rehab/Functional Prognosis: excellent  RECOMMENDATIONS: This patient's condition is appropriate for continued rehabilitative care in the following setting: CIR Patient has agreed to participate in recommended program. Yes Note that insurance prior authorization may be required for reimbursement for recommended care.  Comment: Rehab Admissions Coordinator to follow up.  Thanks,  Meredith Staggers, MD, Mellody Drown    Cathlyn Parsons., PA-C 09/21/2016    Revision History                        Routing History

## 2016-09-26 NOTE — Progress Notes (Signed)
Social Work Elease Hashimoto, LCSW Social Worker Signed   Patient Care Conference Date of Service: 09/26/2016  1:22 PM      Hide copied text Hover for attribution information Inpatient RehabilitationTeam Conference and Plan of Care Update Date: 09/26/2016   Time: 10:45 AM      Patient Name: Savannah Travis      Medical Record Number: [id number]  Date of Birth: 1941/08/29 Sex: Female         Room/Bed: 4W07C/4W07C-01 Payor Info: Payor: MEDICARE / Plan: MEDICARE PART A AND B / Product Type: *No Product type* /     Admitting Diagnosis: CVA  Admit Date/Time:  09/25/2016  5:27 PM Admission Comments: No comment available    Primary Diagnosis:  <principal problem not specified> Principal Problem: <principal problem not specified>       Patient Active Problem List    Diagnosis Date Noted  . Small vessel disease, cerebrovascular 09/25/2016  . Transient alteration of awareness    . Gait disorder    . UTI due to Klebsiella species 09/23/2016  . Pre-diabetes 09/23/2016  . Spinal stenosis of lumbar region without neurogenic claudication    . Cerebrovascular accident (CVA) due to thrombosis of left middle cerebral artery ([place]) 09/22/2016  . Dysarthria 09/21/2016  . Unresponsiveness 09/20/2016  . Hypokalemia 09/20/2016  . Debility 09/20/2016  . Dehydration 09/20/2016  . Prolonged QT interval 09/20/2016  . Hypertension 09/20/2016  . Acute encephalopathy 09/20/2016      Expected Discharge Date:     Team Members Present: Physician leading conference: Dr. Alysia Penna Social Worker Present: Ovidio Kin, LCSW Nurse Present: Heather Roberts, RN PT Present: Georjean Mode, Rory Percy, PT OT Present: Clyda Greener, OT SLP Present: Windell Moulding, SLP PPS Coordinator present : Daiva Nakayama, RN, CRRN       Current Status/Progress Goal Weekly Team Focus  Medical   Patient initially with weakness of all 4 limbs. Now right-sided weakness. Dysarthria and aphasia are noted.  Improvement indication,  maintain medical stability during rehabilitation stay  Initiate rehabilitation program   Bowel/Bladder     bladder urgency   urgency with urine-MD pursuing this     Swallow/Nutrition/ Hydration             ADL's     eval pending        Mobility     eval pending        Communication     Speech evaluating        Safety/Cognition/ Behavioral Observations           Pain        pain managed-monitor     Skin        na         *See Care Plan and progress notes for long and short-term goals.   Barriers to Discharge:  requires physical assistance for all self-care and mobility   Possible Resolutions to Barriers:  Continue rehabilitation program   Discharge Planning/Teaching Needs:    Home with husband who can not assist with care, can provide supervision level. Daughter involved and assists twice weekly.     Team Discussion:  Was unable to conference due to not whole therapy team has seen and evaluated. Possible goals supervision level and est length of stay 2 weeks.  Revisions to Treatment Plan:  New eval    Continued Need for Acute Rehabilitation Level of Care: The patient requires daily medical management by a physician with specialized training in physical medicine and  rehabilitation for the following conditions: Daily direction of a multidisciplinary physical rehabilitation program to ensure safe treatment while eliciting the highest outcome that is of practical value to the patient.: Yes Daily medical management of patient stability for increased activity during participation in an intensive rehabilitation regime.: Yes Daily analysis of laboratory values and/or radiology reports with any subsequent need for medication adjustment of medical intervention for : Neurological problems   Elease Hashimoto 09/26/2016, 1:22 PM       Patient ID: [name], female   DOB: [birth year], 76 y.o.   MRN: [id number]

## 2016-09-26 NOTE — Progress Notes (Signed)
[facility] Admission Coordinator Signed Physical Medicine and Rehabilitation  PMR Pre-admission Date of Service: 09/25/2016 1:43 PM  Related encounter: ED to Hosp-Admission (Discharged) from 09/20/2016 in [place]       [] Hide copied text PMR Admission Coordinator Pre-Admission Assessment  Patient: Savannah Travis is an 76 y.o., female MRN: [id number] DOB: [birth year] Height: 4' 8"  (142.2 cm) Weight: 93.7 kg (206 lb 9.1 oz)                                                                                                                                                  Insurance Information HMO:     PPO:      PCP:      IPA:      80/20:      OTHER:  PRIMARY: Medicare A and B      Policy#:  [id number]      Subscriber:  self CM Name:        Phone#:      Fax#:  Pre-Cert#:       Employer: retired Benefits:  Phone #:       Name:   Eff. Date:  08/24/06     Deduct:  $1340      Out of Pocket Max:  n/a      Life Max:  n/a CIR:  100% after deductible met      SNF:  100% first 20 days Outpatient:  80%/20%     Co-Pay:  Home Health:  100%      Co-Pay:   DME:  80%/20%     Co-Pay:  Providers:  Pt. choice SECONDARY:  [employer]      Policy#:  [id number]      Subscriber:  self CM Name:       Phone#:      Fax#:  Pre-Cert#:       Employer:  Benefits:  Phone #:      Name:  Eff. Date:      Deduct:       Out of Pocket Max:       Life Max:  CIR:       SNF:  Outpatient:      Co-Pay:  Home Health:       Co-Pay:  DME:      Co-Pay:   Medicaid Application Date:       Case Manager:  Disability Application Date:       Case Worker:   Emergency Contact Information        Contact Information    Name Relation Home Work [place] [phone]  [phone]   Toler,Cynthia Daughter   [phone]   [name], [name]    [phone]     Current Medical History  Patient Admitting Diagnosis: left corona radiata infarct History of Present  Illness: Savannah Travis a 76 y.o.right handed femalewith history of Depression,breast cancer, migraine headaches and her chart review patient lives with spouse. Independent with a walker for short distances. One level home with ramped entrance. She has had some recent falls.. Presented 09/20/2016 with episode of unresponsiveness while riding in a car with her family and intermittent headaches.She began to respond after 10-15 minutes. Family had reported 3 such events since 09/17/2016 as well as complaints of progressive weakness and decrease in mobility. 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 MRA with focal high-grade left M1 segment stenosis elsewhere negative intracranial MRA. Patient did not receive TPA. Echocardiogram with ejection fraction of 38% grade 1 diastolic dysfunction.EEG negative. A carotid Doppler 08/08/2016 was negative.Neurology consulted and maintained on aspirin and Plavix for CVA prophylaxis 3 months then Plavix alone. Findings of questionable prediabetes mellitus with hemoglobin A1c 5.6. Klebsiella UTI intravenous Rocephin transitioned to oral Ceftin. MRI cervical lumbar thoracic spine completed due to persistent weakness of lower extremities and felt to be unremarkable as evaluated by Dr. Kathyrn Sheriff and plan follow-up outpatient with Dr. Rowe Clack consider possible EMG.Physicaland occupationaltherapy evaluationscompleted 09/21/2016 with recommendations of physical medicine rehabilitation consult. Patient was admitted for a comprehensive rehabilitation program  Total: 2 NIH    Past Medical History      Past Medical History:  Diagnosis Date  . Cancer (HCC)    breast  . Headache    migraines  . Hypertension   . S/P wrist surgery 2015, 2017   for fx    Family History  family history includes Breast cancer in her sister; CAD  in her father; Cancer in her sister; Colon cancer in her sister; Stroke in her father and other.  Prior Rehab/Hospitalizations:  Has the patient had major surgery during 100 days prior to admission? No, though pt. Did have wrist surgery in July 2017  Current Medications   Current Facility-Administered Medications:  .   stroke: mapping our early stages of recovery book, , Does not apply, Once, Toy Baker, MD, Stopped at 09/20/16 2238 .  acetaminophen (TYLENOL) tablet 650 mg, 650 mg, Oral, Q4H PRN, 650 mg at 09/24/16 1159 **OR** acetaminophen (TYLENOL) solution 650 mg, 650 mg, Per Tube, Q4H PRN **OR** acetaminophen (TYLENOL) suppository 650 mg, 650 mg, Rectal, Q4H PRN, Toy Baker, MD .  allopurinol (ZYLOPRIM) tablet 300 mg, 300 mg, Oral, Daily, Toy Baker, MD, 300 mg at 09/25/16 1052 .  amLODipine (NORVASC) tablet 5 mg, 5 mg, Oral, Daily, Toy Baker, MD, 5 mg at 09/25/16 1052 .  aspirin EC tablet 325 mg, 325 mg, Oral, Daily, Rosalin Hawking, MD, 325 mg at 09/25/16 1052 .  atorvastatin (LIPITOR) tablet 40 mg, 40 mg, Oral, QHS, Rosalin Hawking, MD, 40 mg at 09/24/16 2207 .  cefUROXime (CEFTIN) tablet 500 mg, 500 mg, Oral, BID WC, Eugenie Filler, MD, 500 mg at 09/25/16 0748 .  clonazePAM (KLONOPIN) tablet 1 mg, 1 mg, Oral, QHS PRN, Toy Baker, MD, 1 mg at 09/24/16 2207 .  clopidogrel (PLAVIX) tablet 75 mg, 75 mg, Oral, Daily, Eugenie Filler, MD, 75 mg at 09/25/16 1052 .  maprotiline (LUDIOMIL) tablet 50 mg, 50 mg, Oral, QHS, Toy Baker, MD, 50 mg at 09/24/16 2206 .  montelukast (SINGULAIR) tablet 10 mg, 10 mg, Oral, Daily PRN, Toy Baker, MD .  Oxcarbazepine (TRILEPTAL) tablet 300 mg, 300 mg, Oral, BID, Rosalin Hawking, MD, 300 mg  at 09/25/16 1052 .  sodium bicarbonate tablet 650 mg, 650 mg, Oral, BID, Eugenie Filler, MD, 650 mg at 09/25/16 1053 .  topiramate (TOPAMAX) tablet 25 mg, 25 mg, Oral, BID, Rosalin Hawking, MD, 25 mg at 09/25/16  1052  Patients Current Diet: Diet heart healthy/carb modified Room service appropriate? Yes; Fluid consistency: Thin Diet - low sodium heart healthy  Precautions / Restrictions Precautions Precautions: Fall Restrictions Weight Bearing Restrictions: No   Has the patient had 2 or more falls or a fall with injury in the past year?Yes  Prior Activity Level Limited Community (1-2x/wk): Pt. says she goes out to MD appointments as needed and has recently been going for OPPT for wrist fracture  Home Assistive Devices / [place] Devices/Equipment: Environmental consultant (specify type), Wheelchair Home Equipment: Environmental consultant - 2 wheels, Shower seat, Bedside commode, Cane - single point, Wheelchair - manual  Prior Device Use: Indicate devices/aids used by the patient prior to current illness, exacerbation or injury? Manual wheelchair and Walker  Prior Functional Level Prior Function Level of Independence: Independent with assistive device(s) Comments: Pt. reports she has progressively been requiring the assist of cane, rolling walker and wheelchair  Self Care: Did the patient need help bathing, dressing, using the toilet or eating?  Needed some help with bathing  Indoor Mobility: Did the patient need assistance with walking from room to room (with or without device)? Independent  Stairs: Did the patient need assistance with internal or external stairs (with or without device)? Dependent, using ramp  Functional Cognition: Did the patient need help planning regular tasks such as shopping or remembering to take medications? Needed some help; daughter filled pill boxes, husband keeps track of appointments  Current Functional Level Cognition Arousal/Alertness: Awake/alert Overall Cognitive Status: History of cognitive impairments - at baseline Orientation Level: Oriented X4 General Comments: Pt able to report "i had a stroke" pt reports diffculty expressing words. pt reports outpatient  therapy of L wrist due to fall in June with break.  Attention: Focused Focused Attention: Appears intact Memory: Appears intact Awareness: Appears intact    Extremity Assessment (includes Sensation/Coordination) Upper Extremity Assessment: Generalized weakness, RUE deficits/detail, LUE deficits/detail RUE Deficits / Details: decr shoulder flexion 30 degrees against gravity 3- out 5 mmt, brunstrum III grasp LUE Deficits / Details: unable to complete close fist. brunstrom II full passive wrist extension,  decr shoulder flexion 30 degrees against gravity  Lower Extremity Assessment: Defer to PT evaluation RLE Deficits / Details: limited strength in RLE grossly 2/5 strength during isolated assessment RLE Coordination: decreased fine motor   ADLs Overall ADL's : Needs assistance/impaired Grooming: Wash/dry hands, Wash/dry face, Sitting, Minimal assistance Upper Body Bathing: Moderate assistance Lower Body Bathing: Total assistance Toilet Transfer: +2 for physical assistance, Minimal assistance, Stand-pivot Toileting - Clothing Manipulation Details (indicate cue type and reason): in continence noted on arrival due to x2 techs helping with hyigene bed level  General ADL Comments: pt requires weight shift R to L to help facilitate stepping. pt dragging R LE. pt with decr gait length and velocity noted   Mobility Overal bed mobility: Needs Assistance Bed Mobility: Supine to Sit Rolling: Min guard Sidelying to sit: Mod assist Supine to sit: Min assist General bed mobility comments: Assist to scoot fully to EOB. Bed pad used to complete scooting. Pt with difficulty getting feet on floor due to her height.    Transfers Overall transfer level: Needs assistance Equipment used: Rolling walker (2 wheeled) Transfers: Sit to/from Stand Sit to  Stand: Mod assist General transfer comment: Assist to power-up to full standing position. First attempt with 2 person HHA and second attempt with RW. Pt did better  with RW and required less support for balance.    Ambulation / Gait / Stairs / Wheelchair Mobility Ambulation/Gait Ambulation/Gait assistance: Min assist, Mod assist, +2 physical assistance Ambulation Distance (Feet): 10 Feet Assistive device: Rolling walker (2 wheeled), 2 person hand held assist Gait Pattern/deviations: Step-to pattern, Decreased stride length, Trunk flexed, Narrow base of support General Gait Details: With 2 person HHA increased assist was required for weight shift and advancement of RLE. With RW, pt was able to take steps without therapist assist for RLE advancement, however fatigued very quickly.  Gait velocity: decreased Gait velocity interpretation: <1.8 ft/sec, indicative of risk for recurrent falls   Posture / Balance Dynamic Sitting Balance Sitting balance - Comments: patient reports increased right lateral lean recently, able to improve midline positioning with over compensation to the left ( reaching for foot board holding it and then returning to neurtal static sitting) Balance Overall balance assessment: Needs assistance, History of Falls Sitting-balance support: Bilateral upper extremity supported, Feet supported Sitting balance-Leahy Scale: Poor Sitting balance - Comments: patient reports increased right lateral lean recently, able to improve midline positioning with over compensation to the left ( reaching for foot board holding it and then returning to neurtal static sitting) Postural control: Right lateral lean Standing balance support: During functional activity, Bilateral upper extremity supported Standing balance-Leahy Scale: Poor Standing balance comment: reliance on UE support for stability and upright positioning   Special needs/care consideration BiPAP/CPAP   no CPM  no Continuous Drip IV   no Dialysis  no        Life Vest   no Oxygen   no Special Bed   no Trach Size   n/a Wound Vac (area)  no       Skin bruising on hands from IV's                                Bowel mgmt:   Last BM 09/23/16, continent Bladder mgmt: incontinent, wears a pad and depends at home Diabetic mgmt A1c 5.6    Previous Home Environment Living Arrangements: Spouse/significant other  Lives With: Spouse Available Help at Discharge: Family, Available 24 hours/day Type of Home: House Home Layout: One level Home Access: Ramped entrance Bathroom Shower/Tub: Multimedia programmer: Handicapped height Bathroom Accessibility: Yes How Accessible: Accessible via walker, Accessible via wheelchair Home Care Services: Yes Additional Comments: Pt. denies home care but has been going to OPPT for following wrist surgery  Discharge Living Setting Plans for Discharge Living Setting: Patient's home Type of Home at Discharge: House Discharge Home Layout: One level Discharge Home Access: [place] entrance Discharge Bathroom Shower/Tub: Walk-in shower Discharge Bathroom Toilet: Handicapped height Discharge Bathroom Accessibility: Yes How Accessible: Accessible via wheelchair, Accessible via walker Does the patient have any problems obtaining your medications?: No  Social/Family/Support Systems Patient Roles: Spouse, Parent Anticipated Caregiver: Ivone Licht, husband will be primary caregiver with daughter Clint Lipps checking in of her parents several times per week Anticipated Caregiver's Contact Information: Lariya Kinzie, [phone] home, [phone] cell Ability/Limitations of Caregiver: Per pt's daughter, her father Jeneen Rinks can provide supervision as needed but due to his own health issues, cannot provide physical assistance.  Godals are for supervision and was explained to pt. , husband and daughter. Caregiver Availability: 24/7 Discharge  Plan Discussed with Primary Caregiver: Yes Is Caregiver In Agreement with Plan?: Yes Does Caregiver/Family have Issues with Lodging/Transportation while Pt is in Rehab?: No   Goals/Additional Needs Patient/Family  Goal for Rehab: supervision PT/OT; modified independent and supervision SLP Expected length of stay: 13-18 days Cultural Considerations: "[employer]" Dietary Needs: heart healthy, carb modified diet with thin liquids Equipment Needs: TBA Additional Information: Daughter Jenny Reichmann states she has been suggesting to her parents that the time is nearing for the need for ALF living however parents have not been receptive to this idea so far. Pt/Family Agrees to Admission and willing to participate: Yes Program Orientation Provided & Reviewed with Pt/Caregiver Including Roles  & Responsibilities: Yes   Decrease burden of Care through IP rehab admission: n/a   Possible need for SNF placement upon discharge:   Not anticipated   Patient Condition: This patient's medical and functional status has changed since the consult dated: 09/22/16 in which the Rehabilitation Physician determined and documented that the patient's condition is appropriate for intensive rehabilitative care in an inpatient rehabilitation facility. See "History of Present Illness" (above) for medical update. Functional changes are: pt. Required min and mod assistance for ambulation 10' with RW and with 2 person hand hold assist; pt. requires total assist for lower body bathing and mod assist for upper body bathing; pt. With ongoing dysarthria . Patient's medical and functional status update has been discussed with the Rehabilitation physician and patient remains appropriate for inpatient rehabilitation. Will admit to inpatient rehab today.   Preadmission Screen Completed By:  Gerlean Ren, 09/25/2016 1:55 PM ______________________________________________________________________   Discussed status with Dr.  Naaman Plummer on 09/25/16 at  1354  and received telephone approval for admission today.  Admission Coordinator:  Gerlean Ren, time 2440 Sudie Grumbling 09/25/16       Cosigned by: Meredith Staggers, MD at 09/25/2016 2:20 PM   Revision History

## 2016-09-26 NOTE — Progress Notes (Signed)
Social Work Assessment and Plan Social Work Assessment and Plan  Patient Details  Name: Savannah Travis MRN: [id number] Date of Birth: 12/06/1940  Today's Date: 09/26/2016  Problem List:  Patient Active Problem List   Diagnosis Date Noted  . Small vessel disease, cerebrovascular 09/25/2016  . Transient alteration of awareness   . Gait disorder   . UTI due to Klebsiella species 09/23/2016  . Pre-diabetes 09/23/2016  . Spinal stenosis of lumbar region without neurogenic claudication   . Cerebrovascular accident (CVA) due to thrombosis of left middle cerebral artery ([place]) 09/22/2016  . Dysarthria 09/21/2016  . Unresponsiveness 09/20/2016  . Hypokalemia 09/20/2016  . Debility 09/20/2016  . Dehydration 09/20/2016  . Prolonged QT interval 09/20/2016  . Hypertension 09/20/2016  . Acute encephalopathy 09/20/2016   Past Medical History:  Past Medical History:  Diagnosis Date  . Cancer (HCC)    breast  . Headache    migraines  . Hypertension   . S/P wrist surgery 2015, 2017   for fx   Past Surgical History:  Past Surgical History:  Procedure Laterality Date  . BREAST SURGERY Right 2012   lumps removed  . OVARIAN CYST SURGERY  1962   Social History:  reports that she has never smoked. She has never used smokeless tobacco. She reports that she does not drink alcohol or use drugs.  Family / Support Systems Marital Status: Married Patient Roles: Spouse, Parent Spouse/Significant Other: Jeneen Rinks [phone]  [id number] Children: Jenny Reichmann Toler-daughter [phone]-cell Other Supports: grandaughter who is an Therapist, sports here-73M and son in [place] Anticipated Caregiver: Husband and daughter twice a week goes to assist Ability/Limitations of Caregiver: Husband can only do supervision level due to his own health issues.  Daughter works M-F and is in [place], but was assisting prior to admission Caregiver Availability: 24/7 Family Dynamics: Close knit family who helps one another.  Daughter and grandaughter will assist when they can, but also work. Pt needs to be atleast supervision level due to husband's inability to provide physical assist. Will await team evaluations  Social History Preferred language: English Religion:  Cultural Background: No issues Education: High School Read: Yes Write: Yes Employment Status: Retired Freight forwarder Issues: No issues Guardian/Conservator: none-according to MD pt is capable of making her own decisions while here. Will make sure another family member is here if any decision needs to be made.   Abuse/Neglect Physical Abuse: Denies Verbal Abuse: Denies Sexual Abuse: Denies Exploitation of patient/patient's resources: Denies Self-Neglect: Denies  Emotional Status Pt's affect, behavior adn adjustment status: Pt is motivated to do well and has always managed to recover and can adapt if necessary. She reports between she and husband they help one another. She does not want to burden husband and/or daughter. She can see her progress which is encouraging to her. Recent Psychosocial Issues: other health issues-was having spells prior to admission and MD trying to figure out reason why. Had 30 day heart monitor on in the past. Pyschiatric History: History of anxiety and depression takes medications for both and finds them helpful. She is open with her feelings and concerns. May benefit from seeing neuro-psych while here, will check with treatment team. Substance Abuse History: No issues  Patient / Family Perceptions, Expectations & Goals Pt/Family understanding of illness & functional limitations: Pt and daughter can explain her stroke and deficits. She is hopeful she will do well here and make the progress she needs to make to go home. Both talk with the MD and feel their questions  and concerns are being addressed. Premorbid pt/family roles/activities: Wife, Mother, gradnmother, retiree, church member, Health visitor,  etc Anticipated changes in roles/activities/participation: resume Pt/family expectations/goals: Pt states: " I want to be able to do what I was doing before for myself."  Daughter states: " I hope she can be mobile so my Dad and she can manage at home together."  [employer]: Other (Comment) (OPPT for wrist prior to admission) Premorbid Home Care/DME Agencies: Other (Comment) (has bsc, wc and rw) Transportation available at discharge: Husband drives and daughter assists also Resource referrals recommended: Neuropsychology, Support group (specify)  Discharge Planning Living Arrangements: Spouse/significant other Support Systems: Spouse/significant other, Children, Other relatives, Friends/neighbors, Church/faith community Type of Residence: Private residence Insurance Resources: [employer], Multimedia programmer (specify) ([employer]) Museum/gallery curator Resources: [place] Referred: No Living Expenses: Own Money Management: Spouse, Patient Does the patient have any problems obtaining your medications?: No Home Management: has a housecleaner, daughter does some also and pt and husband do the cooking for themselves. Patient/Family Preliminary Plans: Return home with husband who can provide supervision level due to his own health issues. Therapy team has set goals at supervision level. Daughter will plan to go twice a week to continue to assist them. Will await team evlautions and set a safe discharge plan. Social Work Anticipated Follow Up Needs: HH/OP, Support Group  Clinical Impression Pleasant female who between she and her husband has been managing but falling at home and fracturing bones-ie wrist. Sounds like couple need to be looking into senior living apartment/assisted living closer to their daughter. Goals set at supervision level and if can reach can manage at home with her husband. Will work on a safe discharge plan while here. Pt may benefit  from seeing neuro-psych while here.  Elease Hashimoto 09/26/2016, 3:28 PM

## 2016-09-26 NOTE — Progress Notes (Signed)
Subjective/Complaints: Patient states that she has had bladder problems even before her stroke. She has had some issues with incontinence. She does not feel any weakness on the left side of her body. She feels mainly weak on the right side. Currently working with speech therapy notes some word finding deficits as well as dysarthria. Review of systems negative for bowel issues, positive for bladder frequency, negative for shortness of breath, nausea, vomiting, diarrhea.  Objective: Vital Signs: Blood pressure (!) 128/56, pulse 74, temperature 97.5 F (36.4 C), temperature source Oral, resp. rate 16, height 4' 8" (1.422 m), weight 91.2 kg (201 lb 1 oz), SpO2 94 %. No results found. Results for orders placed or performed during the hospital encounter of 09/20/16 (from the past 72 hour(s))  Troponin I     Status: None   Collection Time: 09/23/16 10:19 AM  Result Value Ref Range   Troponin I <0.03 <0.03 ng/mL  Troponin I     Status: None   Collection Time: 09/23/16  3:23 PM  Result Value Ref Range   Troponin I <0.03 <0.03 ng/mL  Troponin I     Status: None   Collection Time: 09/23/16 10:38 PM  Result Value Ref Range   Troponin I <0.03 <0.03 ng/mL  Troponin I     Status: None   Collection Time: 09/24/16  4:52 AM  Result Value Ref Range   Troponin I <0.03 <0.03 ng/mL  Basic metabolic panel     Status: Abnormal   Collection Time: 09/24/16  4:52 AM  Result Value Ref Range   Sodium 140 135 - 145 mmol/L   Potassium 3.8 3.5 - 5.1 mmol/L   Chloride 110 101 - 111 mmol/L   CO2 19 (L) 22 - 32 mmol/L   Glucose, Bld 113 (H) 65 - 99 mg/dL   BUN 18 6 - 20 mg/dL   Creatinine, Ser 1.09 (H) 0.44 - 1.00 mg/dL   Calcium 8.8 (L) 8.9 - 10.3 mg/dL   GFR calc non Af Amer 48 (L) >60 mL/min   GFR calc Af Amer 56 (L) >60 mL/min    Comment: (NOTE) The eGFR has been calculated using the CKD EPI equation. This calculation has not been validated in all clinical situations. eGFR's persistently <60 mL/min  signify possible Chronic Kidney Disease.    Anion gap 11 5 - 15  Troponin I     Status: None   Collection Time: 09/24/16 10:18 AM  Result Value Ref Range   Troponin I <0.03 <0.03 ng/mL  Troponin I     Status: None   Collection Time: 09/24/16  4:21 PM  Result Value Ref Range   Troponin I <0.03 <0.03 ng/mL  Troponin I     Status: None   Collection Time: 09/24/16 11:28 PM  Result Value Ref Range   Troponin I <0.03 <0.03 ng/mL  Troponin I     Status: None   Collection Time: 09/25/16 11:16 AM  Result Value Ref Range   Troponin I <0.03 <0.03 ng/mL     HEENT: normal and +dysarthria Cardio: RRR and No murmurs or extra sounds Resp: CTA B/L and Unlabored GI: BS positive and Nontender, nondistended Extremity:  Pulses positive and No Edema Skin:   Intact Neuro: Alert/Oriented, Cranial Nerve II-XII normal, Normal Sensory, Abnormal Motor Motor strength is 3 minus in the right deltoid, bicep, triceps, grip,3 minus in the right hip flexor, knee extensor, ankle dorsiflexor.4/5 in left deltoid, biceps, triceps, grip, hip flexor, knee extensor, ankle dorsi flexor, Abnormal  [facility] Ataxic/ dec FMC, Tone:  Within Normal Limits, Dysarthric and Aphasic Musc/Skel:  Other Limited right shoulder range of motion without pain. Gen. no acute distress   Assessment/Plan: 1. Functional deficits secondary to left corona radiata infarct, right hemiparesis, aphasia, dysarthria which require 3+ hours per day of interdisciplinary therapy in a comprehensive inpatient rehab setting. Physiatrist is providing close team supervision and 24 hour management of active medical problems listed below. Physiatrist and rehab team continue to assess barriers to discharge/monitor patient progress toward functional and medical goals. FIM:    Function- Upper Body Dressing/Undressing What is the patient wearing?: Hospital gown Function - Lower Body Dressing/Undressing What is the patient wearing?: Hospital Gown  Function -  Toileting Toileting steps completed by patient: Adjust clothing prior to toileting, Adjust clothing after toileting Toileting steps completed by helper: Adjust clothing prior to toileting, Performs perineal hygiene, Adjust clothing after toileting Toileting Assistive Devices: Toilet aid, Grab bar or rail Assist level: Touching or steadying assistance (Pt.75%)  Function - Toilet Transfers Toilet transfer assistive device: Bedside commode Assist level to bedside commode (at bedside): Maximal assist (Pt 25 - 49%/lift and lower) Assist level from bedside commode (at bedside): Maximal assist (Pt 25 - 49%/lift and lower)  Function - Chair/bed transfer Chair/bed transfer activity did not occur: N/A     Function - Comprehension Comprehension: Auditory Comprehension assist level: Follows basic conversation/direction with extra time/assistive device  Function - Expression Expression: Verbal Expression assist level: Expresses basic 90% of the time/requires cueing < 10% of the time.  Function - Social Interaction Social Interaction assist level: Interacts appropriately 90% of the time - Needs monitoring or encouragement for participation or interaction.  Function - Problem Solving Problem solving assist level: Solves basic 75 - 89% of the time/requires cueing 10 - 24% of the time  Function - Memory Memory assist level: Recognizes or recalls 90% of the time/requires cueing < 10% of the time Patient normally able to recall (first 3 days only): Staff names and faces, That he or she is in a hospital, Current season   Medical Problem List and Plan: 1.Decreased functional mobility with lower extremity weakness/altered mental status/question syncopesecondary to left corona radiata infarct -CIR PTOT speech eval today 2. DVT Prophylaxis/Anticoagulation: SCDs. Monitor for any signs of DVT 3. Pain Management/headaches: Topamax 25 mg twice a day 4. Mood: Ludiomil 50 mg daily at  bedtime.Klonopin 1 mg daily at bedtime---pt would like this scheduled 5. Neuropsych: This patient iscapable of making decisions on herown behalf. 6. Skin/Wound Care: Routine skin checks 7. Fluids/Electrolytes/Nutrition: Routine I&O with follow-up chemistries upon admit. 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 Continue Trileptal 300 mg twice a day           -recent EEG negative, no evidence of seizure activity since rehabilitation admission 9.Hypertension. Norvasc 5 mg daily. Monitor with increased mobility 10.Klebsiella UTI. Ceftin initiated 09/24/2016 transitioning from Rocephin. We will assess bladder emptying as well. May have spastic neurogenic bladder in addition to UTI. Hold off on anticholinergic agents until UTI treated 11.Hyperlipidemia. Lipitor 12.History of gout. Allopurinol 300 mg daily. Monitor for any gout flareups. No pain at present  LOS (Days) 1 A FACE TO FACE EVALUATION WAS PERFORMED  KIRSTEINS,ANDREW E 09/26/2016, 9:06 AM

## 2016-09-26 NOTE — Progress Notes (Signed)
Patient information reviewed and entered into eRehab system by Torell Minder, RN, CRRN, PPS Coordinator.  Information including medical coding and functional independence measure will be reviewed and updated through discharge.     Per nursing patient was given "Data Collection Information Summary for Patients in Inpatient Rehabilitation Facilities with attached "Privacy Act Statement-Health Care Records" upon admission.  

## 2016-09-26 NOTE — Patient Care Conference (Signed)
Inpatient RehabilitationTeam Conference and Plan of Care Update Date: 09/26/2016   Time: 10:45 AM    Patient Name: Savannah Travis      Medical Record Number: [id number]  Date of Birth: 10-08-1940 Sex: Female         Room/Bed: 4W07C/4W07C-01 Payor Info: Payor: MEDICARE / Plan: MEDICARE PART A AND B / Product Type: *No Product type* /    Admitting Diagnosis: CVA  Admit Date/Time:  09/25/2016  5:27 PM Admission Comments: No comment available   Primary Diagnosis:  <principal problem not specified> Principal Problem: <principal problem not specified>  Patient Active Problem List   Diagnosis Date Noted  . Small vessel disease, cerebrovascular 09/25/2016  . Transient alteration of awareness   . Gait disorder   . UTI due to Klebsiella species 09/23/2016  . Pre-diabetes 09/23/2016  . Spinal stenosis of lumbar region without neurogenic claudication   . Cerebrovascular accident (CVA) due to thrombosis of left middle cerebral artery ([place]) 09/22/2016  . Dysarthria 09/21/2016  . Unresponsiveness 09/20/2016  . Hypokalemia 09/20/2016  . Debility 09/20/2016  . Dehydration 09/20/2016  . Prolonged QT interval 09/20/2016  . Hypertension 09/20/2016  . Acute encephalopathy 09/20/2016    Expected Discharge Date:    Team Members Present: Physician leading conference: Dr. Alysia Penna Social Worker Present: Ovidio Kin, LCSW Nurse Present: Heather Roberts, RN PT Present: Georjean Mode, Rory Percy, PT OT Present: Clyda Greener, OT SLP Present: Windell Moulding, SLP PPS Coordinator present : Daiva Nakayama, RN, CRRN     Current Status/Progress Goal Weekly Team Focus  Medical   Patient initially with weakness of all 4 limbs. Now right-sided weakness. Dysarthria and aphasia are noted.  Improvement indication, maintain medical stability during rehabilitation stay  Initiate rehabilitation program   Bowel/Bladder     bladder urgency   urgency with urine-MD pursuing this     Swallow/Nutrition/  Hydration             ADL's     eval pending        Mobility     eval pending        Communication     Speech evaluating        Safety/Cognition/ Behavioral Observations            Pain        pain managed-monitor     Skin        na        *See Care Plan and progress notes for long and short-term goals.  Barriers to Discharge:  requires physical assistance for all self-care and mobility    Possible Resolutions to Barriers:  Continue rehabilitation program    Discharge Planning/Teaching Needs:    Home with husband who can not assist with care, can provide supervision level. Daughter involved and assists twice weekly.     Team Discussion:  Was unable to conference due to not whole therapy team has seen and evaluated. Possible goals supervision level and est length of stay 2 weeks.  Revisions to Treatment Plan:  New eval   Continued Need for Acute Rehabilitation Level of Care: The patient requires daily medical management by a physician with specialized training in physical medicine and rehabilitation for the following conditions: Daily direction of a multidisciplinary physical rehabilitation program to ensure safe treatment while eliciting the highest outcome that is of practical value to the patient.: Yes Daily medical management of patient stability for increased activity during participation in an intensive rehabilitation regime.: Yes Daily analysis of  laboratory values and/or radiology reports with any subsequent need for medication adjustment of medical intervention for : Neurological problems  Stepehn Eckard, Gardiner Rhyme 09/26/2016, 1:22 PM

## 2016-09-26 NOTE — Evaluation (Signed)
Occupational Therapy Assessment and Plan  Patient Details  Name: Savannah Travis MRN: [id number] Date of Birth: 1941/03/10  OT Diagnosis: hemiplegia affecting dominant side and muscle weakness (generalized) Rehab Potential: Rehab Potential (ACUTE ONLY): Excellent ELOS: 14-16 days   Today's Date: 09/26/2016 OT Individual Time: 9622-2979 OT Individual Time Calculation (min): 72 min      Problem List: Patient Active Problem List   Diagnosis Date Noted  . Small vessel disease, cerebrovascular 09/25/2016  . Transient alteration of awareness   . Gait disorder   . UTI due to Klebsiella species 09/23/2016  . Pre-diabetes 09/23/2016  . Spinal stenosis of lumbar region without neurogenic claudication   . Cerebrovascular accident (CVA) due to thrombosis of left middle cerebral artery ([place]) 09/22/2016  . Dysarthria 09/21/2016  . Unresponsiveness 09/20/2016  . Hypokalemia 09/20/2016  . Debility 09/20/2016  . Dehydration 09/20/2016  . Prolonged QT interval 09/20/2016  . Hypertension 09/20/2016  . Acute encephalopathy 09/20/2016    Past Medical History:  Past Medical History:  Diagnosis Date  . Cancer (HCC)    breast  . Headache    migraines  . Hypertension   . S/P wrist surgery 2015, 2017   for fx   Past Surgical History:  Past Surgical History:  Procedure Laterality Date  . BREAST SURGERY Right 2012   lumps removed  . OVARIAN CYST SURGERY  1962    Assessment & Plan Clinical Impression: Patient is a 76 y.o. year old female with recent admission to the hospital on 09/20/2016 with episode of unresponsiveness while riding in a car with her family and intermittent headaches.She began to respond after 10-15 minutes. Family had reported 3 such events since 09/17/2016 as well as complaints of progressive weakness and decrease in mobility. She had had a 30 day cardiac event monitor done last summer in [place] records were not yet available.MRI showed acute infarct central left corona  radiata nonhemorrhagic. MRA with focal high-grade left M1 segment stenosis elsewhere negative intracranial MRA.Marland Kitchen  Patient transferred to CIR on 09/25/2016 .    Patient currently requires mod with basic self-care skills secondary to muscle weakness, impaired timing and sequencing, unbalanced muscle activation and decreased coordination and decreased standing balance, hemiplegia and decreased balance strategies.  Prior to hospitalization, patient could complete ADLs with modified independent .  Patient will benefit from skilled intervention to decrease level of assist with basic self-care skills and increase independence with basic self-care skills prior to discharge home with care partner.  Anticipate patient will require 24 hour supervision and follow up home health.  OT - End of Session Activity Tolerance: Tolerates 30+ min activity with multiple rests Endurance Deficit: Yes Endurance Deficit Description: DOE with gait x 12' OT Assessment Rehab Potential (ACUTE ONLY): Excellent Barriers to Discharge: Decreased caregiver support Barriers to Discharge Comments: Spouse cannot provide much physical assist OT Patient demonstrates impairments in the following area(s): Balance;Motor;Endurance;Safety OT Basic ADL's Functional Problem(s): Eating;Grooming;Bathing;Dressing;Toileting OT Advanced ADL's Functional Problem(s): Simple Meal Preparation;Light Housekeeping;Laundry OT Transfers Functional Problem(s): Tub/Shower;Toilet OT Additional Impairment(s): Fuctional Use of Upper Extremity OT Plan OT Intensity: Minimum of 1-2 x/day, 45 to 90 minutes OT Frequency: 5 out of 7 days OT Duration/Estimated Length of Stay: 14-16 days OT Treatment/Interventions: Balance/vestibular training;Discharge planning;Self Care/advanced ADL retraining;Therapeutic Activities;UE/LE Coordination activities;Therapeutic Exercise;Patient/family education;Functional mobility training;Disease mangement/prevention;Cognitive  remediation/compensation;DME/adaptive equipment instruction;Neuromuscular re-education;Psychosocial support;Splinting/orthotics;UE/LE Strength taining/ROM OT Self Feeding Anticipated Outcome(s): modified independence OT Basic Self-Care Anticipated Outcome(s): supervision level OT Toileting Anticipated Outcome(s): supervision level OT Bathroom Transfers Anticipated Outcome(s): supervision level  OT Recommendation Patient destination: Home Follow Up Recommendations: Home health OT Equipment Recommended: None recommended by OT   Skilled Therapeutic Intervention Began working on selfcare retraining sit to stand shower level during session.  Pt needing mod assist for sit to stand with LB bathing and dressing tasks as well as mod to max assist for stand pivot transfers with decreased ability to advance the RLE.  Decreased ability to reach either foot for doffing socks, washing feet, or donning TEDS and new gripper socks.  She uses the LUE for all functional tasks with increased time but needed assistance for peri cleaning as well as for combing hair.  Discussed ELOS and expected progression to supervision level goals at discharge.  Pt left in wheelchair at end of session with call button and phone in reach.  She was able to verbalize correct use of the call button if she needed something.   OT Evaluation Precautions/Restrictions  Precautions Precautions: Fall Restrictions Weight Bearing Restrictions: No  Pain Pain Assessment Pain Assessment: No/denies pain Home Living/Prior Functioning Home Living Family/patient expects to be discharged to:: Private residence Living Arrangements: Spouse/significant other Available Help at Discharge: Family, Available 24 hours/day Type of Home: House Home Access: Ramped entrance Home Layout: One level Bathroom Shower/Tub: Multimedia programmer: Handicapped height Bathroom Accessibility: Yes Additional Comments: Pt. denies home care but has been  going to OPPT for following L wrist surgery due to fall at home  Lives With: Spouse IADL History Homemaking Responsibilities: Yes Meal Prep Responsibility: Secondary Laundry Responsibility: Secondary Occupation: Retired Prior Function Level of Independence: Requires assistive device for independence  Able to Take Stairs?: No Driving: No Vocation: Retired Leisure: Hobbies-yes (Comment) Comments: She has an inside dog that is blind.    Mrs. Anschutz likes to watch birds from her porch; Pt. reports she has progressively been requiring the assist of cane, rolling walker and wheelchair ADL  See Function Section of chart for details  Vision/Perception  Vision- History Baseline Vision/History: Wears glasses Wears Glasses: At all times Patient Visual Report: No change from baseline Vision- Assessment Vision Assessment?: No apparent visual deficits;Yes Eye Alignment: Within Functional Limits Ocular Range of Motion: Within Functional Limits Alignment/Gaze Preference: Within Defined Limits Tracking/Visual Pursuits: Able to track stimulus in all quads without difficulty Convergence: Within functional limits Visual Fields: No apparent deficits Praxis Praxis-Other Comments: WFLS for selfcare tasks  Cognition Overall Cognitive Status: History of cognitive impairments - at baseline Arousal/Alertness: Awake/alert Orientation Level: Person;Place;Situation Person: Oriented Place: Oriented Year: 2018 Month: January Day of Week: Correct Memory: Impaired (per pt) Immediate Memory Recall: Sock;Blue;Bed Memory Recall: Sock;Blue;Bed Memory Recall Sock: Without Cue Memory Recall Blue: Without Cue Memory Recall Bed: Without Cue Attention: Sustained;Selective Focused Attention: Appears intact Sustained Attention: Appears intact Selective Attention: Appears intact Awareness: Appears intact Problem Solving: Appears intact Safety/Judgment: Appears intact Sensation Sensation Light Touch: Appears  Intact Stereognosis: Appears Intact Hot/Cold: Appears Intact Proprioception: Appears Intact Coordination Gross Motor Movements are Fluid and Coordinated: No Fine Motor Movements are Fluid and Coordinated: No Coordination and Movement Description: Pt uses the RUE at a diminished level for selfcare tasks with mod assist.  Slight decreased ability for overhead activities such as combing her hair with min assist needed to pull pants over hips on the right side.  Heel Shin Test: limited speed, excursion, bil, R more affected Motor  Motor Motor: Hemiplegia Motor - Skilled Clinical Observations: Mild right UE and LE hemiparesis Mobility  Bed Mobility Bed Mobility: Rolling Right;Rolling Left;Right Sidelying to Sit;Sit  to Supine;Supine to Sit Rolling Right: 5: Supervision Rolling Left: 5: Supervision Right Sidelying to Sit: 3: Mod assist Right Sidelying to Sit Details: Manual facilitation for placement;Manual facilitation for weight bearing;Manual facilitation for weight shifting;Verbal cues for sequencing;Verbal cues for technique Supine to Sit: 3: Mod assist;With rails Sit to Supine: 5: Supervision Transfers Transfers: Sit to Stand;Stand to Sit Sit to Stand: 3: Mod assist;With armrests Stand to Sit: 3: Mod assist;With upper extremity assist  Trunk/Postural Assessment  Cervical Assessment Cervical Assessment: Within Functional Limits Thoracic Assessment Thoracic Assessment: Within Functional Limits Lumbar Assessment Lumbar Assessment: Within Functional Limits Postural Control Postural Control: Deficits on evaluation  Balance Balance Balance Assessed: Yes Static Sitting Balance Static Sitting - Level of Assistance: 5: Stand by assistance Dynamic Sitting Balance Dynamic Sitting - Balance Support: During functional activity Dynamic Sitting - Level of Assistance: 5: Stand by assistance Static Standing Balance Static Standing - Balance Support: During functional activity Static  Standing - Level of Assistance: 3: Mod assist Dynamic Standing Balance Dynamic Standing - Balance Support: During functional activity Dynamic Standing - Level of Assistance: 2: Max assist Extremity/Trunk Assessment RUE Assessment RUE Assessment: Exceptions to [facility] (Isolated movement in all joints noted.  AROM shoulder flexion 0-120 degrees with strength at 3-/5, elbow, wrist, and digits all 3+/5.  Slower FM coordination noted with finger to thumb testing. ) LUE Assessment LUE Assessment: Within Functional Limits (pt with history of left wrist fracture)   See Function Navigator for Current Functional Status.   Refer to Care Plan for Long Term Goals  Recommendations for other services: Therapeutic Recreation  Stress management   Discharge Criteria: Patient will be discharged from OT if patient refuses treatment 3 consecutive times without medical reason, if treatment goals not met, if there is a change in medical status, if patient makes no progress towards goals or if patient is discharged from hospital.  The above assessment, treatment plan, treatment alternatives and goals were discussed and mutually agreed upon: by patient  Lovelee Forner OTR/L 09/26/2016, 1:19 PM

## 2016-09-26 NOTE — Evaluation (Addendum)
Physical Therapy Assessment and Plan  Patient Details  Name: Savannah Travis MRN: [id number] Date of Birth: April 09, 1941  PT Diagnosis: Abnormality of gait, Cognitive deficits, Hemiparesis dominant and Pain in L knee on stairs Rehab Potential: Good ELOS: 14-17   Today's Date: 09/26/2016 PT Individual Time: 1105-1205 PT Individual Time Calculation (min): 60 min     Problem List: Patient Active Problem List   Diagnosis Date Noted  . Small vessel disease, cerebrovascular 09/25/2016  . Transient alteration of awareness   . Gait disorder   . UTI due to Klebsiella species 09/23/2016  . Pre-diabetes 09/23/2016  . Spinal stenosis of lumbar region without neurogenic claudication   . Cerebrovascular accident (CVA) due to thrombosis of left middle cerebral artery ([place]) 09/22/2016  . Dysarthria 09/21/2016  . Unresponsiveness 09/20/2016  . Hypokalemia 09/20/2016  . Debility 09/20/2016  . Dehydration 09/20/2016  . Prolonged QT interval 09/20/2016  . Hypertension 09/20/2016  . Acute encephalopathy 09/20/2016    Past Medical History:  Past Medical History:  Diagnosis Date  . Cancer (HCC)    breast  . Headache    migraines  . Hypertension   . S/P wrist surgery 2015, 2017   for fx   Past Surgical History:  Past Surgical History:  Procedure Laterality Date  . BREAST SURGERY Right 2012   lumps removed  . OVARIAN CYST SURGERY  1962    Assessment & Plan Clinical Impression: Dunya Meiners a 76 y.o.right handed femalewith history of Depression,breast cancer, migraine headaches and her chart review patient lives with spouse. Independent with a walker for short distances. One level home with ramped entrance. She has had some recent falls.. Presented 09/20/2016 with episode of unresponsiveness while riding in a car with her family and intermittent headaches.She began to respond after 10-15 minutes. Family had reported 3 such events since 09/17/2016 as well as complaints of progressive weakness  and decrease in mobility. 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 MRA with focal high-grade left M1 segment stenosis elsewhere negative intracranial MRA. Patient did not receive TPA. Echocardiogram with ejection fraction of 97% grade 1 diastolic dysfunction.EEG negative. A carotid Doppler 08/08/2016 was negative.Neurology consulted and maintained on aspirin and Plavix for CVA prophylaxis 3 months then Plavix alone. Findings of questionable prediabetes mellitus with hemoglobin A1c 5.6. Klebsiella UTI intravenous Rocephin transitioned to oral Ceftin. MRI cervical lumbar thoracic spine completed due to persistent weakness of lower extremities and felt to be unremarkable as evaluated by Dr. Kathyrn Sheriff Patient transferred to [facility] on 09/25/2016 .   Patient currently requires max with mobility secondary to decreased cardiorespiratoy endurance, impaired timing and sequencing and motor apraxia and decreased standing balance, hemiplegia and decreased balance strategies.  Prior to hospitalization, patient was supervision with mobility and lived with Spouse in a House home.  Home access is  Ramped entrance.  Patient will benefit from skilled PT intervention to maximize safe functional mobility, minimize fall risk and decrease caregiver burden for planned discharge home with 24 hour supervision.  Anticipate patient will benefit from follow up [place] at discharge.  PT - End of Session Activity Tolerance: Tolerates < 10 min activity with changes in vital signs Endurance Deficit: Yes Endurance Deficit Description: DOE with gait x 12' PT Assessment Rehab Potential (ACUTE/IP ONLY): Good Barriers to Discharge: Decreased caregiver support PT Patient demonstrates impairments in the following area(s): Balance;Endurance;Motor;Pain;Safety PT Transfers Functional Problem(s): Bed Mobility;Bed to  Chair;Car;Furniture PT Locomotion Functional Problem(s): Ambulation;Wheelchair Mobility;Stairs PT Plan PT Intensity: Minimum of 1-2 x/day ,45 to 90 minutes PT Frequency: 5 out of 7 days PT Duration Estimated Length of Stay: 14-17 PT Treatment/Interventions: Ambulation/gait training;Balance/vestibular training;Cognitive remediation/compensation;Community reintegration;Discharge planning;Functional mobility training;DME/adaptive equipment instruction;Neuromuscular re-education;Pain management;Patient/family education;Splinting/orthotics;Psychosocial support;Stair training;Therapeutic Activities;Therapeutic Exercise;UE/LE Coordination activities;UE/LE Strength taining/ROM;Wheelchair propulsion/positioning PT Transfers Anticipated Outcome(s): supervision basic and car PT Locomotion Anticipated Outcome(s): supervision gait x 150' controlled , 200' community, and 50' home; supervision up/down ramp with LRAD and min assist up/down 12 steps wiht 2 rails; supervison w/c x 150' controlled and 50' home setting PT Recommendation Recommendations for Other Services: Therapeutic Recreation consult Therapeutic Recreation Interventions: Pet therapy;Outing/community reintergration Follow Up Recommendations: Home health PT Patient destination: Home Equipment Recommended: To be determined Equipment Details: pt owns a w/c, SPC and a rolling walker ( 2wheels vs 4 wheels?)  Skilled Therapeutic Intervention- eval completed LTGs and ELOS explained to pt. She reported multiple falls with at least2  injuries in last year.  She had a head laceration requiring 14 staples and fx'd L wrist requiring surgery.  Pt described "lightning bolt" feeling in her head during the spells which result in falls. She reported her husband will be with her 24/7 but is unable to provide physical assistance due to heart problems. She voluntarily gave up driving last summer due to "spells". Pt is quite short in stature which impacts her mobility.  Falls risk reviewed with pt.  Pt left resting in w/c with quick release belt applied and all needs within reach.   PT Evaluation Precautions/Restrictions Precautions Precautions: Fall Pain Pain Assessment Pain Assessment: No/denies pain Home Living/Prior Functioning Home Living Available Help at Discharge: Family;Available 24 hours/day Type of Home: House Home Access: Ramped entrance Home Layout: One level Bathroom Shower/Tub: Multimedia programmer: Handicapped height Bathroom Accessibility: Yes Additional Comments: Pt. denies home care but has been going to OPPT for following L wrist surgery due to fall at home  Lives With: Spouse Prior Function Level of Independence: Requires assistive device for independence  Able to Take Stairs?: No Driving: No Vocation: Retired Leisure: Hobbies-yes (Comment) Comments: has an inside dog (blind) ; likes to watch birds from her porch; Pt. reports she has progressively been requiring the assist of cane, rolling walker and wheelchair Vision/Perception - pt stated no change in vision; she wears trifocals due to previous frequent use of computer; may not be helpful at this time due to multiple falls    Cognition Overall Cognitive Status: History of cognitive impairments - at baseline Arousal/Alertness: Awake/alert Orientation Level: Oriented X4 Attention: Alternating Alternating Attention: Appears intact Memory: Impaired (per pt) Awareness: Appears intact Problem Solving: Appears intact Safety/Judgment: Appears intact Sensation Sensation Light Touch: Appears Intact Proprioception: Appears Intact Coordination Heel Shin Test: limited speed, excursion, bil, R more affected Motor  Motor Motor: Motor apraxia (?; unable to move RLE on command at times)  Mobility Bed Mobility Bed Mobility: Rolling Right;Rolling Left;Right Sidelying to Sit;Sit to Supine;Supine to Sit Rolling Right: 5: Supervision Rolling Left: 5: Supervision Right  Sidelying to Sit: 3: Mod assist Right Sidelying to Sit Details: Manual facilitation for placement;Manual facilitation for weight bearing;Manual facilitation for weight shifting;Verbal cues for sequencing;Verbal cues for technique Supine to Sit: 3: Mod assist Sit to Supine: 5: Supervision Transfers Transfers: Yes Stand Pivot Transfers: 2: Max assist Stand Pivot Transfer Details: Manual facilitation for placement;Manual facilitation for weight bearing;Manual facilitation for weight shifting;Verbal cues for sequencing Locomotion  Ambulation Ambulation: Yes Ambulation/Gait Assistance: 2: Max  assist Ambulation Distance (Feet): 12 Feet Assistive device: None Ambulation/Gait Assistance Details: Manual facilitation for placement;Manual facilitation for weight shifting;Verbal cues for technique Gait Gait: Yes Gait Pattern: Impaired Gait Pattern: Narrow base of support;Decreased hip/knee flexion - right;Decreased hip/knee flexion - left;Decreased dorsiflexion - right;Decreased trunk rotation;Shuffle;Decreased stride length;Step-through pattern;Decreased step length - right Gait velocity: decreased Stairs / Additional Locomotion Stairs: Yes Stairs Assistance: 3: Mod assist Stairs Assistance Details: Manual facilitation for placement;Manual facilitation for weight bearing;Verbal cues for gait pattern;Verbal cues for technique Stair Management Technique: Two rails;Alternating pattern (to ascend ; backwards step to to descend) Number of Stairs: 8 Height of Stairs: 3 Ramp: Not tested (comment) Curb: Not tested (comment) Wheelchair Mobility Wheelchair Mobility: Yes Wheelchair Assistance: 3: Mod assist Wheelchair Assistance Details: Verbal cues for safe use of DME/AE;Verbal cues for technique;Verbal cues for Information systems manager: Both upper extremities (pt choice) Wheelchair Parts Management: Needs assistance Distance: 50  Trunk/Postural Assessment  Cervical  Assessment Cervical Assessment: Within Functional Limits Thoracic Assessment Thoracic Assessment: Within Functional Limits Postural Control Postural Control: Deficits on evaluation  Balance Balance Balance Assessed: Yes Static Sitting Balance Static Sitting - Level of Assistance: 6: Modified independent (Device/Increase time) Dynamic Sitting Balance Dynamic Sitting - Level of Assistance: 5: Stand by assistance Static Standing Balance Pt leans backward Static Standing - Level of Assistance: 3: Mod assist Dynamic Standing Balance Dynamic Standing - Level of Assistance: 2: Max assist Extremity Assessment      RLE Assessment RLE Assessment: Exceptions to [facility] RLE Strength RLE Overall Strength Comments: grossly in sittihng: hip flexion 2+/5, knee ext 4/5, ankle DF 3+/5 LLE Assessment LLE Assessment: Within Functional Limits   See Function Navigator for Current Functional Status.   Refer to Care Plan for Long Term Goals  Recommendations for other services: Therapeutic Recreation  Pet therapy, Kitchen group and Outing/community reintegration  Discharge Criteria: Patient will be discharged from PT if patient refuses treatment 3 consecutive times without medical reason, if treatment goals not met, if there is a change in medical status, if patient makes no progress towards goals or if patient is discharged from hospital.  The above assessment, treatment plan, treatment alternatives and goals were discussed and mutually agreed upon: by patient  Mikenzi Raysor 09/26/2016, 12:38 PM

## 2016-09-26 NOTE — Care Management Note (Addendum)
[facility] Individual Statement of Services  Patient Name:  Savannah Travis  Date:  09/26/2016  Welcome to the [facility].  Our goal is to provide you with an individualized program based on your diagnosis and situation, designed to meet your specific needs.  With this comprehensive rehabilitation program, you will be expected to participate in at least 3 hours of rehabilitation therapies Monday-Friday, with modified therapy programming on the weekends.  Your rehabilitation program will include the following services:  Physical Therapy (PT), Occupational Therapy (OT), Speech Therapy (ST), 24 hour per day rehabilitation nursing, Therapeutic Recreaction (TR), Case Management (Social Worker) and Rehabilitation Medicine  Weekly team conferences will be held on Wednesday to discuss your progress.  Your Social Worker will talk with you frequently to get your input and to update you on team discussions.  Team conferences with you and your family in attendance may also be held.  Expected length of stay: 14-17 days Overall anticipated outcome: supervision with cueing  Depending on your progress and recovery, your program may change. Your Social Worker will coordinate services and will keep you informed of any changes. Your Social Worker's name and contact numbers are listed  below.  The following services may also be recommended but are not provided by the [facility] will be made to provide these services after discharge if needed.  Arrangements include referral to agencies that provide these services.  Your insurance has been verified to be: [place] primary doctor is:  [place] [employer]  Pertinent information will be shared with your doctor and your insurance company.  Social Worker:  Ovidio Kin, [place] or ([phone]  Information discussed with and copy given to patient by: Elease Hashimoto, 09/26/2016, 1:26 PM

## 2016-09-26 NOTE — Evaluation (Signed)
Speech Language Pathology Assessment and Plan  Patient Details  Name: Savannah Travis MRN: [id number] Date of Birth: 03-17-1941  SLP Diagnosis: Dysarthria;Speech and Language deficits  Rehab Potential: Good ELOS: 10-14 days     Today's Date: 09/26/2016 SLP Individual Time: 0800-0900 SLP Individual Time Calculation (min): 60 min    Problem List: Patient Active Problem List   Diagnosis Date Noted  . Small vessel disease, cerebrovascular 09/25/2016  . Transient alteration of awareness   . Gait disorder   . UTI due to Klebsiella species 09/23/2016  . Pre-diabetes 09/23/2016  . Spinal stenosis of lumbar region without neurogenic claudication   . Cerebrovascular accident (CVA) due to thrombosis of left middle cerebral artery ([place]) 09/22/2016  . Dysarthria 09/21/2016  . Unresponsiveness 09/20/2016  . Hypokalemia 09/20/2016  . Debility 09/20/2016  . Dehydration 09/20/2016  . Prolonged QT interval 09/20/2016  . Hypertension 09/20/2016  . Acute encephalopathy 09/20/2016   Past Medical History:  Past Medical History:  Diagnosis Date  . Cancer (HCC)    breast  . Headache    migraines  . Hypertension   . S/P wrist surgery 2015, 2017   for fx   Past Surgical History:  Past Surgical History:  Procedure Laterality Date  . BREAST SURGERY Right 2012   lumps removed  . OVARIAN CYST SURGERY  1962    Assessment / Plan / Recommendation Clinical Impression Savannah Travis a 76 y.o.right handed female who presented 09/20/2016 with episode of unresponsiveness while riding in a car with her family and intermittent headaches.She began to respond after 10-15 minutes. Family had reported 3 such events since 09/17/2016 as well as complaints of progressive weakness and decrease in mobility. 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 Patient was admitted for a comprehensive rehabilitation program on 09/25/2016.  SLP evaluation completed on 09/26/2016 with the following results:   Pt presents with a mild-moderate dysarthria due to right sided oral motor weakness and decreased breath support for speech resulting in low vocal intensity and imprecise articulation of consonants.  The abovementioned deficits impact intelligibility at the conversational level.  Pt also exhibits mild higher level word finding impairments with intermittent anomia and perseveration.  Pt endorses good awareness of impairments.   Given the above, pt would benefit from skilled ST while inpatient in order to maximize functional independence and reduce burden of care prior to discharge.     Skilled Therapeutic Interventions          Cognitive-linguistic evaluation completed with results and recommendations reviewed with family.   During functional conversations with therapist, pt required overall min assist verbal cues to achieve intelligibility.  Pt also benefited from min assist verbal cues to correct verbal errors. Pt was left in bed with bed alarm set and call bell within reach.  Pt verbalized understanding and was in agreement with recommended plan of care.      SLP Assessment  Patient will need skilled [facility] during CIR admission    Recommendations  Patient destination: Home Follow up Recommendations: Home Health SLP;Outpatient SLP;24 hour supervision/assistance Equipment Recommended: None recommended by SLP    SLP Frequency 3 to 5 out of 7 days   SLP Duration  SLP Intensity  SLP Treatment/Interventions 10-14 days   Minumum of 1-2 x/day, 30 to 90 minutes  Cognitive remediation/compensation;Cueing hierarchy;Functional tasks;Patient/family education;Internal/external aids    Pain Pain Assessment Pain Assessment: No/denies pain  Prior Functioning Cognitive/Linguistic Baseline: Baseline deficits Type of Home: House  Lives With: Spouse Available  Help at Discharge: Family;Available 24 hours/day Vocation: Retired  Function:  Eating Eating   Modified  Consistency Diet: No Eating Assist Level: More than reasonable amount of time;Set up assist for   Eating Set Up Assist For: Opening containers       Cognition Comprehension Comprehension assist level: Follows basic conversation/direction with extra time/assistive device  Expression   Expression assist level: Expresses basic 75 - 89% of the time/requires cueing 10 - 24% of the time. Needs helper to occlude trach/needs to repeat words.  Social Interaction Social Interaction assist level: Interacts appropriately with others with medication or extra time (anti-anxiety, antidepressant).  Problem Solving Problem solving assist level: Solves basic 90% of the time/requires cueing < 10% of the time  Memory Memory assist level: Recognizes or recalls 90% of the time/requires cueing < 10% of the time   Short Term Goals: Week 1: SLP Short Term Goal 1 (Week 1): Pt will utilize overarticulation and increased vocal intensity to achieve intelligibility in conversations in a minimally distracting environment with supervision.  SLP Short Term Goal 2 (Week 1): Pt will utilize compensatory word finding strategies in conversations with supervision verbal cues.   Refer to Care Plan for Long Term Goals  Recommendations for other services: None   Discharge Criteria: Patient will be discharged from SLP if patient refuses treatment 3 consecutive times without medical reason, if treatment goals not met, if there is a change in medical status, if patient makes no progress towards goals or if patient is discharged from hospital.  The above assessment, treatment plan, treatment alternatives and goals were discussed and mutually agreed upon: by patient  Emilio Math 09/26/2016, 11:19 AM

## 2016-09-27 ENCOUNTER — Inpatient Hospital Stay (HOSPITAL_COMMUNITY): Payer: Medicare Other | Admitting: Speech Pathology

## 2016-09-27 ENCOUNTER — Inpatient Hospital Stay (HOSPITAL_COMMUNITY): Payer: Medicare Other | Admitting: Occupational Therapy

## 2016-09-27 ENCOUNTER — Inpatient Hospital Stay (HOSPITAL_COMMUNITY): Payer: Medicare Other | Admitting: Physical Therapy

## 2016-09-27 DIAGNOSIS — I639 Cerebral infarction, unspecified: Secondary | ICD-10-CM

## 2016-09-27 DIAGNOSIS — R4701 Aphasia: Secondary | ICD-10-CM

## 2016-09-27 MED ORDER — SODIUM CHLORIDE 0.45 % IV SOLN
INTRAVENOUS | Status: DC
  Administered 2016-09-27 – 2016-10-01 (×3): via INTRAVENOUS

## 2016-09-27 NOTE — Progress Notes (Signed)
Physical Therapy Session Note  Patient Details  Name: Savannah Travis MRN: [id number] Date of Birth: 01-Jul-1941  Today's Date: 09/26/2016 PT Individual Time: 1520-1600 PT Individual Time Calculation: 40 min       Short Term Goals: Week 1:  PT Short Term Goal 1 (Week 1): pt will move supine> sitting with min assist PT Short Term Goal 2 (Week 1): pt will transfer bed>< w/c with mod assist PT Short Term Goal 3 (Week 1): pt will propel w/c x 100' with min assist for strengthening PT Short Term Goal 4 (Week 1): pt will perform gait x 50' with mod assist  Skilled Therapeutic Interventions/Progress Updates:     Pt received sitting in [facility] and agreeable to PT  PT transported patient to rehab gym in [facility]. Gait training with RW x 74ft with min assist from PT. Patient noted to have decreased step length on the R LE as well as constant foot drag with each step. No change in gait pattern with cues from.   PT assessed patient sitting balance and trunk mobility. Patient noted to be severely limited in trunk rotation to the R and trunk extension due to premorbid scoliosis; ~25% full range of motion. Flexion and L rotation WFL.   Gait training over uneven surface x 10 ft with mod assist from PT with RW. Manual facilitation required for weight shifting and AD management to allow proper forward progression and step length on the RLE. Mild L Knee pain reported.   Throughout treatment, patient performed sit<>stand with min-mod assist and min cues for proper UE placement for safety.  Pt returned to room and left sitting in [facility] with call bell in reach.   Therapy Documentation Precautions:  Precautions Precautions: Fall Restrictions Weight Bearing Restrictions: No Vital Signs: Therapy Vitals Temp: 97.9 F (36.6 C) Temp Source: Oral Pulse Rate: 75 Resp: 18 BP: (!) 117/52 Patient Position (if appropriate): Lying Oxygen Therapy SpO2: 96 % O2 Device: Not Delivered   Pain:  0/10    See Function  Navigator for Current Functional Status.   Therapy/Group: Individual Therapy  Lorie Phenix 09/27/2016, 5:53 AM

## 2016-09-27 NOTE — Progress Notes (Signed)
Occupational Therapy Session Note  Patient Details  Name: Savannah Travis MRN: [id number] Date of Birth: November 18, 1940  Today's Date: 09/27/2016 OT Individual Time: 1300-1359 OT Individual Time Calculation (min): 59 min     Short Term Goals: Week 1:  OT Short Term Goal 1 (Week 1): Pt will completed LB bathing with min assist sit to stand.  OT Short Term Goal 2 (Week 1): Pt will complete LB dressing sit to stand with min assist.  OT Short Term Goal 3 (Week 1): Pt will complete stand pivot toilet transfers with min asisst using the RW.  OT Short Term Goal 4 (Week 1): Pt will complete walk-in shower transfers with min assist using the RW.  OT Short Term Goal 5 (Week 1): Pt will use the RUE to comb her hair with supervision.   Skilled Therapeutic Interventions/Progress Updates:     Upon entering the room, pt seated in wheelchair with no c/o pain this session. OT propelled pt via wheelchair to ADL apartment for time management. Pt performing furniture transfer onto soft sofa with mod lifting assistance to come to stand from seated position. Pt needing min verbal cues for hand placement. OT educated and demonstrated simulated walk in shower transfer based on patients descriptions of home environment. Pt side stepping over threshold with mod A overall as she needed steadying assistance , lifting assistance from seat, and cues for proper technique. Pt engaged in towel and washcloth folding for B UE coordination during functional task. OT assisted pt back to room via wheelchair. Pt needing assistance for stand pivot transfer onto elevated toilet seat. Pt performed hygiene and clothing management with increased time and steady assistance for balance. Pt seated in wheelchair at end of session with call bell and all needed items within reach.   Therapy Documentation Precautions:  Precautions Precautions: Fall Restrictions Weight Bearing Restrictions: No General:   Vital Signs: Therapy Vitals Temp: 97.6 F  (36.4 C) Temp Source: Oral Pulse Rate: 82 Resp: 18 BP: (!) 146/64 (RN notified) Patient Position (if appropriate): Sitting Oxygen Therapy SpO2: 99 % O2 Device: Not Delivered Pain: Pain Assessment Pain Assessment: No/denies pain  See Function Navigator for Current Functional Status.   Therapy/Group: Individual Therapy  Gypsy Decant 09/27/2016, 4:32 PM

## 2016-09-27 NOTE — Progress Notes (Signed)
Physical Therapy Session Note  Patient Details  Name: Savannah Travis MRN: [id number] Date of Birth: [birth year]  Today's Date: 09/27/2016 PT Individual Time: 1001-1100 PT Individual Time Calculation (min): 59 min    Short Term Goals: Week 1:  PT Short Term Goal 1 (Week 1): pt will move supine> sitting with min assist PT Short Term Goal 2 (Week 1): pt will transfer bed>< w/c with mod assist PT Short Term Goal 3 (Week 1): pt will propel w/c x 100' with min assist for strengthening PT Short Term Goal 4 (Week 1): pt will perform gait x 50' with mod assist  Skilled Therapeutic Interventions/Progress Updates:     Pt received supine in bed and agreeable to PT. Supine>sit transfer with min assist and mod cues for proper UE placement.   Sit<>stand x 3 to don pants mod assist progressing to min assist with UE support on RW.   Stand pivot transfer to [facility] with mod assist from PT. Min cues for improved R LE movement to prevent Lateral/posterior LOB.   WC mobility in hall x 70f with min-mod assist from PT to maintain straight path. Hand over hand instruction for improved R UE ROM to allow increased control of wheel   Standing balance on wedge x 1 minute. Standing balance on wedge with UE fine motor task to place mini figure in cup on contralateral side of body. X 12 BUE Min assist form PT to prevent posterior LOB as well as improved anterior Weight shift.    Foot taps on 2 inch and 4 inch step with BUE support. Min assist from PT for improved weight shifting over the R LE.   Variable gait training in parallel bars with anterior and posterior progression 118fx 3 each direction. Min assist from PT and min cues for increased hip/knee flexion to prevent foot drag  [facility] and all needs met.      Therapy Documentation Precautions:  Precautions Precautions: Fall Restrictions Weight Bearing Restrictions: No  Pain: Pain  Assessment Pain Assessment: No/denies pain   See Function Navigator for Current Functional Status.   Therapy/Group: Individual Therapy  AuLorie Phenix/12/2016, 11:04 AM

## 2016-09-27 NOTE — Progress Notes (Signed)
Occupational Therapy Session Note  Patient Details  Name: Savannah Travis MRN: [id number] Date of Birth: May 30, 1941  Today's Date: 09/27/2016 OT Individual Time: 1117-1200 OT Individual Time Calculation (min): 43 min     Short Term Goals: Week 1:  OT Short Term Goal 1 (Week 1): Pt will completed LB bathing with min assist sit to stand.  OT Short Term Goal 2 (Week 1): Pt will complete LB dressing sit to stand with min assist.  OT Short Term Goal 3 (Week 1): Pt will complete stand pivot toilet transfers with min asisst using the RW.  OT Short Term Goal 4 (Week 1): Pt will complete walk-in shower transfers with min assist using the RW.  OT Short Term Goal 5 (Week 1): Pt will use the RUE to comb her hair with supervision.   Skilled Therapeutic Interventions/Progress Updates:    Pt finished toileting tasks to start session with min assist for clothing and hygiene and mod assist for stand pivot transfer back to the wheelchair from the [facility].  Therapist rolled her down to the gym for work on [employer] coordination tasks.  Sitting EOM had pt complete 9 hole peg test with both the right and left UEs.  She completed this in 1 min 8 secs with the RUE and 5  mins 32 secs with the left.  Completed second set with the RUE in 4 mins and 20 seconds.   Issued foam pieces for pt to work on [employer] coordination and for functional reach to be completed in the room as well.  Pt with increased difficulty manipulating small pegs secondary to numbness in her finger tips per her report.  Pt left in wheelchair at end of session with call button and phone in reach eating lunch.     Therapy Documentation Precautions:  Precautions Precautions: Fall Restrictions Weight Bearing Restrictions: No  Pain: Pain Assessment Pain Assessment: No/denies pain ADL:  See Function Navigator for Current Functional Status.   Therapy/Group: Individual Therapy  Bianca Raneri OTR/L 09/27/2016, 1:13 PM

## 2016-09-27 NOTE — Progress Notes (Signed)
Speech Language Pathology Daily Session Note  Patient Details  Name: Savannah Travis MRN: [id number] Date of Birth: 03-10-1941  Today's Date: 09/27/2016 SLP Individual Time: 1430-1500 SLP Individual Time Calculation (min): 30 min   Short Term Goals: Week 1: SLP Short Term Goal 1 (Week 1): Pt will utilize overarticulation and increased vocal intensity to achieve intelligibility in conversations in a minimally distracting environment with supervision.  SLP Short Term Goal 2 (Week 1): Pt will utilize compensatory word finding strategies in conversations with supervision verbal cues.   Skilled Therapeutic Interventions:  Pt was seen for skilled ST targeting speech intelligibility goals and education regarding compensatory strategies.  SLP discussed use of increased vocal intensity and overarticulation to facilitate improved intelligibility in conversations.  Handout was provided to maximize carryover in between therapy sessions.  Therapist then facilitated the session with a novel verbal description task targeting use of strategies.  Pt was intelligible in a quiet environment with mod I use of precautions; however, she needed min assist verbal cues for word finding during moments of anomia within task.  Pt was left in wheelchair with call bell within reach.  Continue per current plan of care.    Function:  Eating Eating   Modified Consistency Diet: No Eating Assist Level: More than reasonable amount of time;Set up assist for   Eating Set Up Assist For: Opening containers       Cognition Comprehension Comprehension assist level: Follows basic conversation/direction with extra time/assistive device  Expression   Expression assist level: Expresses basic 75 - 89% of the time/requires cueing 10 - 24% of the time. Needs helper to occlude trach/needs to repeat words.  Social Interaction Social Interaction assist level: Interacts appropriately with others with medication or extra time (anti-anxiety,  antidepressant).  Problem Solving Problem solving assist level: Solves basic 90% of the time/requires cueing < 10% of the time  Memory Memory assist level: Recognizes or recalls 90% of the time/requires cueing < 10% of the time    Pain Pain Assessment Pain Assessment: No/denies pain  Therapy/Group: Individual Therapy  Derrin Currey, Selinda Orion 09/27/2016, 4:14 PM

## 2016-09-27 NOTE — Progress Notes (Signed)
Speech Language Pathology Daily Session Note  Patient Details  Name: Savannah Travis MRN: [id number] Date of Birth: 01/29/1941  Today's Date: 09/27/2016 SLP Individual Time: [id number] SLP Individual Time Calculation (min): 45 min   Short Term Goals: Week 1: SLP Short Term Goal 1 (Week 1): Pt will utilize overarticulation and increased vocal intensity to achieve intelligibility in conversations in a minimally distracting environment with supervision.  SLP Short Term Goal 2 (Week 1): Pt will utilize compensatory word finding strategies in conversations with supervision verbal cues.   Skilled Therapeutic Interventions:Pt was seen for skilled ST targeting goals for communication and education regarding compensatory strategies for word finding.  Therapist discussed use of description and word substitutions to compensate during moments of anomia.  Pt then utilized the abovementioned strategies during a novel categorical naming task with min assist verbal cues.  Pt was left in bed with bed alarm set and call bell within reach.  Continue per current plan of care.       Function:  Eating Eating                 Cognition Comprehension Comprehension assist level: Follows basic conversation/direction with extra time/assistive device  Expression   Expression assist level: Expresses basic 75 - 89% of the time/requires cueing 10 - 24% of the time. Needs helper to occlude trach/needs to repeat words.  Social Interaction Social Interaction assist level: Interacts appropriately with others with medication or extra time (anti-anxiety, antidepressant).  Problem Solving Problem solving assist level: Solves basic 90% of the time/requires cueing < 10% of the time  Memory Memory assist level: Recognizes or recalls 90% of the time/requires cueing < 10% of the time    Pain Pain Assessment Pain Assessment: No/denies pain  Therapy/Group: Individual Therapy  Itali Mckendry, Selinda Orion 09/27/2016, 11:24 AM

## 2016-09-27 NOTE — Progress Notes (Signed)
Subjective/Complaints: No issues overnite, tolerating therapy Review of systems negative for bowel issues, positive for bladder frequency, negative for shortness of breath, nausea, vomiting, diarrhea.  Objective: Vital Signs: Blood pressure (!) 117/52, pulse 75, temperature 97.9 F (36.6 C), temperature source Oral, resp. rate 18, height 4' 8"  (1.422 m), weight 91.2 kg (201 lb 1 oz), SpO2 96 %. No results found. Results for orders placed or performed during the hospital encounter of 09/25/16 (from the past 72 hour(s))  CBC WITH DIFFERENTIAL     Status: None   Collection Time: 09/26/16 10:18 AM  Result Value Ref Range   WBC 6.2 4.0 - 10.5 K/uL   RBC 4.60 3.87 - 5.11 MIL/uL   Hemoglobin 13.2 12.0 - 15.0 g/dL   HCT 42.0 36.0 - 46.0 %   MCV 91.3 78.0 - 100.0 fL   MCH 28.7 26.0 - 34.0 pg   MCHC 31.4 30.0 - 36.0 g/dL   RDW 14.6 11.5 - 15.5 %   Platelets 317 150 - 400 K/uL   Neutrophils Relative % 62 %   Neutro Abs 3.9 1.7 - 7.7 K/uL   Lymphocytes Relative 28 %   Lymphs Abs 1.7 0.7 - 4.0 K/uL   Monocytes Relative 6 %   Monocytes Absolute 0.4 0.1 - 1.0 K/uL   Eosinophils Relative 4 %   Eosinophils Absolute 0.3 0.0 - 0.7 K/uL   Basophils Relative 0 %   Basophils Absolute 0.0 0.0 - 0.1 K/uL  Comprehensive metabolic panel     Status: Abnormal   Collection Time: 09/26/16 10:18 AM  Result Value Ref Range   Sodium 140 135 - 145 mmol/L   Potassium 4.0 3.5 - 5.1 mmol/L   Chloride 108 101 - 111 mmol/L   CO2 23 22 - 32 mmol/L   Glucose, Bld 117 (H) 65 - 99 mg/dL   BUN 26 (H) 6 - 20 mg/dL   Creatinine, Ser 1.22 (H) 0.44 - 1.00 mg/dL   Calcium 9.0 8.9 - 10.3 mg/dL   Total Protein 7.0 6.5 - 8.1 g/dL   Albumin 3.9 3.5 - 5.0 g/dL   AST 32 15 - 41 U/L   ALT 20 14 - 54 U/L   Alkaline Phosphatase 90 38 - 126 U/L   Total Bilirubin 0.9 0.3 - 1.2 mg/dL   GFR calc non Af Amer 42 (L) >60 mL/min   GFR calc Af Amer 49 (L) >60 mL/min    Comment: (NOTE) The eGFR has been calculated using the CKD  EPI equation. This calculation has not been validated in all clinical situations. eGFR's persistently <60 mL/min signify possible Chronic Kidney Disease.    Anion gap 9 5 - 15     HEENT: normal and +dysarthria Cardio: RRR and No murmurs or extra sounds Resp: CTA B/L and Unlabored GI: BS positive and Nontender, nondistended Extremity:  Pulses positive and No Edema Skin:   Intact Neuro: Alert/Oriented, Cranial Nerve II-XII normal, Normal Sensory, Abnormal Motor Motor strength is 3 minus in the right deltoid, bicep, triceps, grip,4 minus in the right hip flexor, knee extensor, ankle dorsiflexor.4/5 in left deltoid, biceps, triceps, grip, hip flexor, knee extensor, ankle dorsi flexor, Abnormal FMC Ataxic/ dec FMC, Tone:  Within Normal Limits, Dysarthric and Aphasic Musc/Skel:  Other Limited right shoulder range of motion without pain. Gen. no acute distress   Assessment/Plan: 1. Functional deficits secondary to left corona radiata infarct, right hemiparesis, aphasia, dysarthria which require 3+ hours per day of interdisciplinary therapy in a comprehensive inpatient rehab  setting. Physiatrist is providing close team supervision and 24 hour management of active medical problems listed below. Physiatrist and rehab team continue to assess barriers to discharge/monitor patient progress toward functional and medical goals. FIM: Function - Bathing Position: Shower Body parts bathed by patient: Right arm, Left arm, Chest, Abdomen, Front perineal area, Right upper leg, Left upper leg Body parts bathed by helper: Right lower leg, Left lower leg, Buttocks  Function- Upper Body Dressing/Undressing What is the patient wearing?: Pull over shirt/dress Pull over shirt/dress - Perfomed by patient: Thread/unthread right sleeve, Thread/unthread left sleeve, Put head through opening, Pull shirt over trunk Assist Level: Supervision or verbal cues Function - Lower Body Dressing/Undressing What is the patient  wearing?: Underwear, Pants, Non-skid slipper socks, Ted Hose Position: Wheelchair/chair at [employer] - Performed by patient: Thread/unthread left underwear leg Underwear - Performed by helper: Thread/unthread right underwear leg, Pull underwear up/down Pants- Performed by patient: Thread/unthread left pants leg Pants- Performed by helper: Thread/unthread right pants leg, Pull pants up/down Non-skid slipper socks- Performed by helper: Don/doff right sock, Don/doff left sock TED Hose - Performed by helper: Don/doff right TED hose, Don/doff left TED hose  Function - Toileting Toileting steps completed by patient: Adjust clothing prior to toileting, Adjust clothing after toileting Toileting steps completed by helper: Performs perineal hygiene Toileting Assistive Devices: Grab bar or rail Assist level: More than reasonable time, Touching or steadying assistance (Pt.75%)  Function - Air cabin crew transfer assistive device: Bedside commode Assist level to bedside commode (at bedside): Maximal assist (Pt 25 - 49%/lift and lower) Assist level from bedside commode (at bedside): Maximal assist (Pt 25 - 49%/lift and lower)  Function - Chair/bed transfer Chair/bed transfer activity did not occur: N/A Chair/bed transfer method: Stand pivot Chair/bed transfer assist level: Touching or steadying assistance (Pt > 75%) Chair/bed transfer assistive device: Armrests, Walker Chair/bed transfer details: Manual facilitation for weight shifting, Verbal cues for technique, Manual facilitation for placement  Function - Locomotion: Wheelchair Will patient use wheelchair at discharge?: Yes Max wheelchair distance: 50 Assist Level: Moderate assistance (Pt 50 - 74%) Assist Level: Moderate assistance (Pt 50 - 74%) Function - Locomotion: Ambulation Assistive device: Walker-rolling Max distance: 55 Assist level: Touching or steadying assistance (Pt > 75%) Assist level: Touching or steadying  assistance (Pt > 75%) Walk 50 feet with 2 turns activity did not occur: Safety/medical concerns (L knee painful, R knee weak) Assist level: Touching or steadying assistance (Pt > 75%) Walk 10 feet on uneven surfaces activity did not occur: Safety/medical concerns (L knee painful, R knee weak) Assist level: Moderate assist (Pt 50 - 74%)  Function - Comprehension Comprehension: Auditory Comprehension assist level: Follows basic conversation/direction with extra time/assistive device  Function - Expression Expression: Verbal Expression assist level: Expresses basic 75 - 89% of the time/requires cueing 10 - 24% of the time. Needs helper to occlude trach/needs to repeat words.  Function - Social Interaction Social Interaction assist level: Interacts appropriately with others with medication or extra time (anti-anxiety, antidepressant).  Function - Problem Solving Problem solving assist level: Solves basic 90% of the time/requires cueing < 10% of the time  Function - Memory Memory assist level: Recognizes or recalls 90% of the time/requires cueing < 10% of the time Patient normally able to recall (first 3 days only): Staff names and faces, That he or she is in a hospital, Current season   Medical Problem List and Plan: 1.Decreased functional mobility with lower extremity weakness/altered mental status/question syncopesecondary to left  corona radiata infarct -CIR PTOT speech , ELOS ~2wk 2. DVT Prophylaxis/Anticoagulation: SCDs. Monitor for any signs of DVT 3. Pain Management/headaches: Topamax 25 mg twice a day 4. Mood: Ludiomil 50 mg daily at bedtime.Klonopin 1 mg daily at bedtime---pt would like this scheduled 5. Neuropsych: This patient iscapable of making decisions on herown behalf. 6. Skin/Wound Care: Routine skin checks 7. Fluids/Electrolytes/Nutrition: Routine I&O with follow-up chemistries upon admit. 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  Continue Trileptal 300 mg twice a day           -recent EEG negative, no evidence of seizure activity since rehabilitation admission 9.Hypertension. Norvasc 5 mg daily. Monitor with increased mobility 10.Klebsiella UTI. Ceftin initiated 09/24/2016 transitioning from Rocephin. We will assess bladder emptying as well. May have spastic neurogenic bladder in addition to UTI. Hold off on anticholinergic agents until UTI treated, PVR 36m and 2547m11.Hyperlipidemia. Lipitor 12.History of gout. Allopurinol 300 mg daily. Monitor for any gout flareups. No pain at present    13.  Pre renal azotemia, start nocturnal IVF LOS (Days) 2 A FACE TO FACE EVALUATION WAS PERFORMED  KIRSTEINS,ANDREW E 09/27/2016, 8:24 AM

## 2016-09-28 ENCOUNTER — Inpatient Hospital Stay (HOSPITAL_COMMUNITY): Payer: Medicare Other | Admitting: Speech Pathology

## 2016-09-28 ENCOUNTER — Inpatient Hospital Stay (HOSPITAL_COMMUNITY): Payer: Medicare Other | Admitting: Occupational Therapy

## 2016-09-28 ENCOUNTER — Inpatient Hospital Stay (HOSPITAL_COMMUNITY): Payer: Medicare Other | Admitting: Physical Therapy

## 2016-09-28 DIAGNOSIS — R269 Unspecified abnormalities of gait and mobility: Secondary | ICD-10-CM

## 2016-09-28 DIAGNOSIS — G8191 Hemiplegia, unspecified affecting right dominant side: Secondary | ICD-10-CM

## 2016-09-28 DIAGNOSIS — I69398 Other sequelae of cerebral infarction: Secondary | ICD-10-CM

## 2016-09-28 NOTE — Progress Notes (Signed)
Physical Therapy Session Note  Patient Details  Name: Savannah Travis MRN: [id number] Date of Birth: Jun 14, 1941  Today's Date: 09/28/2016 PT Individual Time: 0800-0900 PT Individual Time Calculation (min): 60 min    Short Term Goals: Week 1:  PT Short Term Goal 1 (Week 1): pt will move supine> sitting with min assist PT Short Term Goal 2 (Week 1): pt will transfer bed>< w/c with mod assist PT Short Term Goal 3 (Week 1): pt will propel w/c x 100' with min assist for strengthening PT Short Term Goal 4 (Week 1): pt will perform gait x 50' with mod assist  Skilled Therapeutic Interventions/Progress Updates:     Pt received supine in bed and agreeable to PT. Supine>sit transfer with min assist and moderate cues for proper UE placement and sequencing for reciprocal scooting to EOB.   Sit>stand from EOB with min assist. Gait to bathroom with min assist and RW. Toilet transfer with min assist from PT for safety with patient to manage clothing.   Standing balance to wash hands without UE support min-supervision assist from PT with cues for improved anterior weight shifting.   Dressing upper body and lower body with sit<>stand to manage clothing with min assist from PT and min cues for improved use of R UE.   Patient transported to rehab gym in [facility] for time management.   Gait in therapy gym 45ft with min assist overall with mod assist x 1 in turn. Cues for improved step length and height on the R LE to prevent foot drag.   Blocked practice sit<>stand from various heights with mod assist progressing to min assist. Cues for improved UE placement and anterior weight shift.   Patient returned to room in [facility] and left sitting in Call [facility] in reach.       Therapy Documentation Precautions:  Precautions Precautions: Fall Restrictions Weight Bearing Restrictions: No Pain: 0/10   See Function Navigator for Current Functional Status.   Therapy/Group: Individual Therapy  Lorie Phenix 09/28/2016, 10:01 AM

## 2016-09-28 NOTE — Progress Notes (Signed)
Subjective/Complaints: Patient complains that the IV catheter kept her up at night  frequency, negative for shortness of breath, nausea, vomiting, diarrhea.  Objective: Vital Signs: Blood pressure (!) 120/52, pulse 82, temperature 97.4 F (36.3 C), temperature source Oral, resp. rate 16, height 4' 8"  (1.422 m), weight 92.2 kg (203 lb 4.2 oz), SpO2 94 %. No results found. Results for orders placed or performed during the hospital encounter of 09/25/16 (from the past 72 hour(s))  CBC WITH DIFFERENTIAL     Status: None   Collection Time: 09/26/16 10:18 AM  Result Value Ref Range   WBC 6.2 4.0 - 10.5 K/uL   RBC 4.60 3.87 - 5.11 MIL/uL   Hemoglobin 13.2 12.0 - 15.0 g/dL   HCT 42.0 36.0 - 46.0 %   MCV 91.3 78.0 - 100.0 fL   MCH 28.7 26.0 - 34.0 pg   MCHC 31.4 30.0 - 36.0 g/dL   RDW 14.6 11.5 - 15.5 %   Platelets 317 150 - 400 K/uL   Neutrophils Relative % 62 %   Neutro Abs 3.9 1.7 - 7.7 K/uL   Lymphocytes Relative 28 %   Lymphs Abs 1.7 0.7 - 4.0 K/uL   Monocytes Relative 6 %   Monocytes Absolute 0.4 0.1 - 1.0 K/uL   Eosinophils Relative 4 %   Eosinophils Absolute 0.3 0.0 - 0.7 K/uL   Basophils Relative 0 %   Basophils Absolute 0.0 0.0 - 0.1 K/uL  Comprehensive metabolic panel     Status: Abnormal   Collection Time: 09/26/16 10:18 AM  Result Value Ref Range   Sodium 140 135 - 145 mmol/L   Potassium 4.0 3.5 - 5.1 mmol/L   Chloride 108 101 - 111 mmol/L   CO2 23 22 - 32 mmol/L   Glucose, Bld 117 (H) 65 - 99 mg/dL   BUN 26 (H) 6 - 20 mg/dL   Creatinine, Ser 1.22 (H) 0.44 - 1.00 mg/dL   Calcium 9.0 8.9 - 10.3 mg/dL   Total Protein 7.0 6.5 - 8.1 g/dL   Albumin 3.9 3.5 - 5.0 g/dL   AST 32 15 - 41 U/L   ALT 20 14 - 54 U/L   Alkaline Phosphatase 90 38 - 126 U/L   Total Bilirubin 0.9 0.3 - 1.2 mg/dL   GFR calc non Af Amer 42 (L) >60 mL/min   GFR calc Af Amer 49 (L) >60 mL/min    Comment: (NOTE) The eGFR has been calculated using the CKD EPI equation. This calculation has not  been validated in all clinical situations. eGFR's persistently <60 mL/min signify possible Chronic Kidney Disease.    Anion gap 9 5 - 15     HEENT: normal and +dysarthria Cardio: RRR and No murmurs or extra sounds Resp: CTA B/L and Unlabored GI: BS positive and Nontender, nondistended Extremity:  Pulses positive and No Edema Skin:   Intact Neuro: Alert/Oriented, Cranial Nerve II-XII normal, Normal Sensory, Abnormal Motor Motor strength is 3 minus in the right deltoid, bicep, triceps, grip,4 minus in the right hip flexor, knee extensor, ankle dorsiflexor.4/5 in left deltoid, biceps, triceps, grip, hip flexor, knee extensor, ankle dorsi flexor, Abnormal FMC Ataxic/ dec FMC, Tone:  Within Normal Limits, Dysarthric and Aphasic Musc/Skel:  Other Limited right shoulder range of motion without pain. Gen. no acute distress   Assessment/Plan: 1. Functional deficits secondary to left corona radiata infarct, right hemiparesis, aphasia, dysarthria which require 3+ hours per day of interdisciplinary therapy in a comprehensive inpatient rehab setting. Physiatrist is  providing close team supervision and 24 hour management of active medical problems listed below. Physiatrist and rehab team continue to assess barriers to discharge/monitor patient progress toward functional and medical goals. FIM: Function - Bathing Position: Shower Body parts bathed by patient: Right arm, Left arm, Chest, Abdomen, Front perineal area, Right upper leg, Left upper leg Body parts bathed by helper: Right lower leg, Left lower leg, Buttocks  Function- Upper Body Dressing/Undressing What is the patient wearing?: Pull over shirt/dress Pull over shirt/dress - Perfomed by patient: Thread/unthread right sleeve, Thread/unthread left sleeve, Put head through opening, Pull shirt over trunk Assist Level: Supervision or verbal cues Function - Lower Body Dressing/Undressing What is the patient wearing?: Underwear, Pants, Non-skid  slipper socks, Ted Hose Position: Wheelchair/chair at [employer] - Performed by patient: Thread/unthread left underwear leg Underwear - Performed by helper: Thread/unthread right underwear leg, Pull underwear up/down Pants- Performed by patient: Thread/unthread left pants leg Pants- Performed by helper: Thread/unthread right pants leg, Pull pants up/down Non-skid slipper socks- Performed by helper: Don/doff right sock, Don/doff left sock TED Hose - Performed by helper: Don/doff right TED hose, Don/doff left TED hose  Function - Toileting Toileting steps completed by patient: Adjust clothing prior to toileting, Adjust clothing after toileting, Performs perineal hygiene Toileting steps completed by helper: Performs perineal hygiene Toileting Assistive Devices: Grab bar or rail Assist level: Touching or steadying assistance (Pt.75%)  Function - Air cabin crew transfer assistive device: Bedside commode Assist level to toilet: Moderate assist (Pt 50 - 74%/lift or lower) Assist level from toilet: Moderate assist (Pt 50 - 74%/lift or lower) Assist level to bedside commode (at bedside): Maximal assist (Pt 25 - 49%/lift and lower) Assist level from bedside commode (at bedside): Maximal assist (Pt 25 - 49%/lift and lower)  Function - Chair/bed transfer Chair/bed transfer activity did not occur: N/A Chair/bed transfer method: Stand pivot Chair/bed transfer assist level: Moderate assist (Pt 50 - 74%/lift or lower) Chair/bed transfer assistive device: Walker, Armrests Chair/bed transfer details: Manual facilitation for weight shifting, Verbal cues for technique, Manual facilitation for placement  Function - Locomotion: Wheelchair Will patient use wheelchair at discharge?: Yes Type: Manual Max wheelchair distance: 29f  Assist Level: Moderate assistance (Pt 50 - 74%) Assist Level: Moderate assistance (Pt 50 - 74%) Function - Locomotion: Ambulation Assistive device:  Walker-rolling Max distance: 55 Assist level: Touching or steadying assistance (Pt > 75%) Assist level: Touching or steadying assistance (Pt > 75%) Walk 50 feet with 2 turns activity did not occur: Safety/medical concerns (L knee painful, R knee weak) Assist level: Touching or steadying assistance (Pt > 75%) Walk 10 feet on uneven surfaces activity did not occur: Safety/medical concerns (L knee painful, R knee weak) Assist level: Moderate assist (Pt 50 - 74%)  Function - Comprehension Comprehension: Auditory Comprehension assist level: Follows basic conversation/direction with extra time/assistive device  Function - Expression Expression: Verbal Expression assist level: Expresses basic 75 - 89% of the time/requires cueing 10 - 24% of the time. Needs helper to occlude trach/needs to repeat words.  Function - Social Interaction Social Interaction assist level: Interacts appropriately with others with medication or extra time (anti-anxiety, antidepressant).  Function - Problem Solving Problem solving assist level: Solves basic 90% of the time/requires cueing < 10% of the time  Function - Memory Memory assist level: Recognizes or recalls 90% of the time/requires cueing < 10% of the time Patient normally able to recall (first 3 days only): Staff names and faces, That he or she is  in a hospital, Current season   Medical Problem List and Plan: 1.Decreased functional mobility with lower extremity weakness/altered mental status/question syncopesecondary to left corona radiata infarct -CIR PTOT speech , ELOS ~2wk 2. DVT Prophylaxis/Anticoagulation: SCDs. Monitor for any signs of DVT 3. Pain Management/headaches: Topamax 25 mg twice a day 4. Mood: Ludiomil 50 mg daily at bedtime.Klonopin 1 mg daily at bedtime---pt would like this scheduled 5. Neuropsych: This patient iscapable of making decisions on herown behalf. 6. Skin/Wound Care: Routine skin checks 7.  Fluids/Electrolytes/Nutrition: Routine I&O with follow-up chemistries upon admit. Encourage PO, 1000 mL fluid intake, 85-100% meal intake. 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 Continue Trileptal 300 mg twice a day           -recent EEG negative, no evidence of seizure activity since rehabilitation admission 9.Hypertension. Norvasc 5 mg daily. Monitor with increased mobility 10.Klebsiella UTI. Ceftin initiated 09/24/2016 transitioning from Rocephin. May have spastic neurogenic bladder in addition to UTI. Hold off on anticholinergic agents until UTI treated, PVR 0-30 cc 11.Hyperlipidemia. Lipitor 12.History of gout. Allopurinol 300 mg daily. Monitor for any gout flareups. No pain at present    13.  Pre renal azotemia, continue nocturnal IVF, repeat bmet in a.m. LOS (Days) 3 A FACE TO FACE EVALUATION WAS PERFORMED  Elverna Caffee E 09/28/2016, 8:37 AM

## 2016-09-28 NOTE — Progress Notes (Signed)
Speech Language Pathology Daily Session Note  Patient Details  Name: Savannah Travis MRN: [id number] Date of Birth: 1941-09-14  Today's Date: 09/28/2016 SLP Individual Time: 0905-1000 SLP Individual Time Calculation (min): 55 min   Short Term Goals: Week 1: SLP Short Term Goal 1 (Week 1): Pt will utilize overarticulation and increased vocal intensity to achieve intelligibility in conversations in a minimally distracting environment with supervision.  SLP Short Term Goal 2 (Week 1): Pt will utilize compensatory word finding strategies in conversations with supervision verbal cues.   Skilled Therapeutic Interventions:  Pt was seen for skilled ST targeting communication goals.  In a moderately distracting environment, pt needed up to min assist verbal cues for slow rate, increased vocal intensity, and overarticulation to achieve intelligibility during conversations with SLP.  Therapist reviewed and reinforced compensatory strategies for speech intelligibility from yesterday's therapy session as pt needed min assist verbal cues to recall them.  Pt then utilized intelligibility strategies during a picture description task with the use of a visual barrier in a minimally distracting environment with overall supervision verbal cues.  Pt needed intermittent min assist verbal cues for semi-complex abstract word finding during loosely structured conversations.  Pt was returned to room and left in wheelchair with call bell within reach.  Continue per current plan of care.    Function:  Eating Eating   Modified Consistency Diet: No Eating Assist Level: More than reasonable amount of time;Set up assist for   Eating Set Up Assist For: Opening containers;Cutting food;Parenteral or tube feed supplies       Cognition Comprehension Comprehension assist level: Follows basic conversation/direction with extra time/assistive device  Expression   Expression assist level: Expresses basic needs/ideas: With extra  time/assistive device  Social Interaction Social Interaction assist level: Interacts appropriately with others - No medications needed.  Problem Solving Problem solving assist level: Solves basic 90% of the time/requires cueing < 10% of the time  Memory Memory assist level: Recognizes or recalls 90% of the time/requires cueing < 10% of the time    Pain Pain Assessment Pain Assessment: No/denies pain  Therapy/Group: Individual Therapy  Robt Okuda, Selinda Orion 09/28/2016, 12:25 PM

## 2016-09-28 NOTE — IPOC Note (Signed)
Overall Plan of Care [facility]) Patient Details Name: Savannah Travis MRN: [id number] DOB: 1941/07/16  Admitting Diagnosis: CVA  Hospital Problems: Active Problems:   Cerebrovascular accident (CVA) due to thrombosis of left middle cerebral artery (HCC)   Right hemiparesis ([place])   Gait disturbance, post-stroke     Functional Problem List: Nursing Bladder, Edema, Endurance, Medication Management, Nutrition, Pain, Perception, Safety, Skin Integrity  PT Balance, Endurance, Motor, Pain, Safety  OT Balance, Motor, Endurance, Safety  SLP Linguistic  TR         Basic ADL's: OT Eating, Grooming, Bathing, Dressing, Toileting     Advanced  ADL's: OT Simple Meal Preparation, Light Housekeeping, Laundry     Transfers: PT Bed Mobility, Bed to Chair, Musician, [employer]  OT [employer], Agricultural engineer: PT Ambulation, Emergency planning/management officer, Stairs     Additional Impairments: OT Fuctional Use of Upper Extremity  SLP Communication expression    TR      Anticipated Outcomes Item Anticipated Outcome  Self Feeding modified independence  Swallowing      Basic self-care  supervision level  Toileting  supervision level   Bathroom Transfers supervision level   Bowel/Bladder  min assist with bladder  Transfers  supervision basic and car  Locomotion  supervision gait x 150' controlled , 200' community, and 50' home; supervision up/down ramp with LRAD and min assist up/down 12 steps wiht 2 rails; supervison w/c x 150' controlled and 50' home setting  Communication  mod I   Cognition     Pain  <3 on a 0-10 scale  Safety/Judgment  min-mod assist   Therapy Plan: PT Intensity: Minimum of 1-2 x/day ,45 to 90 minutes PT Frequency: 5 out of 7 days PT Duration Estimated Length of Stay: 14-17 OT Intensity: Minimum of 1-2 x/day, 45 to 90 minutes OT Frequency: 5 out of 7 days OT Duration/Estimated Length of Stay: 14-16 days SLP Intensity: Minumum of 1-2 x/day, 30 to 90 minutes SLP  Frequency: 3 to 5 out of 7 days SLP Duration/Estimated Length of Stay: 10-14 days        Team Interventions: Nursing Interventions Patient/Family Education, Bladder Management, Disease Management/Prevention, Pain Management, Medication Management, Skin Care/Wound Management, Psychosocial Support, Discharge Planning  PT interventions Ambulation/gait training, Balance/vestibular training, Cognitive remediation/compensation, Community reintegration, Discharge planning, Functional mobility training, DME/adaptive equipment instruction, Neuromuscular re-education, Pain management, Patient/family education, Splinting/orthotics, Psychosocial support, Stair training, Therapeutic Activities, Therapeutic Exercise, UE/LE Coordination activities, UE/LE Strength taining/ROM, Wheelchair propulsion/positioning  OT Interventions Training and development officer, Discharge planning, Self Care/advanced ADL retraining, Therapeutic Activities, UE/LE Coordination activities, Therapeutic Exercise, Patient/family education, Functional mobility training, Disease mangement/prevention, Cognitive remediation/compensation, DME/adaptive equipment instruction, Neuromuscular re-education, Psychosocial support, Splinting/orthotics, UE/LE Strength taining/ROM  SLP Interventions Cognitive remediation/compensation, Cueing hierarchy, Functional tasks, Patient/family education, Internal/external aids  TR Interventions    SW/CM Interventions Discharge Planning, Psychosocial Support, Patient/Family Education    Team Discharge Planning: Destination: PT-Home ,OT- Home , SLP-Home Projected Follow-up: PT-Home health PT, OT-  Home health OT, SLP-Home Health SLP, Outpatient SLP, 24 hour supervision/assistance Projected Equipment Needs: PT-To be determined, OT- None recommended by OT, SLP-None recommended by SLP Equipment Details: PT-pt owns a w/c, SPC and a rolling walker ( 2wheels vs 4 wheels?), OT-  Patient/family involved in discharge planning:  PT- Patient,  OT-Patient, SLP-Patient  MD ELOS: 7-10d Medical Rehab Prognosis:  Good Assessment: 76 y.o.right handed femalewith history of Depression,breast cancer, migraine headaches and her chart review patient lives with spouse. Independent with a walker for short distances. One level  home with ramped entrance. She has had some recent falls.. Presented 09/20/2016 with episode of unresponsiveness while riding in a car with her family and intermittent headaches.She began to respond after 10-15 minutes. Family had reported 3 such events since 09/17/2016 as well as complaints of progressive weakness and decrease in mobility. 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 MRA with focal high-grade left M1 segment stenosis elsewhere negative intracranial MRA. Patient did not receive TPA. Echocardiogram with ejection fraction of 123456 grade 1 diastolic dysfunction.EEG negative. A carotid Doppler 08/08/2016 was negative.Neurology consulted and maintained on aspirin and Plavix for CVA prophylaxis 3 months then Plavix alone    Now requiring 24/7 Rehab RN,MD, as well as CIR level PT, OT and SLP.  Treatment team will focus on ADLs and mobility with goals set at Sup  See Team Conference Notes for weekly updates to the plan of care

## 2016-09-28 NOTE — Progress Notes (Signed)
Occupational Therapy Session Note  Patient Details  Name: Savannah Travis MRN: [id number] Date of Birth: Sep 06, 1941  Today's Date: 09/28/2016 OT Individual Time: 1047-1200 OT Individual Time Calculation (min): 73 min     Short Term Goals:Week 1:  OT Short Term Goal 1 (Week 1): Pt will completed LB bathing with min assist sit to stand.  OT Short Term Goal 2 (Week 1): Pt will complete LB dressing sit to stand with min assist.  OT Short Term Goal 3 (Week 1): Pt will complete stand pivot toilet transfers with min asisst using the RW.  OT Short Term Goal 4 (Week 1): Pt will complete walk-in shower transfers with min assist using the RW.  OT Short Term Goal 5 (Week 1): Pt will use the RUE to comb her hair with supervision.   Skilled Therapeutic Interventions/Progress Updates:    Pt seen for ADL retraining with a focus on balance and RUE motor control. "I am very determined to heal and I would like to use my R hand to do everything even if it takes me awhile." Pt's determination served her well as she did a great job using her R hand as a dominant assist with extra time needed. She used it to wash hair, comb hair, wash under arm, holding reacher and assist with pulling pants.  Pt was able to sit to stand in shower and from w/c with touching A and slight steadying A to maintain balance.  Pt used reacher to don clothing over feet with extra time, and was able to pull pants over hips 80% of the way.  Pt will need extra practice with the reacher and will benefit from a long sponge. Pt resting in w/c with all needs met.  Therapy Documentation Precautions:  Precautions Precautions: Fall Restrictions Weight Bearing Restrictions: No  Pain: no c/o pain   ADL:    See Function Navigator for Current Functional Status.   Therapy/Group: Individual Therapy  Hasheem Voland 09/28/2016, 12:18 PM

## 2016-09-29 ENCOUNTER — Inpatient Hospital Stay (HOSPITAL_COMMUNITY): Payer: Medicare Other | Admitting: Speech Pathology

## 2016-09-29 ENCOUNTER — Other Ambulatory Visit: Payer: Self-pay

## 2016-09-29 ENCOUNTER — Inpatient Hospital Stay (HOSPITAL_COMMUNITY): Payer: Medicare Other

## 2016-09-29 ENCOUNTER — Inpatient Hospital Stay (HOSPITAL_COMMUNITY): Payer: Medicare Other | Admitting: Physical Therapy

## 2016-09-29 DIAGNOSIS — I69398 Other sequelae of cerebral infarction: Secondary | ICD-10-CM

## 2016-09-29 DIAGNOSIS — R269 Unspecified abnormalities of gait and mobility: Secondary | ICD-10-CM

## 2016-09-29 LAB — BASIC METABOLIC PANEL
ANION GAP: 9 (ref 5–15)
BUN: 14 mg/dL (ref 6–20)
CALCIUM: 9.1 mg/dL (ref 8.9–10.3)
CO2: 21 mmol/L — ABNORMAL LOW (ref 22–32)
CREATININE: 0.93 mg/dL (ref 0.44–1.00)
Chloride: 109 mmol/L (ref 101–111)
GFR calc non Af Amer: 59 mL/min — ABNORMAL LOW (ref >60)
GLUCOSE: 128 mg/dL — AB (ref 65–99)
Potassium: 3.4 mmol/L — ABNORMAL LOW (ref 3.5–5.1)
Sodium: 139 mmol/L (ref 135–145)

## 2016-09-29 NOTE — Progress Notes (Signed)
Speech Language Pathology Daily Session Note  Patient Details  Name: Savannah Travis MRN: [id number] Date of Birth: [birth year]  Today's Date: 09/29/2016 SLP Individual Time: 45-1400 SLP Individual Time Calculation (min): 27 min   Short Term Goals: Week 1: SLP Short Term Goal 1 (Week 1): Pt will utilize overarticulation and increased vocal intensity to achieve intelligibility in conversations in a minimally distracting environment with supervision.  SLP Short Term Goal 2 (Week 1): Pt will utilize compensatory word finding strategies in conversations with supervision verbal cues.   Skilled Therapeutic Interventions:  Pt was seen for skilled ST targeting communication goals.  Pt appeared tearful upon therapist's arrival and recounted a "spell" that she had this morning during therapy.  Pt endorses long history of such spells and that they can debilitate pt for an entire day.  Pt agreeable to participate in therapy despite this.  Pt's speech slightly more dysarthric today in comparison to previous therapy sessions.  Suspect fatigue impacting function.  Pt needed supervision verbal cues for overarticulation, slow rate, and increased vocal intensity during oral reading tasks to achieve intelligibility at the paragraph level.  Pt was left in wheelchair with call bell within reach.  Continue per current plan of care.    Function:  Eating Eating                 Cognition Comprehension Comprehension assist level: Follows basic conversation/direction with extra time/assistive device  Expression   Expression assist level: Expresses basic 90% of the time/requires cueing < 10% of the time.  Social Interaction Social Interaction assist level: Interacts appropriately 90% of the time - Needs monitoring or encouragement for participation or interaction.  Problem Solving Problem solving assist level: Solves basic 90% of the time/requires cueing < 10% of the time  Memory Memory assist level: Recognizes or  recalls 90% of the time/requires cueing < 10% of the time    Pain Pain Assessment Pain Assessment: No/denies pain  Therapy/Group: Individual Therapy  Jamond Neels, Selinda Orion 09/29/2016, 3:41 PM

## 2016-09-29 NOTE — Progress Notes (Addendum)
Subjective/Complaints: Head episode of decreased level of alertness while toileting with nursing this morning. She had some left-sided chest pain. This resolved after a couple minutes. EKG was ordered, and this did not show any changes compared to prior tracings, normal sinus rhythm and rate. Orthostatic vitals obtained, nursing did not stand the patient, but was able to go from supine to sit without any drop in blood pressure. Patient has subsequently been able to participate with OT. She is bathing currently. She gives a long history of syncope. I reviewed notes from neurology, as well as cardiology. Cardiology workup performed recently at [facility] was negative  frequency, negative for shortness of breath, nausea, vomiting, diarrhea. Neurology suspected complex partial seizures as a potential etiology  Objective: Vital Signs: Blood pressure (!) 148/66, pulse 84, temperature 97.5 F (36.4 C), temperature source Oral, resp. rate 18, height 4' 8"  (1.422 m), weight 92.2 kg (203 lb 4.2 oz), SpO2 98 %. No results found. Results for orders placed or performed during the hospital encounter of 09/25/16 (from the past 72 hour(s))  Basic metabolic panel     Status: Abnormal   Collection Time: 09/29/16  8:39 AM  Result Value Ref Range   Sodium 139 135 - 145 mmol/L   Potassium 3.4 (L) 3.5 - 5.1 mmol/L   Chloride 109 101 - 111 mmol/L   CO2 21 (L) 22 - 32 mmol/L   Glucose, Bld 128 (H) 65 - 99 mg/dL   BUN 14 6 - 20 mg/dL   Creatinine, Ser 0.93 0.44 - 1.00 mg/dL   Calcium 9.1 8.9 - 10.3 mg/dL   GFR calc non Af Amer 59 (L) >60 mL/min   GFR calc Af Amer >60 >60 mL/min    Comment: (NOTE) The eGFR has been calculated using the CKD EPI equation. This calculation has not been validated in all clinical situations. eGFR's persistently <60 mL/min signify possible Chronic Kidney Disease.    Anion gap 9 5 - 15     HEENT: normal and +dysarthria Cardio: RRR and No murmurs or extra sounds,  Tenderness to palpation along the costochondral  junction on the left side Resp: CTA B/L and Unlabored GI: BS positive and Nontender, nondistended Extremity:  Pulses positive and No Edema Skin:   Intact Neuro: Alert/Oriented, Cranial Nerve II-XII normal, Normal Sensory, Abnormal Motor Motor strength is 3 minus in the right deltoid, bicep, triceps, grip,4 minus in the right hip flexor, knee extensor, ankle dorsiflexor.4/5 in left deltoid, biceps, triceps, grip, hip flexor, knee extensor, ankle dorsi flexor, Abnormal FMC Ataxic/ dec FMC, Tone:  Within Normal Limits, Dysarthric and Aphasic Musc/Skel:  Other Limited right shoulder range of motion without pain. Gen. no acute distress   Assessment/Plan: 1. Functional deficits secondary to left corona radiata infarct, right hemiparesis, aphasia, dysarthria which require 3+ hours per day of interdisciplinary therapy in a comprehensive inpatient rehab setting. Physiatrist is providing close team supervision and 24 hour management of active medical problems listed below. Physiatrist and rehab team continue to assess barriers to discharge/monitor patient progress toward functional and medical goals. FIM: Function - Bathing Position: Shower Body parts bathed by patient: Right arm, Left arm, Chest, Abdomen, Front perineal area, Right upper leg, Left upper leg, Buttocks Body parts bathed by helper: Right lower leg, Left lower leg, Back  Function- Upper Body Dressing/Undressing What is the patient wearing?: Pull over shirt/dress, Bra Bra - Perfomed by patient: Thread/unthread right bra strap, Thread/unthread left bra strap Bra - Perfomed by helper: Hook/unhook bra (  pull down sports bra) Pull over shirt/dress - Perfomed by patient: Thread/unthread right sleeve, Thread/unthread left sleeve, Put head through opening, Pull shirt over trunk Assist Level: Supervision or verbal cues Function - Lower Body Dressing/Undressing What is the patient wearing?:  Underwear, Pants, Maryln Manuel, Shoes Position: Education officer, museum at [employer] - Performed by patient: Thread/unthread left underwear leg, Thread/unthread right underwear leg Underwear - Performed by helper: Pull underwear up/down Pants- Performed by patient: Thread/unthread right pants leg, Thread/unthread left pants leg Pants- Performed by helper: Pull pants up/down Non-skid slipper socks- Performed by helper: Don/doff right sock, Don/doff left sock Shoes - Performed by helper: Don/doff right shoe, Don/doff left shoe TED Hose - Performed by helper: Don/doff right TED hose, Don/doff left TED hose Assist for footwear: Dependant Assist for lower body dressing:  (2/8 completed by patient, 25%, max assist)  Function - Toileting Toileting steps completed by patient: Adjust clothing prior to toileting, Adjust clothing after toileting Toileting steps completed by helper: Performs perineal hygiene Toileting Assistive Devices: Grab bar or rail Assist level: Touching or steadying assistance (Pt.75%)  Function - Air cabin crew transfer assistive device: Bedside commode Assist level to toilet: Moderate assist (Pt 50 - 74%/lift or lower) Assist level from toilet: Moderate assist (Pt 50 - 74%/lift or lower) Assist level to bedside commode (at bedside): Moderate assist (Pt 50 - 74%/lift or lower) (per Benjamin Stain, NT) Assist level from bedside commode (at bedside): Moderate assist (Pt 50 - 74%/lift or lower)  Function - Chair/bed transfer Chair/bed transfer activity did not occur: N/A Chair/bed transfer method: Stand pivot Chair/bed transfer assist level: Moderate assist (Pt 50 - 74%/lift or lower) Chair/bed transfer assistive device: Walker, Armrests Chair/bed transfer details: Manual facilitation for weight shifting, Verbal cues for technique, Manual facilitation for placement  Function - Locomotion: Wheelchair Will patient use wheelchair at discharge?: Yes Type: Manual Max  wheelchair distance: 62f  Assist Level: Moderate assistance (Pt 50 - 74%) Assist Level: Moderate assistance (Pt 50 - 74%) Function - Locomotion: Ambulation Assistive device: Walker-rolling Max distance: 55 Assist level: Touching or steadying assistance (Pt > 75%) Assist level: Touching or steadying assistance (Pt > 75%) Walk 50 feet with 2 turns activity did not occur: Safety/medical concerns (L knee painful, R knee weak) Assist level: Touching or steadying assistance (Pt > 75%) Walk 10 feet on uneven surfaces activity did not occur: Safety/medical concerns (L knee painful, R knee weak) Assist level: Moderate assist (Pt 50 - 74%)  Function - Comprehension Comprehension: Auditory Comprehension assist level: Follows basic conversation/direction with extra time/assistive device  Function - Expression Expression: Verbal Expression assist level: Expresses basic needs/ideas: With extra time/assistive device  Function - Social Interaction Social Interaction assist level: Interacts appropriately with others - No medications needed.  Function - Problem Solving Problem solving assist level: Solves basic 90% of the time/requires cueing < 10% of the time  Function - Memory Memory assist level: Recognizes or recalls 90% of the time/requires cueing < 10% of the time Patient normally able to recall (first 3 days only): That he or she is in a hospital   Medical Problem List and Plan: 1.Decreased functional mobility with lower extremity weakness/altered mental status/question syncopesecondary to left corona radiata infarct -CIR PTOT speech , ELOS ~2wk 2. DVT Prophylaxis/Anticoagulation: SCDs. Monitor for any signs of DVT 3. Pain Management/headaches: Topamax 25 mg twice a day, chest pain, musculoskeletal chest wall, sports cream 4. Mood: Ludiomil 50 mg daily at bedtime.Klonopin 1 mg daily at bedtime---pt would like this scheduled  5. Neuropsych: This patient iscapable of  making decisions on herown behalf. 6. Skin/Wound Care: Routine skin checks 7. Fluids/Electrolytes/Nutrition: Routine I&O with follow-up chemistries upon admit. Encourage PO, 1000 mL fluid intake, 85-100% meal intake. 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 Continue Trileptal 300 mg twice a day           -recent EEG negative, will repeat if this occurs again as suggested by Neurohospitalist. Ask neurology weather increase Trileptal dose is recommended- Did not rec unless recurrent 9.Hypertension. Norvasc 5 mg daily. Monitor with increased mobility 10.Klebsiella UTI. Ceftin initiated 09/24/2016 transitioning from Rocephin. May have spastic neurogenic bladder in addition to UTI. Hold off on anticholinergic agents until UTI treated, PVR 0-30 cc 11.Hyperlipidemia. Lipitor 12.History of gout. Allopurinol 300 mg daily. Monitor for any gout flareups. No pain at present    13.  Pre renal azotemia, continue nocturnal IVF, repeat bmet in a.m. LOS (Days) 4 A FACE TO FACE EVALUATION WAS PERFORMED  Celestina Gironda E 09/29/2016, 11:36 AM

## 2016-09-29 NOTE — Progress Notes (Signed)
Occupational Therapy Session Note  Patient Details  Name: Savannah Travis MRN: [id number] Date of Birth: 1941-09-07  Today's Date: 09/29/2016 OT Individual Time: [id number] OT Individual Time Calculation (min): 61 min   Short Term Goals: Week 1:  OT Short Term Goal 1 (Week 1): Pt will completed LB bathing with min assist sit to stand.  OT Short Term Goal 2 (Week 1): Pt will complete LB dressing sit to stand with min assist.  OT Short Term Goal 3 (Week 1): Pt will complete stand pivot toilet transfers with min asisst using the RW.  OT Short Term Goal 4 (Week 1): Pt will complete walk-in shower transfers with min assist using the RW.  OT Short Term Goal 5 (Week 1): Pt will use the RUE to comb her hair with supervision.   Skilled Therapeutic Interventions/Progress Updates:   ADL-retraining seated at sink side with focus on transfers, sequencing, improved safety awareness, sit<>stand and dynamic standing balance.   Pt received in bed and requiring extra time to orient to and attend to self-care d/t report of new pain at left upper chest.  With setup to max vc, pt was able to rise to sit at EOB, complete stand pivot transfer to [facility] and toilet with overall min assist and extra time.   After completing additional stand-pivot transfer to w/c, pt was placed at sink for setup with bathing and dressing.   Per pt, she typically bathes every other day in her shower and takes pan baths at sink on alternate days with her husband assist with dressing d/t her chronic arthritis.   Pt required extra time and rest breaks but progressed through BADL with overall min assist for dressing  BADL (hook bra, pulling up pants and donning TEDs/shoes) and mod vc to sequence and problem-solve.   Pt recovered to recliner at end of session with all needs placed within reach.   Therapy Documentation Precautions:  Precautions Precautions: Fall Restrictions Weight Bearing Restrictions: No   Vital Signs: Therapy Vitals Pulse Rate:  84 BP: (!) 148/66   Pain: Pain Assessment Pain Assessment: 0-10 Pain Score: 5  Pain Type: Acute pain Pain Location: Chest Pain Orientation: Upper;Left Pain Frequency: Constant Pain Onset: On-going    See Function Navigator for Current Functional Status.   Therapy/Group: Individual Therapy  McDonald 09/29/2016, 12:39 PM

## 2016-09-29 NOTE — Progress Notes (Signed)
Physical Therapy Session Note  Patient Details  Name: Savannah Travis MRN: [id number] Date of Birth: [birth year]  Today's Date: 09/29/2016 PT Individual Time: 0802-0900 AND 1545-1620 PT Individual Time Calculation (min): 58 min  AND 35 min    Short Term Goals: Week 1:  PT Short Term Goal 1 (Week 1): pt will move supine> sitting with min assist PT Short Term Goal 2 (Week 1): pt will transfer bed>< w/c with mod assist PT Short Term Goal 3 (Week 1): pt will propel w/c x 100' with min assist for strengthening PT Short Term Goal 4 (Week 1): pt will perform gait x 50' with mod assist  Skilled Therapeutic Interventions/Progress Updates:     Pt received supine in bed and agreeable to PT. Supine>sit transfer with min assist and min cues for proper use of LUE on rail.    Sit<>stand with supervision assist and RW.  Gait to BR with supervision progressing to min assist.   Prior to toileting, increased difficulty with clothing management compared to previous session stand to sit with mod assist.   Patient noted to have increased difficulty with speech and reports pain in shoulder and neck that then radiated to chest. Decreased arousal noted with minimal eye opening, but still responsive to questions. Vitals assess on toilet (see below)  RN present to assess patient. Patient keep repeating "lay down" while on toilet.    squat pivot +2 mod Assist Transfer to [facility] and then to bed. Max assist to supine in bed. PTrequired to manage BLE and trunk. Once in bed, patient attempting to say something, but words were unclear.   Orthostatic vitals assessed in supine/sitting EOB per MD instruction ( see below)  Patient remained symptomatic for ~25 minutes, and became more aroused with less chest pain at end of session. RN notified MD.   Patient left in bed with call bell in reach.   Session 2.   Pt received sitting in WC and agreeable to PT  PT transferred patient to rehab gym in [facility]. Gait training with focus on  neuromuscular re-education for the R UE.  73ft with RW and supervision progressing to min assist.  constant cues for improved step length  And step height to allow foot clearance on the R. Patient noted to have mild improvement in step height,bbut reports increased knee pain the LLE. Dynamic gait training to weave through 5 cones with RW with cues for proper RLE foot placement with turns to L.  Throughout treatment patient performed sit<>stand from low surface x 3 with min assist and slightly elevated surface with supervision assist.  Patient returned to room and left sitting in [facility] with call bell in reach.     Therapy Documentation Precautions:  Precautions Precautions: Fall Restrictions Weight Bearing Restrictions: No   Vital Signs: Therapy Vitals Pulse Rate: 84 BP: (!) 148/66  Orthostatic:  Supine 143/73 Sitting: 141/65 Pain: 1/10 in the L knee following gait.   See Function Navigator for Current Functional Status.   Therapy/Group: Individual Therapy  Lorie Phenix 09/29/2016, 9:03 AM

## 2016-09-30 ENCOUNTER — Inpatient Hospital Stay (HOSPITAL_COMMUNITY): Payer: Medicare Other

## 2016-09-30 MED ORDER — DICLOFENAC SODIUM 1 % TD GEL
2.0000 g | Freq: Four times a day (QID) | TRANSDERMAL | Status: DC
  Administered 2016-09-30 – 2016-10-12 (×40): 2 g via TOPICAL
  Filled 2016-09-30 (×2): qty 100

## 2016-09-30 NOTE — Progress Notes (Signed)
Physical Therapy Note  Patient Details  Name: Vonetta Bole MRN: [id number] Date of Birth: 1941/03/30 Today's Date: 09/30/2016  [id number], 45 min individual tx Pain: 1/10 L knee at rest  Pt c/o L knee hurting when walking and twisting.  Pt educated about avoiding twisting.  MD examined her L knee and believes she has meniscal tear.  He will prescribe a med.  W/c foot rests switched, apple wood insert added to cushion, and brake extension added, to improved fit and function given pt's short stature and body habitus.  neuromuscular re-education via forced use for alternating reciprocal movement x bil LEs on Kinetron in sttting at resistance 80, x 25 cycles, counting aloiud to work on voice volume. Gait over level tile with RW; cues for longer R step length, assistance for wt shift to L to facilitate R step.  Pt stated she has always walked with body and bil LEs stiff. and . Pt stated ACE might help L knee pain.  Pt left resting in w/c with  all needs within reach.  See function navigator for current status.   Mercia Dowe 09/30/2016, 8:16 AM

## 2016-09-30 NOTE — Progress Notes (Signed)
Subjective/Complaints: No repeat episodes of syncope, patient is in good spirits working with physical therapy this morning. Left knee pain, had this at home primarily with standing. Review systems negative for chest pain, shortness of breath, nausea, vomiting or blacking out X-ray left knee 09/11/2016, reviewed Objective: Vital Signs: Blood pressure 132/62, pulse 77, temperature 98.1 F (36.7 C), temperature source Oral, resp. rate 16, height 4' 8"  (1.422 m), weight 92.2 kg (203 lb 4.2 oz), SpO2 97 %. No results found. Results for orders placed or performed during the hospital encounter of 09/25/16 (from the past 72 hour(s))  Basic metabolic panel     Status: Abnormal   Collection Time: 09/29/16  8:39 AM  Result Value Ref Range   Sodium 139 135 - 145 mmol/L   Potassium 3.4 (L) 3.5 - 5.1 mmol/L   Chloride 109 101 - 111 mmol/L   CO2 21 (L) 22 - 32 mmol/L   Glucose, Bld 128 (H) 65 - 99 mg/dL   BUN 14 6 - 20 mg/dL   Creatinine, Ser 0.93 0.44 - 1.00 mg/dL   Calcium 9.1 8.9 - 10.3 mg/dL   GFR calc non Af Amer 59 (L) >60 mL/min   GFR calc Af Amer >60 >60 mL/min    Comment: (NOTE) The eGFR has been calculated using the CKD EPI equation. This calculation has not been validated in all clinical situations. eGFR's persistently <60 mL/min signify possible Chronic Kidney Disease.    Anion gap 9 5 - 15     HEENT: normal and +dysarthria Cardio: RRR and No murmurs or extra sounds, Tenderness to palpation along the costochondral  junction on the left side Resp: CTA B/L and Unlabored GI: BS positive and Nontender, nondistended Extremity:  Pulses positive and No Edema Skin:   Intact Neuro: Alert/Oriented, Cranial Nerve II-XII normal, Normal Sensory, Abnormal Motor Motor strength is 3 minus in the right deltoid, bicep, triceps, grip,4 minus in the right hip flexor, knee extensor, ankle dorsiflexor.4/5 in left deltoid, biceps, triceps, grip, hip flexor, knee extensor, ankle dorsi flexor,  Abnormal FMC Ataxic/ dec FMC, Tone:  Within Normal Limits, Dysarthric and Aphasic Musc/Skel:  Other Limited right shoulder range of motion without pain. Gen. no acute distress   Assessment/Plan: 1. Functional deficits secondary to left corona radiata infarct, right hemiparesis, aphasia, dysarthria which require 3+ hours per day of interdisciplinary therapy in a comprehensive inpatient rehab setting. Physiatrist is providing close team supervision and 24 hour management of active medical problems listed below. Physiatrist and rehab team continue to assess barriers to discharge/monitor patient progress toward functional and medical goals. FIM: Function - Bathing Position: Wheelchair/chair at sink Body parts bathed by patient: Right arm, Left arm, Chest, Abdomen, Front perineal area, Right upper leg, Left upper leg Body parts bathed by helper: Back, Buttocks Bathing not applicable: Right lower leg, Left lower leg  Function- Upper Body Dressing/Undressing What is the patient wearing?: Bra, Pull over shirt/dress Bra - Perfomed by patient: Thread/unthread right bra strap, Thread/unthread left bra strap Bra - Perfomed by helper: Hook/unhook bra (pull down sports bra) Pull over shirt/dress - Perfomed by patient: Thread/unthread right sleeve, Thread/unthread left sleeve, Put head through opening, Pull shirt over trunk Assist Level: Supervision or verbal cues Function - Lower Body Dressing/Undressing What is the patient wearing?: Underwear, Pants, Maryln Manuel, Shoes Position: Wheelchair/chair at [employer] - Performed by patient: Thread/unthread right underwear leg, Thread/unthread left underwear leg Underwear - Performed by helper: Pull underwear up/down Pants- Performed by patient: Thread/unthread right pants  leg, Pull pants up/down, Thread/unthread left pants leg, Fasten/unfasten pants Pants- Performed by helper: Pull pants up/down Non-skid slipper socks- Performed by helper: Don/doff right  sock, Don/doff left sock Shoes - Performed by helper: Don/doff right shoe, Fasten right TED Hose - Performed by helper: Don/doff right TED hose, Don/doff left TED hose Assist for footwear: Dependant Assist for lower body dressing:  (2/8 completed by patient, 25%, max assist)  Function - Toileting Toileting steps completed by patient: Adjust clothing prior to toileting, Adjust clothing after toileting Toileting steps completed by helper: Performs perineal hygiene Toileting Assistive Devices: Grab bar or rail Assist level: Touching or steadying assistance (Pt.75%)  Function - Air cabin crew transfer assistive device: Bedside commode Assist level to toilet: Moderate assist (Pt 50 - 74%/lift or lower) Assist level from toilet: Moderate assist (Pt 50 - 74%/lift or lower) Assist level to bedside commode (at bedside): Touching or steadying assistance (Pt > 75%) Assist level from bedside commode (at bedside): Touching or steadying assistance (Pt > 75%)  Function - Chair/bed transfer Chair/bed transfer activity did not occur: N/A Chair/bed transfer method: Stand pivot Chair/bed transfer assist level: Touching or steadying assistance (Pt > 75%) Chair/bed transfer assistive device: Walker, Armrests Chair/bed transfer details: Manual facilitation for weight shifting, Verbal cues for technique, Manual facilitation for placement  Function - Locomotion: Wheelchair Will patient use wheelchair at discharge?: Yes Type: Manual Max wheelchair distance: 34f  Assist Level: Moderate assistance (Pt 50 - 74%) Assist Level: Moderate assistance (Pt 50 - 74%) Function - Locomotion: Ambulation Assistive device: Walker-rolling Max distance: 775fAssist level: Touching or steadying assistance (Pt > 75%) Assist level: Supervision or verbal cues Walk 50 feet with 2 turns activity did not occur: Safety/medical concerns (L knee painful, R knee weak) Assist level: Touching or steadying assistance (Pt >  75%) Walk 10 feet on uneven surfaces activity did not occur: Safety/medical concerns (L knee painful, R knee weak) Assist level: Moderate assist (Pt 50 - 74%)  Function - Comprehension Comprehension: Auditory Comprehension assist level: Follows basic conversation/direction with extra time/assistive device  Function - Expression Expression: Verbal Expression assist level: Expresses basic needs/ideas: With extra time/assistive device  Function - Social Interaction Social Interaction assist level: Interacts appropriately with others - No medications needed.  Function - Problem Solving Problem solving assist level: Solves basic 90% of the time/requires cueing < 10% of the time  Function - Memory Memory assist level: Recognizes or recalls 90% of the time/requires cueing < 10% of the time Patient normally able to recall (first 3 days only): That he or she is in a hospital   Medical Problem List and Plan: 1.Decreased functional mobility with lower extremity weakness/altered mental status/question syncopesecondary to left corona radiata infarct -CIR PTOT speech , no further episodes of syncope 2. DVT Prophylaxis/Anticoagulation: SCDs. Monitor for any signs of DVT 3. Pain Management/headaches: Topamax 25 mg twice a day, chest pain, musculoskeletal chest wall, sports cream Left knee pain, advanced osteoarthritis, medial compartment, as well as patellofemoral, moderate at the lateral compartment. We'll order Voltaren gel. May benefit from a knee orthosis 4. Mood: Ludiomil 50 mg daily at bedtime.Klonopin 1 mg daily at bedtime---pt would like this scheduled 5. Neuropsych: This patient iscapable of making decisions on herown behalf. 6. Skin/Wound Care: Routine skin checks 7. Fluids/Electrolytes/Nutrition: Routine I&O with follow-up chemistries upon admit. Encourage PO, 1000 mL fluid intake, 85-100% meal intake. 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  Continue Trileptal 300 mg twice a day           -  recent EEG negative, will repeat if this occurs again as suggested by Neurohospitalist. Ask neurology weather increase Trileptal dose is recommended- Did not rec unless recurrent 9.Hypertension. Norvasc 5 mg daily. Monitor with increased mobility 10.Klebsiella UTI. Ceftin initiated 09/24/2016 transitioning from Rocephin. May have spastic neurogenic bladder in addition to UTI. Hold off on anticholinergic agents until UTI treated, PVR 0-30 cc 11.Hyperlipidemia. Lipitor 12.History of gout. Allopurinol 300 mg daily. Monitor for any gout flareups. No pain at present    13.  Pre renal azotemia, continue nocturnal IVF, repeat bmet in a.m. LOS (Days) 5 A FACE TO FACE EVALUATION WAS PERFORMED  Seniya Stoffers E 09/30/2016, 10:12 AM

## 2016-10-01 ENCOUNTER — Inpatient Hospital Stay (HOSPITAL_COMMUNITY): Payer: Medicare Other

## 2016-10-01 ENCOUNTER — Inpatient Hospital Stay (HOSPITAL_COMMUNITY): Payer: Medicare Other | Admitting: Speech Pathology

## 2016-10-01 ENCOUNTER — Inpatient Hospital Stay (HOSPITAL_COMMUNITY): Payer: Medicare Other | Admitting: Occupational Therapy

## 2016-10-01 LAB — GLUCOSE, CAPILLARY: Glucose-Capillary: 90 mg/dL (ref 65–99)

## 2016-10-01 NOTE — Progress Notes (Signed)
Speech Language Pathology Daily Session Note  Patient Details  Name: Savannah Travis MRN: [id number] Date of Birth: Mar 25, 1941  Today's Date: 10/01/2016 SLP Individual Time: [id number] SLP Individual Time Calculation (min): 45 min   Short Term Goals:Week 1: SLP Short Term Goal 1 (Week 1): Pt will utilize overarticulation and increased vocal intensity to achieve intelligibility in conversations in a minimally distracting environment with supervision.  SLP Short Term Goal 2 (Week 1): Pt will utilize compensatory word finding strategies in conversations with supervision verbal cues.   Skilled Therapeutic Interventions: Skilled treatment session focused on addressing communication goals.  SLP facilitated session by providing education with teach back for patient to understand and recall word finding compensatory strategies.  Patient then utilized visualization and description with Supervision-Min assist question cues during completion of a structured word description task.  Patient reported being less intelligible today; as she was fatigued this afternoon.  However, patient overall 90% intelligible at the sentence level in a quiet environment.  Continue with current plan of care.   Function:   Cognition Comprehension Comprehension assist level: Follows basic conversation/direction with extra time/assistive device  Expression   Expression assist level: Expresses basic 90% of the time/requires cueing < 10% of the time.  Social Interaction Social Interaction assist level: Interacts appropriately 90% of the time - Needs monitoring or encouragement for participation or interaction.  Problem Solving Problem solving assist level: Solves basic 90% of the time/requires cueing < 10% of the time  Memory Memory assist level: Recognizes or recalls 90% of the time/requires cueing < 10% of the time    Pain Pain Assessment Pain Assessment: No/denies pain  Therapy/Group: Individual Therapy  Carmelia Roller., [place] [id number]  [place] 10/01/2016, 4:17 PM

## 2016-10-01 NOTE — Progress Notes (Signed)
Occupational Therapy Session Note  Patient Details  Name: Savannah Travis MRN: [id number] Date of Birth: 1941/09/05  Today's Date: 10/01/2016 OT Individual Time: [id number] OT Individual Time Calculation (min): 44 min     Short Term Goals: Week 1:  OT Short Term Goal 1 (Week 1): Pt will completed LB bathing with min assist sit to stand.  OT Short Term Goal 2 (Week 1): Pt will complete LB dressing sit to stand with min assist.  OT Short Term Goal 3 (Week 1): Pt will complete stand pivot toilet transfers with min asisst using the RW.  OT Short Term Goal 4 (Week 1): Pt will complete walk-in shower transfers with min assist using the RW.  OT Short Term Goal 5 (Week 1): Pt will use the RUE to comb her hair with supervision.   Skilled Therapeutic Interventions/Progress Updates:    Pt completed self feeding using the RUE during session.  Noted motor planning issues with attempted use as it is very slow when attempting to grasp spoon and bring to mouth.  She was able to complete toilet transfer next with min assist using the RW.  Decreased ability to complete adequate step length on the right side with mobility during the transfer.  Practiced walk-in shower transfer as well using the RW and stepping forward over the edge of the shower with min assist for balance, as this is how pt will likely complete the transfer at home.  Pt left in wheelchair at end of session with call button and phone in reach.   Therapy Documentation Precautions:  Precautions Precautions: Fall Restrictions Weight Bearing Restrictions: No   Pain: Pain Assessment Pain Assessment: No/denies pain ADL: See Function Navigator for Current Functional Status.   Therapy/Group: Individual Therapy  Brysyn Brandenberger OTR/L 10/01/2016, 3:46 PM

## 2016-10-01 NOTE — Progress Notes (Signed)
Physical Therapy Note  Patient Details  Name: Savannah Travis MRN: [id number] Date of Birth: 1941/04/19 Today's Date: 10/01/2016  0800-0900, 60 min individual tx Pain: none reported.  PT requested pt have Voltaren gel applied to L knee before tx started.   Bed moiblity to set EOB to eat breakfast; mod assist L sidelying > sit, mod cues for technique and sequencing, as pt attempts to move LEs off of bed and then sit up from supine.  Pt sat EOB with bil feet suipported to finish eating breakfast.  Pt spontanewouly used R hand throughout self feeding and package opening.  gat in room iwht RW to toilet for continent voiding.  Pt had LOB due to RLE "freezing" as she tried to back up to toilet; mod assist needed.  Pt stood at sink for hand hygiene. Pt unsafely attempted to reach for sink again when too far away, and brakes unlocked, impulsively.  When backing up 2 stesp with RW to sit in w/c, pt turned to R to look for w/c and had LOB requiring max assist to sit quickly in w/c and prevent fall.  Therapeutic activity in standing wt shifting R><L x 10 with pt c/o crepitus and pain with L wt bearing;  wt shift to L in order to clear R foot over obstacel placed on floor, x 1.  Pt declined practicing this further due to fear of increasing L knee pain today.  Pt left resting in w/c with all needs within reach.  See function navigator for current status  Terease Marcotte 10/01/2016, 7:51 AM

## 2016-10-01 NOTE — Progress Notes (Signed)
Gave report to 7p nurse. Nurse called to room at approx 1935, pt in bathroom, reported sharp pain in head and chest pain, pt max to transfer, unable to respond to commands, rapid response team called, O2, at 2l, ekg obtained, IV team called, will continue plan of care.

## 2016-10-01 NOTE — Progress Notes (Addendum)
Occupational Therapy Session Note  Patient Details  Name: Savannah Travis MRN: [id number] Date of Birth: 26-Mar-1941  Today's Date: 10/01/2016 OT Individual Time: [id number] OT Individual Time Calculation (min): 65 min     Short Term Goals: Week 1:  OT Short Term Goal 1 (Week 1): Pt will completed LB bathing with min assist sit to stand.  OT Short Term Goal 2 (Week 1): Pt will complete LB dressing sit to stand with min assist.  OT Short Term Goal 3 (Week 1): Pt will complete stand pivot toilet transfers with min asisst using the RW.  OT Short Term Goal 4 (Week 1): Pt will complete walk-in shower transfers with min assist using the RW.  OT Short Term Goal 5 (Week 1): Pt will use the RUE to comb her hair with supervision.   Skilled Therapeutic Interventions/Progress Updates:   Pt was sitting in w/c at time of arrival, agreeable to complete ADLs. Tx focus on R UE functional use, coordination, functional transfers, and standing endurance. Pt ambulated into bathroom for shower transfer with steady assist and instruction on technique. Max cues for right foot clearance and manual assist for weight shifting. IV covered. Bathing completed with overall Mod A for LEs. Mod cues for incorporation of R UE into tasks. Dressing then completed w/c level in room standing as needed with RW. Pt required Mod-Max A overall due to endurance and strength deficits. Reacher used for threading LEs through pant legs with instruction on hemi technique. Would benefit from sock aide training during later session. Pt was left with all needs within reach at time of departure.    Therapy Documentation Precautions:  Precautions Precautions: Fall Restrictions Weight Bearing Restrictions: No   Pain: No c/o pain during session    ADL:   See Function Navigator for Current Functional Status.   Therapy/Group: Individual Therapy  Sharin Altidor A Halah Whiteside 10/01/2016, 12:33 PM

## 2016-10-01 NOTE — Progress Notes (Signed)
I received a call from [place], she states Ms. Savannah Travis was in the bathroom and complain of chest pain and sharp pain in her head. Rapid Response was called, EKG was obtained. VSS. Her daughter stated she had the same episode on Saturday 09/29/2016. Reviewed Dr. Letta Pate note, and placed a call to Dr. Letta Pate and reviewed the above. Dr. Letta Pate states Savannah Travis had a syncopal episode on Saturday.  Placed a call to [employer], will continue to observe Savannah Travis. At this time she denies any chest pain.

## 2016-10-01 NOTE — Progress Notes (Signed)
Subjective/Complaints: No repeat episodes of syncope, patient  working with physical therapy this morning. No left knee pain c/o  ROS  Denies N/V/D, no CP or SOB Objective: Vital Signs: Blood pressure 130/60, pulse 70, temperature 97.9 F (36.6 C), temperature source Oral, resp. rate 18, height _0  (1.422 m), weight 92.2 kg (203 lb 4.2 oz), SpO2 99 %. No results found. Results for orders placed or performed during the hospital encounter of 09/25/16 (from the past 72 hour(s))  Basic metabolic panel     Status: Abnormal   Collection Time: 09/29/16  8:39 AM  Result Value Ref Range   Sodium 139 135 - 145 mmol/L   Potassium 3.4 (L) 3.5 - 5.1 mmol/L   Chloride 109 101 - 111 mmol/L   CO2 21 (L) 22 - 32 mmol/L   Glucose, Bld 128 (H) 65 - 99 mg/dL   BUN 14 6 - 20 mg/dL   Creatinine, Ser 0.93 0.44 - 1.00 mg/dL   Calcium 9.1 8.9 - 10.3 mg/dL   GFR calc non Af Amer 59 (L) >60 mL/min   GFR calc Af Amer >60 >60 mL/min    Comment: (NOTE) The eGFR has been calculated using the CKD EPI equation. This calculation has not been validated in all clinical situations. eGFR's persistently <60 mL/min signify possible Chronic Kidney Disease.    Anion gap 9 5 - 15     HEENT: normal and +dysarthria Cardio: RRR and No murmurs or extra sounds, Tenderness to palpation along the costochondral  junction on the left side Resp: CTA B/L and Unlabored GI: BS positive and Nontender, nondistended Extremity:  Pulses positive and No Edema Skin:   Intact Neuro: Alert/Oriented, Cranial Nerve II-XII normal, Normal Sensory, Abnormal Motor Motor strength is 3 minus in the right deltoid, bicep, triceps, grip,4 minus in the right hip flexor, knee extensor, ankle dorsiflexor.4/5 in left deltoid, biceps, triceps, grip, hip flexor, knee extensor, ankle dorsi flexor, Abnormal FMC Ataxic/ dec FMC, Tone:  Within Normal Limits, Dysarthric and Aphasic Musc/Skel:  Other Limited right shoulder range of motion without  pain. Gen. no acute distress   Assessment/Plan: 1. Functional deficits secondary to left corona radiata infarct, right hemiparesis, aphasia, dysarthria which require 3+ hours per day of interdisciplinary therapy in a comprehensive inpatient rehab setting. Physiatrist is providing close team supervision and 24 hour management of active medical problems listed below. Physiatrist and rehab team continue to assess barriers to discharge/monitor patient progress toward functional and medical goals. FIM: Function - Bathing Position: Wheelchair/chair at sink Body parts bathed by patient: Right arm, Left arm, Chest, Abdomen, Front perineal area, Right upper leg, Left upper leg Body parts bathed by helper: Back, Buttocks Bathing not applicable: Right lower leg, Left lower leg  Function- Upper Body Dressing/Undressing What is the patient wearing?: Bra, Pull over shirt/dress Bra - Perfomed by patient: Thread/unthread right bra strap, Thread/unthread left bra strap Bra - Perfomed by helper: Hook/unhook bra (pull down sports bra) Pull over shirt/dress - Perfomed by patient: Thread/unthread right sleeve, Thread/unthread left sleeve, Put head through opening, Pull shirt over trunk Assist Level: Supervision or verbal cues Function - Lower Body Dressing/Undressing What is the patient wearing?: Underwear, Pants, Maryln Manuel, Shoes Position: Education officer, museum at [employer] - Performed by patient: Thread/unthread right underwear leg, Thread/unthread left underwear leg Underwear - Performed by helper: Pull underwear up/down Pants- Performed by patient: Thread/unthread right pants leg, Pull pants up/down, Thread/unthread left pants leg, Fasten/unfasten pants Pants- Performed by helper: Pull pants up/down Non-skid  slipper socks- Performed by helper: Don/doff right sock, Don/doff left sock Shoes - Performed by helper: Don/doff right shoe, Fasten right TED Hose - Performed by helper: Don/doff right TED hose,  Don/doff left TED hose Assist for footwear: Dependant Assist for lower body dressing:  (2/8 completed by patient, 25%, max assist)  Function - Toileting Toileting steps completed by patient: Adjust clothing prior to toileting, Adjust clothing after toileting Toileting steps completed by helper: Performs perineal hygiene Toileting Assistive Devices: Grab bar or rail Assist level: Touching or steadying assistance (Pt.75%)  Function - Air cabin crew transfer assistive device: Bedside commode Assist level to toilet: Moderate assist (Pt 50 - 74%/lift or lower) Assist level from toilet: Moderate assist (Pt 50 - 74%/lift or lower) Assist level to bedside commode (at bedside): Touching or steadying assistance (Pt > 75%) Assist level from bedside commode (at bedside): Touching or steadying assistance (Pt > 75%)  Function - Chair/bed transfer Chair/bed transfer activity did not occur: N/A Chair/bed transfer method: Stand pivot Chair/bed transfer assist level: Touching or steadying assistance (Pt > 75%) Chair/bed transfer assistive device: Walker, Armrests Chair/bed transfer details: Manual facilitation for weight shifting, Verbal cues for technique, Manual facilitation for placement  Function - Locomotion: Wheelchair Will patient use wheelchair at discharge?: Yes Type: Manual Max wheelchair distance: 27f  Assist Level: Moderate assistance (Pt 50 - 74%) Assist Level: Moderate assistance (Pt 50 - 74%) Function - Locomotion: Ambulation Assistive device: Walker-rolling, Orthosis (ACe L knee) Max distance: 50 Assist level: Touching or steadying assistance (Pt > 75%) Assist level: Touching or steadying assistance (Pt > 75%) Walk 50 feet with 2 turns activity did not occur: Safety/medical concerns (L knee painful, R knee weak) Assist level: Touching or steadying assistance (Pt > 75%) Walk 10 feet on uneven surfaces activity did not occur: Safety/medical concerns (L knee painful, R knee  weak) Assist level: Moderate assist (Pt 50 - 74%)  Function - Comprehension Comprehension: Auditory Comprehension assist level: Follows basic conversation/direction with extra time/assistive device  Function - Expression Expression: Verbal Expression assist level: Expresses basic needs/ideas: With extra time/assistive device  Function - Social Interaction Social Interaction assist level: Interacts appropriately with others - No medications needed.  Function - Problem Solving Problem solving assist level: Solves basic 90% of the time/requires cueing < 10% of the time  Function - Memory Memory assist level: Recognizes or recalls 90% of the time/requires cueing < 10% of the time Patient normally able to recall (first 3 days only): That he or she is in a hospital   Medical Problem List and Plan: 1.Decreased functional mobility with lower extremity weakness/altered mental status/question syncopesecondary to left corona radiata infarct -CIR PTOT speech , no further episodes of syncope 2. DVT Prophylaxis/Anticoagulation: SCDs. Monitor for any signs of DVT 3. Pain Management/headaches: Topamax 25 mg twice a day, chest pain, musculoskeletal chest wall, sports cream Left knee pain, advanced osteoarthritis, medial compartment, as well as patellofemoral, moderate at the lateral compartment. Cont Voltaren gel if not helpful may order a knee orthosis 4. Mood: Ludiomil 50 mg daily at bedtime.Klonopin 1 mg daily at bedtime---pt would like this scheduled 5. Neuropsych: This patient iscapable of making decisions on herown behalf. 6. Skin/Wound Care: Routine skin checks 7. Fluids/Electrolytes/Nutrition: Routine I&O with follow-up chemistries upon admit. Encourage PO, 1000 mL fluid intake, 85-100% meal intake. 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 Continue Trileptal 300 mg twice a day           -recent EEG negative, will repeat if  this occurs again as  suggested by Neurohospitalist. Ask neurology weather increase Trileptal dose is recommended- Did not rec unless recurrent 9.Hypertension. Norvasc 5 mg daily. Monitor with increased mobility 10.Klebsiella UTI. Ceftin initiated 09/24/2016 transitioning from Rocephin. May have spastic neurogenic bladder in addition to UTI. Hold off on anticholinergic agents until UTI treated, PVR 0-30 cc 11.Hyperlipidemia. Lipitor 12.History of gout. Allopurinol 300 mg daily. Monitor for any gout flareups. No pain at present    13.  Pre renal azotemia,repeat bmet in a.m., d/c IVF 14.  HypoK will add supplentation LOS (Days) 6 A FACE TO FACE EVALUATION WAS PERFORMED  KIRSTEINS,ANDREW E 10/01/2016, 8:39 AM

## 2016-10-02 ENCOUNTER — Inpatient Hospital Stay (HOSPITAL_COMMUNITY): Payer: Medicare Other | Admitting: *Deleted

## 2016-10-02 ENCOUNTER — Inpatient Hospital Stay (HOSPITAL_COMMUNITY): Payer: Medicare Other | Admitting: Speech Pathology

## 2016-10-02 ENCOUNTER — Inpatient Hospital Stay (HOSPITAL_COMMUNITY): Payer: Medicare Other | Admitting: Occupational Therapy

## 2016-10-02 ENCOUNTER — Inpatient Hospital Stay (HOSPITAL_COMMUNITY): Payer: Medicare Other | Admitting: Physical Therapy

## 2016-10-02 ENCOUNTER — Ambulatory Visit: Payer: Medicare Other | Admitting: Diagnostic Neuroimaging

## 2016-10-02 LAB — GLUCOSE, CAPILLARY: GLUCOSE-CAPILLARY: 142 mg/dL — AB (ref 65–99)

## 2016-10-02 MED ORDER — POTASSIUM CHLORIDE CRYS ER 10 MEQ PO TBCR
10.0000 meq | EXTENDED_RELEASE_TABLET | Freq: Every day | ORAL | Status: DC
  Administered 2016-10-02 – 2016-10-12 (×11): 10 meq via ORAL
  Filled 2016-10-02 (×11): qty 1

## 2016-10-02 NOTE — Progress Notes (Signed)
Subjective/Complaints: Episode of headache and chest pain. No syncope. Once again, was on toilet. Denies straining. However, patient states that she was trying to go and could not. Previous similar episode on Saturday, although on Saturday may have had some mental status changes as well. ROS  Denies N/V/D, no CP or SOB Objective: Vital Signs: Blood pressure (!) 114/46, pulse 79, temperature 97.8 F (36.6 C), temperature source Oral, resp. rate 18, height 4\' 8"  (1.422 m), weight 92.2 kg (203 lb 4.2 oz), SpO2 97 %. No results found. No results found for this or any previous visit (from the past 72 hour(s)).   HEENT: normal and +dysarthria Cardio: RRR and No murmurs or extra sounds, Tenderness to palpation along the costochondral  junction on the left side Resp: CTA B/L and Unlabored GI: BS positive and Nontender, nondistended Extremity:  Pulses positive and No Edema Skin:   Intact Neuro: Alert/Oriented, Cranial Nerve II-XII normal, Normal Sensory, Abnormal Motor Motor strength is 3 minus in the right deltoid, bicep, triceps, grip,4 minus in the right hip flexor, knee extensor, ankle dorsiflexor.4/5 in left deltoid, biceps, triceps, grip, hip flexor, knee extensor, ankle dorsi flexor, Abnormal FMC Ataxic/ dec FMC, Tone:  Within Normal Limits, Dysarthric and Aphasic Musc/Skel:  Other Limited right shoulder range of motion without pain. Gen. no acute distress   Assessment/Plan: 1. Functional deficits secondary to left corona radiata infarct, right hemiparesis, aphasia, dysarthria which require 3+ hours per day of interdisciplinary therapy in a comprehensive inpatient rehab setting. Physiatrist is providing close team supervision and 24 hour management of active medical problems listed below. Physiatrist and rehab team continue to assess barriers to discharge/monitor patient progress toward functional and medical goals. FIM: Function - Bathing Position: Shower Body parts bathed by patient:  Right arm, Left arm, Chest, Abdomen, Front perineal area, Right upper leg, Left upper leg, Buttocks Body parts bathed by helper: Right lower leg, Left lower leg, Back Bathing not applicable: Right lower leg, Left lower leg Assist Level: Touching or steadying assistance(Pt > 75%)  Function- Upper Body Dressing/Undressing What is the patient wearing?: Pull over shirt/dress Bra - Perfomed by patient: Thread/unthread right bra strap, Thread/unthread left bra strap Bra - Perfomed by helper: Hook/unhook bra (pull down sports bra) Pull over shirt/dress - Perfomed by patient: Thread/unthread right sleeve, Thread/unthread left sleeve, Put head through opening, Pull shirt over trunk Assist Level: Supervision or verbal cues Function - Lower Body Dressing/Undressing What is the patient wearing?: Underwear, Pants, Maryln Manuel, Shoes Position: Education officer, museum at [employer] - Performed by patient: Thread/unthread right underwear leg, Thread/unthread left underwear leg Underwear - Performed by helper: Pull underwear up/down Pants- Performed by patient: Thread/unthread right pants leg, Thread/unthread left pants leg Pants- Performed by helper: Pull pants up/down Non-skid slipper socks- Performed by helper: Don/doff right sock, Don/doff left sock Shoes - Performed by helper: Don/doff right shoe, Don/doff left shoe TED Hose - Performed by helper: Don/doff right TED hose, Don/doff left TED hose Assist for footwear: Dependant Assist for lower body dressing:  (Mod-Max A)  Function - Toileting Toileting steps completed by patient: Adjust clothing prior to toileting, Performs perineal hygiene Toileting steps completed by helper: Performs perineal hygiene Toileting Assistive Devices: Grab bar or rail Assist level: Touching or steadying assistance (Pt.75%)  Function - Air cabin crew transfer assistive device: Elevated toilet seat/BSC over toilet, Walker, Grab bar Assist level to toilet: Touching or  steadying assistance (Pt > 75%) Assist level from toilet: Touching or steadying assistance (Pt > 75%) Assist  level to bedside commode (at bedside): Touching or steadying assistance (Pt > 75%) Assist level from bedside commode (at bedside): Touching or steadying assistance (Pt > 75%)  Function - Chair/bed transfer Chair/bed transfer activity did not occur: N/A Chair/bed transfer method: Stand pivot Chair/bed transfer assist level: Touching or steadying assistance (Pt > 75%) Chair/bed transfer assistive device: Armrests, Walker Chair/bed transfer details: Manual facilitation for weight shifting, Verbal cues for technique, Manual facilitation for placement  Function - Locomotion: Wheelchair Will patient use wheelchair at discharge?: Yes Type: Manual Max wheelchair distance: 73ft  Assist Level: Moderate assistance (Pt 50 - 74%) Assist Level: Moderate assistance (Pt 50 - 74%) Function - Locomotion: Ambulation Assistive device: Walker-rolling Max distance: 20 Assist level: Touching or steadying assistance (Pt > 75%) Assist level: Touching or steadying assistance (Pt > 75%) Walk 50 feet with 2 turns activity did not occur: Safety/medical concerns (L knee painful, R knee weak) Assist level: Touching or steadying assistance (Pt > 75%) Walk 10 feet on uneven surfaces activity did not occur: Safety/medical concerns (L knee painful, R knee weak) Assist level: Moderate assist (Pt 50 - 74%)  Function - Comprehension Comprehension: Auditory Comprehension assist level: Follows basic conversation/direction with extra time/assistive device  Function - Expression Expression: Verbal Expression assist level: Expresses basic 90% of the time/requires cueing < 10% of the time.  Function - Social Interaction Social Interaction assist level: Interacts appropriately 90% of the time - Needs monitoring or encouragement for participation or interaction.  Function - Problem Solving Problem solving assist  level: Solves basic 90% of the time/requires cueing < 10% of the time  Function - Memory Memory assist level: Recognizes or recalls 90% of the time/requires cueing < 10% of the time Patient normally able to recall (first 3 days only): That he or she is in a hospital   Medical Problem List and Plan: 1.Decreased functional mobility with lower extremity weakness/altered mental status/question syncopesecondary to left corona radiata infarct -CIR PTOT speech , team conference in a.m. 2. DVT Prophylaxis/Anticoagulation: SCDs. Monitor for any signs of DVT 3. Pain Management/headaches: Topamax 25 mg twice a day, chest pain, musculoskeletal chest wall, sports cream, has had cardiac workup for chest pain which was negative. Repeat EKG times 2 negative. No pain during physical therapy. Left knee pain, advanced osteoarthritis, medial compartment, as well as patellofemoral, moderate at the lateral compartment. Cont Voltaren gel if not helpful may order a knee orthosis 4. Mood: Ludiomil 50 mg daily at bedtime.Klonopin 1 mg daily at bedtime---pt would like this scheduled 5. Neuropsych: This patient iscapable of making decisions on herown behalf. 6. Skin/Wound Care: Routine skin checks 7. Fluids/Electrolytes/Nutrition: Routine I&O with follow-up chemistries upon admit. Encourage PO, 1000 mL fluid intake, 85-100% meal intake. 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 Continue Trileptal 300 mg twice a day           -most recent spell just involved complaints of pain. No abnormal movements, no change in mental status. 9.Hypertension. Norvasc 5 mg daily. Monitor with increased mobility 10.Klebsiella UTI. Ceftin initiated 09/24/2016 transitioning from Rocephin. May have spastic neurogenic bladder in addition to UTI. Hold off on anticholinergic agents until UTI treated, PVR 0-30 cc 11.Hyperlipidemia. Lipitor 12.History of gout. Allopurinol 300 mg daily. Monitor for any gout  flareups. No pain at present    13.  Pre renal azotemia, oral intake, improving 960 mL on 1 /8 repeat bmet in a.m., d/c IVF 14.  HypoK on potassium, follow-up potassium level in a.m. LOS (Days) 7 A  FACE TO FACE EVALUATION WAS PERFORMED  KIRSTEINS,ANDREW E 10/02/2016, 8:45 AM

## 2016-10-02 NOTE — Progress Notes (Signed)
Occupational Therapy Session Note  Patient Details  Name: Savannah Travis MRN: [id number] Date of Birth: 09-11-1941  Today's Date: 10/02/2016 OT Individual Time: 0702-0800 OT Individual Time Calculation (min): 58 min     Short Term Goals: Week 1:  OT Short Term Goal 1 (Week 1): Pt will completed LB bathing with min assist sit to stand.  OT Short Term Goal 2 (Week 1): Pt will complete LB dressing sit to stand with min assist.  OT Short Term Goal 3 (Week 1): Pt will complete stand pivot toilet transfers with min asisst using the RW.  OT Short Term Goal 4 (Week 1): Pt will complete walk-in shower transfers with min assist using the RW.  OT Short Term Goal 5 (Week 1): Pt will use the RUE to comb her hair with supervision.   Skilled Therapeutic Interventions/Progress Updates:     Upon entering the room, pt supine in bed with c/o headache. RN notified. Pt having "episode" last night of chest pain and various other symptoms while on toilet last night. Pt reports feeling weak from these events. Pt performed supine >sit with mod A to EOB. Pt needing lifting assistance to stand from bed for stand pivot transfer to wheelchair. OT propelled pt into bathroom for toilet transfer in which pt needing lifting and lowering assistance for safety. Pt incontinent of bladder with urine getting on floor and clothing. Pt performing hygiene while seated on toilet and donned LB clothing from this postion as well. Pt needing min A for balance and to pull pants over B hips. Pt overall requires additional time and assist this session. Pt seated in wheelchair with breakfast tray set up in front of her and call bell within reach upon exiting the room.   Therapy Documentation Precautions:  Precautions Precautions: Fall Restrictions Weight Bearing Restrictions: No  See Function Navigator for Current Functional Status.   Therapy/Group: Individual Therapy  Gypsy Decant 10/02/2016, 9:58 AM

## 2016-10-02 NOTE — Progress Notes (Signed)
RN called due to pt having a sharp pain to her head and chest while up to bathroom. Pt a/o x4, skin warm and dry. Denies any pain at this time. Daughter to bedside reports this happens often when she gets up to go to bathroom, last episode being Saturday. Pt currently being evaluated with neuro as an out patient for these episodes. On cal NP paged by primary RN; Di Kindle RN, no new orders given other than to monitor patient. No interventions completed by RRT

## 2016-10-02 NOTE — Progress Notes (Signed)
Physical Therapy Session Note  Patient Details  Name: Savannah Travis MRN: [id number] Date of Birth: 07-Nov-1940  Today's Date: 10/02/2016 PT Individual Time: 1430-1505 PT Individual Time Calculation (min): 35 min    Short Term Goals: Week 1:  PT Short Term Goal 1 (Week 1): pt will move supine> sitting with min assist PT Short Term Goal 2 (Week 1): pt will transfer bed>< w/c with mod assist PT Short Term Goal 3 (Week 1): pt will propel w/c x 100' with min assist for strengthening PT Short Term Goal 4 (Week 1): pt will perform gait x 50' with mod assist  Skilled Therapeutic Interventions/Progress Updates:   Pt received seated in w/c, denies pain and agreeable to treatment. Reports some weakness following "episode" last night. Performed seated LE strengthening exercises 2x15 including hip flexion marching, long arc quad, hip adduction isometric. Therapist obtained w/c cushion cover and replaced while pt performed sit <>stand x2 with minA. Gait x25' with minA overall, repetitive cues for RLE step length. Near end of gait trial, pt with freezing episode, would not verbally respond to therapist, and RN obtained w/c to allow pt to sit. Pt with stable vitals, verbally responding to questions within 1 min, and requesting to use restroom. +2 stand pivot to toilet for safety due to pt decreased attention/arousal. Remained seated on toilet with NT present at end of session.   Therapy Documentation Precautions:  Precautions Precautions: Fall Restrictions Weight Bearing Restrictions: No   See Function Navigator for Current Functional Status.   Therapy/Group: Individual Therapy  Luberta Mutter 10/02/2016, 3:54 PM

## 2016-10-02 NOTE — Progress Notes (Signed)
Occupational Therapy Note  Patient Details  Name: Savannah Travis MRN: [id number] Date of Birth: 03-22-1941  Today's Date: 10/02/2016 OT Individual Time: 1300-1400 OT Individual Time Calculation (min): 60 min    Participated in kitchen cooking group activity RT. Focus on activity tolerance, sit to stands, bilateral UE support, energy conservation and functional problem solving how to return to kitchen activity in her home setting.   No indication of pain just fatigue.     Willeen Cass [facility] 10/02/2016, 3:35 PM

## 2016-10-02 NOTE — Progress Notes (Signed)
Recreational Therapy Session Note  Patient Details  Name: Savannah Travis MRN: [id number] Date of Birth: [birth year] Today's Date: 10/02/2016  Pain: no c/o Skilled Therapeutic Interventions/Progress Updates: Pt referred to TR for participation in a kitchen safety/meal prep group.    Group goals:  >Pt will actively participate in 45 minute kitchen task seated level with supervision. >Pt will identify >3 safety concerns during simple prep activity with min questioing    cues. >After discussion, pt will identify opportunities for energy conservation during simple meal prep tasks with min cues.  Pt participated in kitchen safety/meal prep group at overall supervision level for 65 minutes. Education provided on energy conservation techniques and kitchen safety/home modifications. Goals met.   Therapy/Group: Group Therapy Nicoya Friel 10/02/2016, 9:57 AM

## 2016-10-02 NOTE — Progress Notes (Signed)
Speech Language Pathology Daily Session Note  Patient Details  Name: Savannah Travis MRN: [id number] Date of Birth: Nov 16, 1940  Today's Date: 10/02/2016 SLP Individual Time: 1000-1045 SLP Individual Time Calculation (min): 45 min   Short Term Goals: Week 1: SLP Short Term Goal 1 (Week 1): Pt will utilize overarticulation and increased vocal intensity to achieve intelligibility in conversations in a minimally distracting environment with supervision.  SLP Short Term Goal 2 (Week 1): Pt will utilize compensatory word finding strategies in conversations with supervision verbal cues.   Skilled Therapeutic Interventions:   Pt was seen for skilled ST targeting communication goals.  SLP facilitated the session with oral reading tasks to continue to address carryover of compensatory intelligibility strategies.  Pt initially needed min-mod assist verbal cues for slow rate, increased vocal intensity and overarticulation to achieve intelligibility at the paragraph level during reading tasks.  Pt needed min assist question cues to identify the cause of her increased difficulty with intelligibility, which appeared to be primarily related to fatigue.  Following discussion with SLP reviewing and reinforcing use of compensatory strategies pt was able to achieve intelligibility with supervision verbal cues even with mild background noise.  Pt was left in wheelchair with call bell within reach.  Continue per current plan of care.    Function:  Eating Eating                 Cognition Comprehension Comprehension assist level: Follows basic conversation/direction with extra time/assistive device  Expression   Expression assist level: Expresses basic 75 - 89% of the time/requires cueing 10 - 24% of the time. Needs helper to occlude trach/needs to repeat words.  Social Interaction Social Interaction assist level: Interacts appropriately 90% of the time - Needs monitoring or encouragement for participation or  interaction.  Problem Solving Problem solving assist level: Solves basic 90% of the time/requires cueing < 10% of the time  Memory Memory assist level: Recognizes or recalls 90% of the time/requires cueing < 10% of the time    Pain Pain Assessment Pain Assessment: No/denies pain  Therapy/Group: Individual Therapy  Lyndsey Demos, Selinda Orion 10/02/2016, 4:20 PM

## 2016-10-02 NOTE — Evaluation (Signed)
Recreational Therapy Assessment and Plan  Patient Details  Name: Savannah Travis MRN: [id number] Date of Birth: 05-20-1941 Today's Date: 10/02/2016  Rehab Potential: Good ELOS: 14 days   Assessment Problem List: Patient Active Problem List   Diagnosis Date Noted  . Small vessel disease, cerebrovascular 09/25/2016  . Transient alteration of awareness   . Gait disorder   . UTI due to Klebsiella species 09/23/2016  . Pre-diabetes 09/23/2016  . Spinal stenosis of lumbar region without neurogenic claudication   . Cerebrovascular accident (CVA) due to thrombosis of left middle cerebral artery ([place]) 09/22/2016  . Dysarthria 09/21/2016  . Unresponsiveness 09/20/2016  . Hypokalemia 09/20/2016  . Debility 09/20/2016  . Dehydration 09/20/2016  . Prolonged QT interval 09/20/2016  . Hypertension 09/20/2016  . Acute encephalopathy 09/20/2016    Past Medical History:      Past Medical History:  Diagnosis Date  . Cancer (HCC)    breast  . Headache    migraines  . Hypertension   . S/P wrist surgery 2015, 2017   for fx   Past Surgical History:       Past Surgical History:  Procedure Laterality Date  . BREAST SURGERY Right 2012   lumps removed  . OVARIAN CYST SURGERY  1962    Assessment & Plan Clinical Impression: Savannah Travis a 76 y.o.right handed femalewith history of Depression,breast cancer, migraine headaches and her chart review patient lives with spouse. Independent with a walker for short distances. One level home with ramped entrance. She has had some recent falls.. Presented 09/20/2016 with episode of unresponsiveness while riding in a car with her family and intermittent headaches.She began to respond after 10-15 minutes. Family had reported 3 such events since 09/17/2016 as well as complaints of progressive weakness and decrease in mobility. She had had a 30 day cardiac event monitor done last summer in [place] records were not yet available.MRI showed  acute infarct central left corona radiata nonhemorrhagic. MRA with focal high-grade left M1 segment stenosis elsewhere negative intracranial MRA. Patient did not receive TPA. Echocardiogram with ejection fraction of 78% grade 1 diastolic dysfunction.EEG negative. A carotid Doppler 08/08/2016 was negative.Neurology consulted and maintained on aspirin and Plavix for CVA prophylaxis 3 months then Plavix alone. Findings of questionable prediabetes mellitus with hemoglobin A1c 5.6. Klebsiella UTI intravenous Rocephin transitioned to oral Ceftin. MRI cervical lumbar thoracic spine completed due to persistent weakness of lower extremities and felt to be unremarkable as evaluated by Dr. Kathyrn Sheriff Patient transferred to [facility] on 09/25/2016.   Clinical Impression: Pt presents with decreased activity tolerance, decreased functional mobility, decreased balance, decreased coordination Limiting pt's independence with leisure/community pursuits.  Leisure History/Participation Premorbid leisure interest/current participation: Medical laboratory scientific officer - Building control surveyor - Doctor, hospital - Travel (Comment);Crafts - Sewing Other Leisure Interests: Cooking/Baking Leisure Participation Style: With Family/Friends Awareness of Community Resources: Excellent Psychosocial / Spiritual Social interaction - Mood/Behavior: Cooperative Academic librarian Appropriate for Education?: Yes Recreational Therapy Orientation Orientation -Reviewed with patient: Available activity resources Strengths/Weaknesses Patient Strengths/Abilities: Willingness to participate;Active premorbidly Patient weaknesses: Physical limitations TR Patient demonstrates impairments in the following area(s): Endurance;Motor;Safety  Plan Rec Therapy Plan Is patient appropriate for Therapeutic Recreation?: Yes Rehab Potential: Good Treatment times per week: Min 1 group for >30 minutes during LOS Estimated Length of Stay: 14 days TR  Treatment/Interventions: Adaptive equipment instruction;Community reintegration;Patient/family education;Functional mobility training;Group participation (Comment) (kitchen/safety)  Recommendations for other services: None   Discharge Criteria: Patient will be discharged from TR if patient refuses treatment 3 consecutive times without medical  reason.  If treatment goals not met, if there is a change in medical status, if patient makes no progress towards goals or if patient is discharged from hospital.  The above assessment, treatment plan, treatment alternatives and goals were discussed and mutually agreed upon: by patient  Sellers 10/02/2016, 9:42 AM

## 2016-10-02 NOTE — Progress Notes (Signed)
NT with pt in BR when pt c/o severe pain to head and chest, pt very weak, 2+assist to w/c then to bed, VSS, CBG 142, pt states she has had these "episodes" the last few months and that they may be seizures, she is unsure, RRT called to evaluate pt, O2 2L applied, EKG ordered, Call to pt dtr and to Zella Ball, NP on call for Dr Letta Pate, IVT paged for stat IV restart. By 2000, pt is more restful, will continue to monitor, pt's dtr at bedside. Pt request her klonopin now instead of 2200, meds given at 2020. VSS.

## 2016-10-03 ENCOUNTER — Inpatient Hospital Stay (HOSPITAL_COMMUNITY): Payer: Medicare Other

## 2016-10-03 ENCOUNTER — Inpatient Hospital Stay (HOSPITAL_COMMUNITY): Payer: Medicare Other | Admitting: Speech Pathology

## 2016-10-03 ENCOUNTER — Inpatient Hospital Stay (HOSPITAL_COMMUNITY): Payer: Medicare Other | Admitting: Occupational Therapy

## 2016-10-03 ENCOUNTER — Ambulatory Visit: Payer: Self-pay | Admitting: Diagnostic Neuroimaging

## 2016-10-03 LAB — BASIC METABOLIC PANEL
Anion gap: 10 (ref 5–15)
BUN: 15 mg/dL (ref 6–20)
CALCIUM: 9.7 mg/dL (ref 8.9–10.3)
CHLORIDE: 109 mmol/L (ref 101–111)
CO2: 22 mmol/L (ref 22–32)
CREATININE: 1.01 mg/dL — AB (ref 0.44–1.00)
GFR, EST NON AFRICAN AMERICAN: 53 mL/min — AB (ref >60)
Glucose, Bld: 88 mg/dL (ref 65–99)
Potassium: 4 mmol/L (ref 3.5–5.1)
SODIUM: 141 mmol/L (ref 135–145)

## 2016-10-03 MED ORDER — DEXTROSE 5 % IV SOLN
1000.0000 mg | Freq: Once | INTRAVENOUS | Status: AC
  Administered 2016-10-03: 1000 mg via INTRAVENOUS
  Filled 2016-10-03: qty 10

## 2016-10-03 MED ORDER — DIVALPROEX SODIUM ER 500 MG PO TB24
500.0000 mg | ORAL_TABLET | Freq: Every day | ORAL | Status: DC
  Administered 2016-10-03 – 2016-10-11 (×9): 500 mg via ORAL
  Filled 2016-10-03 (×9): qty 1

## 2016-10-03 NOTE — Progress Notes (Signed)
Social Work Patient ID: Savannah Travis, female   DOB: [birth year], 76 y.o.   MRN: [id number]   Met with pt and spoke with daughter via telephone to discuss team conference goals supervision level and discharge target 1/19. Aware of the consults done with cardiology and neurology investigating reason for her "spells". Daughter will make an appointment with pt's neurologist now has a discharge date. Discussed having she or her Dad come in For education prior to discharge home. Daughter will talk with Dad about this. Both in agreement with this plan. Will work toward discharge next Friday.

## 2016-10-03 NOTE — Progress Notes (Signed)
Social Work Elease Hashimoto, LCSW Social Worker Signed   Patient Care Conference Date of Service: 10/03/2016  1:13 PM      Hide copied text Hover for attribution information Inpatient RehabilitationTeam Conference and Plan of Care Update Date: 10/03/2016   Time: 10:40 AM      Patient Name: Savannah Travis      Medical Record Number: [id number]  Date of Birth: 1941-07-11 Sex: Female         Room/Bed: 4W07C/4W07C-01 Payor Info: Payor: MEDICARE / Plan: MEDICARE PART A AND B / Product Type: *No Product type* /     Admitting Diagnosis: CVA  Admit Date/Time:  09/25/2016  5:27 PM Admission Comments: No comment available    Primary Diagnosis:  <principal problem not specified> Principal Problem: <principal problem not specified>       Patient Active Problem List    Diagnosis Date Noted  . Right hemiparesis ([place]) 09/28/2016  . Gait disturbance, post-stroke 09/28/2016  . Small vessel disease, cerebrovascular 09/25/2016  . Transient alteration of awareness    . Gait disorder    . UTI due to Klebsiella species 09/23/2016  . Pre-diabetes 09/23/2016  . Spinal stenosis of lumbar region without neurogenic claudication    . Cerebrovascular accident (CVA) due to thrombosis of left middle cerebral artery ([place]) 09/22/2016  . Dysarthria 09/21/2016  . Unresponsiveness 09/20/2016  . Hypokalemia 09/20/2016  . Debility 09/20/2016  . Dehydration 09/20/2016  . Prolonged QT interval 09/20/2016  . Hypertension 09/20/2016  . Acute encephalopathy 09/20/2016      Expected Discharge Date: Expected Discharge Date: 10/12/16   Team Members Present: Physician leading conference: Dr. Alysia Penna Social Worker Present: Ovidio Kin, LCSW Nurse Present: Heather Roberts, RN PT Present: Georjean Mode, Rory Percy, PT OT Present: Clyda Greener, OT SLP Present: Windell Moulding, SLP PPS Coordinator present : Daiva Nakayama, RN, CRRN       Current Status/Progress Goal Weekly Team Focus  Medical   Patient has  spells may consist of headache as well as left-sided chest pain. She has pain evaluated both on inpatient and outpatient basis. Musculoskeletal.  Maintain medical stability during rehabilitation stay, coordinate with other providers  Further delineate nature  of patient's spells.   Bowel/Bladder   Incontinent at times,wears brief, some urgency with urine.  last bm was 1/6, pt refused sorbitol today due to therapy  continent of b/b with min assist  monitor q shift give prns for bm every 2-3 days   Swallow/Nutrition/ Hydration             ADL's   supervision for UB bathing with min assist for UB dressing.  Min assist for LB bathing with mod assist for LB dressing sit to stand.  Min assist for toilet transfers using the RW with min assist for clothing management.  Increased apraxia noted with RUE.  Uses functionally at a diminished level for all self feeding with increased time.    supervision overall  selfcare retraining, balance retraining, neuromuscular re-education, therapeutic exercise, pt/family education   Mobility   gait limited by L knee pain; mod assist bed mobility, min assist stand pivot transfers with Rw, min assist gait x 25' recently due to knee pain; x 70' on 09/30/16  downgrading bed mobility goals to min assist; supervision basic and car transfers, gait x 200' in community setting and up/down 12 steps  activity tolerance, L knee pain management, neuro re-ed, standing balance, transfers and locomotion   Communication   mild dysarthria and word finding  impairment, min assist    mod I   education and carryover of compensatory strategies for speech intelligibility    Safety/Cognition/ Behavioral Observations           Pain   c/o pain were mild r/t arthrits, helped by voltaren gel until 1/6, Saturday, when pt has "spell" she states she has been having for a couple months with "lightning bolt" of pain to forehead that sometimes travels to her chest.  Event also occured on 1/8 in evening  pain  level below 3 with min assist  montior pain q shift and prn   Skin   no s/s of breakdown  no skin breakdown while at inpatient rehab          *See Care Plan and progress notes for long and short-term goals.   Barriers to Discharge: Continues require physical assistance for ADLs and mobility   Possible Resolutions to Barriers:  Continue rehabilitation program, see above   Discharge Planning/Teaching Needs:  Home with husband who has medical issues of his own and can only provide supervision level. Daughter will be checking on couple times a week      Team Discussion:  Goals supervision level, incontinent of bladder was prior to admission. Neuro and Cardiologist consulted for looking into reason for her spells. EKG normal and TEE's done. AFO order for evaluation of left knee support. Pt fatigued after spell and needs to rest. Family education next week.  Revisions to Treatment Plan:  DC Friday 1/19    Continued Need for Acute Rehabilitation Level of Care: The patient requires daily medical management by a physician with specialized training in physical medicine and rehabilitation for the following conditions: Daily direction of a multidisciplinary physical rehabilitation program to ensure safe treatment while eliciting the highest outcome that is of practical value to the patient.: Yes Daily medical management of patient stability for increased activity during participation in an intensive rehabilitation regime.: Yes Daily analysis of laboratory values and/or radiology reports with any subsequent need for medication adjustment of medical intervention for : Neurological problems;Other   Manali Mcelmurry, Gardiner Rhyme 10/03/2016, 1:13 PM      Elease Hashimoto, LCSW Social Worker Signed   Patient Care Conference Date of Service: 09/26/2016  1:22 PM      Hide copied text Hover for attribution information Inpatient RehabilitationTeam Conference and Plan of Care Update Date: 09/26/2016   Time: 10:45 AM       Patient Name: Savannah Travis      Medical Record Number: [id number]  Date of Birth: 09/11/1941 Sex: Female         Room/Bed: 4W07C/4W07C-01 Payor Info: Payor: MEDICARE / Plan: MEDICARE PART A AND B / Product Type: *No Product type* /     Admitting Diagnosis: CVA  Admit Date/Time:  09/25/2016  5:27 PM Admission Comments: No comment available    Primary Diagnosis:  <principal problem not specified> Principal Problem: <principal problem not specified>       Patient Active Problem List    Diagnosis Date Noted  . Small vessel disease, cerebrovascular 09/25/2016  . Transient alteration of awareness    . Gait disorder    . UTI due to Klebsiella species 09/23/2016  . Pre-diabetes 09/23/2016  . Spinal stenosis of lumbar region without neurogenic claudication    . Cerebrovascular accident (CVA) due to thrombosis of left middle cerebral artery ([place]) 09/22/2016  . Dysarthria 09/21/2016  . Unresponsiveness 09/20/2016  . Hypokalemia 09/20/2016  . Debility 09/20/2016  . Dehydration 09/20/2016  .  Prolonged QT interval 09/20/2016  . Hypertension 09/20/2016  . Acute encephalopathy 09/20/2016      Expected Discharge Date:     Team Members Present: Physician leading conference: Dr. Alysia Penna Social Worker Present: Ovidio Kin, LCSW Nurse Present: Heather Roberts, RN PT Present: Georjean Mode, Rory Percy, PT OT Present: Clyda Greener, OT SLP Present: Windell Moulding, SLP PPS Coordinator present : Daiva Nakayama, RN, CRRN       Current Status/Progress Goal Weekly Team Focus  Medical   Patient initially with weakness of all 4 limbs. Now right-sided weakness. Dysarthria and aphasia are noted.  Improvement indication, maintain medical stability during rehabilitation stay  Initiate rehabilitation program   Bowel/Bladder     bladder urgency   urgency with urine-MD pursuing this     Swallow/Nutrition/ Hydration             ADL's     eval pending        Mobility     eval pending          Communication     Speech evaluating        Safety/Cognition/ Behavioral Observations           Pain        pain managed-monitor     Skin        na         *See Care Plan and progress notes for long and short-term goals.   Barriers to Discharge:  requires physical assistance for all self-care and mobility   Possible Resolutions to Barriers:  Continue rehabilitation program   Discharge Planning/Teaching Needs:    Home with husband who can not assist with care, can provide supervision level. Daughter involved and assists twice weekly.     Team Discussion:  Was unable to conference due to not whole therapy team has seen and evaluated. Possible goals supervision level and est length of stay 2 weeks.  Revisions to Treatment Plan:  New eval    Continued Need for Acute Rehabilitation Level of Care: The patient requires daily medical management by a physician with specialized training in physical medicine and rehabilitation for the following conditions: Daily direction of a multidisciplinary physical rehabilitation program to ensure safe treatment while eliciting the highest outcome that is of practical value to the patient.: Yes Daily medical management of patient stability for increased activity during participation in an intensive rehabilitation regime.: Yes Daily analysis of laboratory values and/or radiology reports with any subsequent need for medication adjustment of medical intervention for : Neurological problems   Elease Hashimoto 09/26/2016, 1:22 PM       Patient ID: [name], female   DOB: 07-22-1941, 76 y.o.   MRN: [id number]

## 2016-10-03 NOTE — Progress Notes (Signed)
Occupational Therapy Weekly Progress Note  Patient Details  Name: Savannah Travis MRN: [id number] Date of Birth: May 09, 1941  Beginning of progress report period: September 26, 2016 End of progress report period: October 03, 2016  Today's Date: 10/03/2016 OT Individual Time: 8466-5993 OT Individual Time Calculation (min): 78 min     Patient has met 5 of 5 short term goals.  Savannah Travis is currently at a min assist level for functional transfers and selfcare sit to stand.  Use of AE for washing feet and for some LB dressing tasks.  She continues to demonstrate decreased RUE functional use secondary to motor planning and weakness but uses functionally in therapy at a diminished level with min assist to supervision.  Decreased ability to advance the RLE during transfers, with pt taking small steps.  Still with limited endurance, requiring increased time to complete most ADL tasks.  Feel she is progressing steadily and can achieve established LTGs set at supervision with another 7-9 days of CIR level therapy.   Patient continues to demonstrate the following deficits: muscle weakness, impaired timing and sequencing, motor apraxia and decreased coordination and decreased sitting balance, decreased postural control, hemiplegia and decreased balance strategies and therefore will continue to benefit from skilled OT intervention to enhance overall performance with BADL and Reduce care partner burden.  Patient progressing toward long term goals..  Continue plan of care.  OT Short Term Goals Week 2:  OT Short Term Goal 1 (Week 2): Continue working on supervision level goals for discharge.     Skilled Therapeutic Interventions/Progress Updates:    Savannah Travis worked on selfcare tasks during session including shower and dressing.  She was able to transfer into the shower with min assist using the RW, ambulating from her wheelchair at bedside.  Decreased weightshifts and advancement of the RLE noted with each step.   She was able to complete bathing sit to stand with min assist.  LH sponge issued to increase independence for washing feet.  Dressing completed sit to stand at the sink.  She still needs mod instructional cueing to begin dressing the impaired right UE and LE, following hemi dressing techniques.  Utilized the reacher for threading right leg in pants this session because she started with the wrong one initially.  Still needs min assist for pulling pants and brief up over her hips as well.  Will try elastic shoe strings or slip on shoes as pt cannot tie shoes secondary to decreased flexibility at this time.  Pt left in wheelchair with call button and phone in reach.  Very fatigued during session overall and pt also reporting this.  Noted she would close eyes at times for no reason but no noted delay in processing or executing commands.   Therapy Documentation Precautions:  Precautions Precautions: Fall Restrictions Weight Bearing Restrictions: No  Vital Signs: Therapy Vitals Pulse Rate: 92 BP: (!) 141/67 Patient Position (if appropriate): Sitting Oxygen Therapy SpO2: 96 % O2 Device: Not Delivered Pain: Pain Assessment Pain Assessment: No/denies pain Pain Score: 4  Pain Location: Knee Pain Orientation: Left 2nd Pain Site Pain Intervention(s): Medication (See eMAR) ADL: See Function Navigator for Current Functional Status.   Therapy/Group: Individual Therapy  Trevin Gartrell OTR/L 10/03/2016, 10:43 AM

## 2016-10-03 NOTE — Progress Notes (Signed)
Physical Therapy Weekly Progress Note  Patient Details  Name: Savannah Travis MRN: [id number] Date of Birth: 1941-09-01  Beginning of progress report period: 09/26/16 End of progress report period: 10/03/16  Today's Date: 10/03/2016 PT Individual Time: 0800-0900; 1300-1330 PT Individual Time Calculation (min): 60 min , 30 min  Patient has met 2 of 4 short term goals. Bed mobility goal has been downgraded, as pt stated her husband helped her sit up PTA, and body habitus limits her.    Patient continues to demonstrate the following deficits L knee joint pain, decreased cardiorespiratoy endurance and impaired timing and sequencing, unbalanced muscle activation and decreased motor planning and therefore will continue to benefit from skilled PT intervention to increase functional independence with mobility.  Patient not progressing toward long term goals.  See goal revision..  Continue plan of care.  PT Short Term GoalsWeek 1:  PT Short Term Goal 1 (Week 1): pt will move supine> sitting with min assist PT Short Term Goal 2 (Week 1): pt will transfer bed>< w/c with mod assist PT Short Term Goal 3 (Week 1): pt will propel w/c x 100' with min assist for strengthening PT Short Term Goal 4 (Week 1): pt will perform gait x 50' with mod assist  Skilled Therapeutic Interventions/Progress Updates:   tx 1:  PT threaded pants on feet in supine; pt pulled up partially in supine, then rolled r and sat up with mod cues ,R side of bed to sit EOB , then standin with supervison to pull up pants.   Pt coughed x 2 when drinking hot tea. Gait in room with Rw to toilet.  Extra time to manage pants and pt unable to pull pants completely down in back in a timely manner before sitting. L knee pain limiting her this session, despite Voltaren at start of session.   tx 2:  No pain at rest.  Gait with RW on level tile x 75' with close supervision. Up/down 8 low steps 2 rails with min guard assist.  Pt descended backwards.  Returned pt to bed as she was awaiting EEG. PT asked dtr and husband to measure pt's bed at home.    Therapy Documentation Precautions:  Precautions Precautions: Fall Restrictions Weight Bearing Restrictions: No Pain: Pain Assessment Pain Assessment: No/denies pain Pain Score: 4  Pain Location: Knee Pain Orientation: Left 2nd Pain Site Pain Intervention(s): Medication (See eMAR)     See Function Navigator for Current Functional Status.  Therapy/Group: Individual Therapy  [name] 10/03/2016, 10:16 AM

## 2016-10-03 NOTE — Progress Notes (Signed)
Subjective/Complaints: Had a long shower, no further "spells" Per OT, pt also exhibiting apraxia LUE   ROS  Denies N/V/D, no CP or SOB Objective: Vital Signs: Blood pressure 124/61, pulse 80, temperature 98.1 F (36.7 C), temperature source Oral, resp. rate 18, height _0  (1.422 m), weight 91.9 kg (202 lb 8 oz), SpO2 97 %. No results found. Results for orders placed or performed during the hospital encounter of 09/25/16 (from the past 72 hour(s))  Glucose, capillary     Status: Abnormal   Collection Time: 10/01/16  7:40 PM  Result Value Ref Range   Glucose-Capillary 142 (H) 65 - 99 mg/dL     HEENT: normal and +dysarthria Cardio: RRR and No murmurs or extra sounds, Tenderness to palpation along the costochondral  junction on the left side Resp: CTA B/L and Unlabored GI: BS positive and Nontender, nondistended Extremity:  Pulses positive and No Edema Skin:   Intact Neuro: Alert/Oriented, Cranial Nerve II-XII normal, Normal Sensory, Abnormal Motor Motor strength is 3 minus in the right deltoid, bicep, triceps, grip,4 minus in the right hip flexor, knee extensor, ankle dorsiflexor.4/5 in left deltoid, biceps, triceps, grip, hip flexor, knee extensor, ankle dorsi flexor, Abnormal FMC Ataxic/ dec FMC, Tone:  Within Normal Limits, Dysarthric and Aphasic Musc/Skel:  Other Limited right shoulder range of motion without pain. Gen. no acute distress   Assessment/Plan: 1. Functional deficits secondary to left corona radiata infarct, right hemiparesis, aphasia, dysarthria which require 3+ hours per day of interdisciplinary therapy in a comprehensive inpatient rehab setting. Physiatrist is providing close team supervision and 24 hour management of active medical problems listed below. Physiatrist and rehab team continue to assess barriers to discharge/monitor patient progress toward functional and medical goals. FIM: Function - Bathing Position: Shower Body parts bathed by patient: Right  arm, Left arm, Chest, Abdomen, Front perineal area, Right upper leg, Left upper leg, Buttocks Body parts bathed by helper: Right lower leg, Left lower leg, Back Bathing not applicable: Right lower leg, Left lower leg Assist Level: Touching or steadying assistance(Pt > 75%)  Function- Upper Body Dressing/Undressing What is the patient wearing?: Bra, Pull over shirt/dress Bra - Perfomed by patient: Thread/unthread right bra strap, Thread/unthread left bra strap Bra - Perfomed by helper: Hook/unhook bra (pull down sports bra) Pull over shirt/dress - Perfomed by patient: Thread/unthread right sleeve, Thread/unthread left sleeve, Put head through opening, Pull shirt over trunk Assist Level: Set up Set up : To obtain clothing/put away Function - Lower Body Dressing/Undressing What is the patient wearing?: Pants, Non-skid slipper socks Position: Other (comment) (toilet) Underwear - Performed by patient: Thread/unthread right underwear leg, Thread/unthread left underwear leg Underwear - Performed by helper: Pull underwear up/down Pants- Performed by patient: Thread/unthread right pants leg, Thread/unthread left pants leg Pants- Performed by helper: Pull pants up/down Non-skid slipper socks- Performed by helper: Don/doff right sock, Don/doff left sock Shoes - Performed by helper: Don/doff right shoe, Don/doff left shoe TED Hose - Performed by helper: Don/doff right TED hose, Don/doff left TED hose Assist for footwear: Dependant Assist for lower body dressing:  (mod A)  Function - Toileting Toileting steps completed by patient: Adjust clothing prior to toileting, Performs perineal hygiene Toileting steps completed by helper: Adjust clothing after toileting Toileting Assistive Devices: Grab bar or rail Assist level:  (mod A)  Function - Air cabin crew transfer assistive device: Elevated toilet seat/BSC over toilet, Grab bar Assist level to toilet: Maximal assist (Pt 25 - 49%/lift and  lower) Assist  level from toilet: Maximal assist (Pt 25 - 49%/lift and lower) Assist level to bedside commode (at bedside): Touching or steadying assistance (Pt > 75%) Assist level from bedside commode (at bedside): Touching or steadying assistance (Pt > 75%)  Function - Chair/bed transfer Chair/bed transfer activity did not occur: N/A Chair/bed transfer method: Ambulatory Chair/bed transfer assist level: Touching or steadying assistance (Pt > 75%) Chair/bed transfer assistive device: Armrests, Walker Chair/bed transfer details: Verbal cues for safe use of DME/AE, Verbal cues for technique  Function - Locomotion: Wheelchair Will patient use wheelchair at discharge?: Yes Type: Manual Max wheelchair distance: 70f  Assist Level: Moderate assistance (Pt 50 - 74%) Assist Level: Moderate assistance (Pt 50 - 74%) Function - Locomotion: Ambulation Assistive device: Walker-rolling Max distance: 15 Assist level: Touching or steadying assistance (Pt > 75%) Assist level: Touching or steadying assistance (Pt > 75%) Walk 50 feet with 2 turns activity did not occur: Safety/medical concerns (L knee painful, R knee weak) Assist level: Touching or steadying assistance (Pt > 75%) Walk 10 feet on uneven surfaces activity did not occur: Safety/medical concerns (L knee painful, R knee weak) Assist level: Moderate assist (Pt 50 - 74%)  Function - Comprehension Comprehension: Auditory Comprehension assist level: Follows basic conversation/direction with extra time/assistive device  Function - Expression Expression: Verbal Expression assist level: Expresses basic 75 - 89% of the time/requires cueing 10 - 24% of the time. Needs helper to occlude trach/needs to repeat words.  Function - Social Interaction Social Interaction assist level: Interacts appropriately 90% of the time - Needs monitoring or encouragement for participation or interaction.  Function - Problem Solving Problem solving assist level:  Solves basic 90% of the time/requires cueing < 10% of the time  Function - Memory Memory assist level: Recognizes or recalls 50 - 74% of the time/requires cueing 25 - 49% of the time Patient normally able to recall (first 3 days only): Current season, Location of own room, Staff names and faces, That he or she is in a hospital   Medical Problem List and Plan: 1.Decreased functional mobility with lower extremity weakness/altered mental status/question syncopesecondary to left corona radiata infarct -CIR PT OT speech , Team conference today please see physician documentation under team conference tab, met with team face-to-face to discuss problems,progress, and goals. Formulized individual treatment plan based on medical history, underlying problem and comorbidities. 2. DVT Prophylaxis/Anticoagulation: SCDs. Monitor for any signs of DVT 3. Pain Management/headaches: Topamax 25 mg twice a day, chest pain, musculoskeletal chest wall, sports cream, has had cardiac workup for chest pain which was negative. Repeat EKG times 2 negative. No pain during physical therapy. Left knee pain, advanced osteoarthritis, medial compartment, as well as patellofemoral, moderate at the lateral compartment. Cont Voltaren gel if not helpful may order a knee orthosis 4. Mood: Ludiomil 50 mg daily at bedtime.Klonopin 1 mg daily at bedtime---pt would like this scheduled 5. Neuropsych: This patient iscapable of making decisions on herown behalf. 6. Skin/Wound Care: Routine skin checks 7. Fluids/Electrolytes/Nutrition: Routine I&O with follow-up chemistries upon admit. Encourage PO, 1000 mL fluid intake, 85-100% meal intake. 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 Continue Trileptal 300 mg twice a day           -most recent spell just involved complaints of pain. No abnormal movements, no change in mental status. 9.Hypertension. Norvasc 5 mg daily. Monitor with increased  mobility 10.Klebsiella UTI. Ceftin initiated 09/24/2016 transitioning from Rocephin. May have spastic neurogenic bladder in addition to UTI. Hold off on  anticholinergic agents until UTI treated, PVR 0-30 cc 11.Hyperlipidemia. Lipitor 12.History of gout. Allopurinol 300 mg daily. Monitor for any gout flareups. No pain at present    13.  Pre renal azotemia, oral intake, improving  repeat bmet today 14.  HypoK on potassium, follow-up potassium level Today LOS (Days) 8 A FACE TO FACE EVALUATION WAS PERFORMED  Muhamad Serano E 10/03/2016, 9:29 AM

## 2016-10-03 NOTE — Progress Notes (Signed)
EEG Completed; Results Pending  

## 2016-10-03 NOTE — Patient Care Conference (Signed)
Inpatient RehabilitationTeam Conference and Plan of Care Update Date: 10/03/2016   Time: 10:40 AM    Patient Name: Savannah Travis      Medical Record Number: [id number]  Date of Birth: [birth year] Sex: Female         Room/Bed: 4W07C/4W07C-01 Payor Info: Payor: MEDICARE / Plan: MEDICARE PART A AND B / Product Type: *No Product type* /    Admitting Diagnosis: CVA  Admit Date/Time:  09/25/2016  5:27 PM Admission Comments: No comment available   Primary Diagnosis:  <principal problem not specified> Principal Problem: <principal problem not specified>  Patient Active Problem List   Diagnosis Date Noted  . Right hemiparesis ([place]) 09/28/2016  . Gait disturbance, post-stroke 09/28/2016  . Small vessel disease, cerebrovascular 09/25/2016  . Transient alteration of awareness   . Gait disorder   . UTI due to Klebsiella species 09/23/2016  . Pre-diabetes 09/23/2016  . Spinal stenosis of lumbar region without neurogenic claudication   . Cerebrovascular accident (CVA) due to thrombosis of left middle cerebral artery ([place]) 09/22/2016  . Dysarthria 09/21/2016  . Unresponsiveness 09/20/2016  . Hypokalemia 09/20/2016  . Debility 09/20/2016  . Dehydration 09/20/2016  . Prolonged QT interval 09/20/2016  . Hypertension 09/20/2016  . Acute encephalopathy 09/20/2016    Expected Discharge Date: Expected Discharge Date: 10/12/16  Team Members Present: Physician leading conference: Dr. Alysia Penna Social Worker Present: Ovidio Kin, LCSW Nurse Present: Heather Roberts, RN PT Present: Georjean Mode, Rory Percy, PT OT Present: Clyda Greener, OT SLP Present: Windell Moulding, SLP PPS Coordinator present : Daiva Nakayama, RN, CRRN     Current Status/Progress Goal Weekly Team Focus  Medical   Patient has spells may consist of headache as well as left-sided chest pain. She has pain evaluated both on inpatient and outpatient basis. Musculoskeletal.  Maintain medical stability during rehabilitation stay,  coordinate with other providers  Further delineate nature  of patient's spells.   Bowel/Bladder   Incontinent at times,wears brief, some urgency with urine.  last bm was 1/6, pt refused sorbitol today due to therapy  continent of b/b with min assist  monitor q shift give prns for bm every 2-3 days   Swallow/Nutrition/ Hydration             ADL's   supervision for UB bathing with min assist for UB dressing.  Min assist for LB bathing with mod assist for LB dressing sit to stand.  Min assist for toilet transfers using the RW with min assist for clothing management.  Increased apraxia noted with RUE.  Uses functionally at a diminished level for all self feeding with increased time.    supervision overall  selfcare retraining, balance retraining, neuromuscular re-education, therapeutic exercise, pt/family education   Mobility   gait limited by L knee pain; mod assist bed mobility, min assist stand pivot transfers with Rw, min assist gait x 25' recently due to knee pain; x 70' on 09/30/16  downgrading bed mobility goals to min assist; supervision basic and car transfers, gait x 200' in community setting and up/down 12 steps  activity tolerance, L knee pain management, neuro re-ed, standing balance, transfers and locomotion   Communication   mild dysarthria and word finding impairment, min assist    mod I   education and carryover of compensatory strategies for speech intelligibility    Safety/Cognition/ Behavioral Observations            Pain   c/o pain were mild r/t arthrits, helped by voltaren gel until 1/6,  Saturday, when pt has "spell" she states she has been having for a couple months with "lightning bolt" of pain to forehead that sometimes travels to her chest.  Event also occured on 1/8 in evening  pain level below 3 with min assist  montior pain q shift and prn   Skin   no s/s of breakdown  no skin breakdown while at inpatient rehab         *See Care Plan and progress notes for long and  short-term goals.  Barriers to Discharge: Continues require physical assistance for ADLs and mobility    Possible Resolutions to Barriers:  Continue rehabilitation program, see above    Discharge Planning/Teaching Needs:  Home with husband who has medical issues of his own and can only provide supervision level. Daughter will be checking on couple times a week      Team Discussion:  Goals supervision level, incontinent of bladder was prior to admission. Neuro and Cardiologist consulted for looking into reason for her spells. EKG normal and TEE's done. AFO order for evaluation of left knee support. Pt fatigued after spell and needs to rest. Family education next week.  Revisions to Treatment Plan:  DC Friday 1/19   Continued Need for Acute Rehabilitation Level of Care: The patient requires daily medical management by a physician with specialized training in physical medicine and rehabilitation for the following conditions: Daily direction of a multidisciplinary physical rehabilitation program to ensure safe treatment while eliciting the highest outcome that is of practical value to the patient.: Yes Daily medical management of patient stability for increased activity during participation in an intensive rehabilitation regime.: Yes Daily analysis of laboratory values and/or radiology reports with any subsequent need for medication adjustment of medical intervention for : Neurological problems;Other  Deyani Hegarty, Gardiner Rhyme 10/03/2016, 1:13 PM

## 2016-10-03 NOTE — Progress Notes (Addendum)
STROKE TEAM PROGRESS NOTE   SUBJECTIVE (INTERVAL HISTORY) Stroke team was called to see the patient again because she is having recurrent episodes of transient headache, chest pain with altered awareness. Vital signs have been stable. Patient informs me that she's been having these episodes for the last several years in fact she had extensive workup in [place] as well as was seen by Dr. Forrest Moron in [facility] office. Imaging studies of the brain and C-spine as well as EEG were unremarkable. She had echocardiogram as well as cardiac stress test which were normal. She had a 30 day heart monitor in [place] which was apparently normal but we do not have those results. She is fully awake during these episodes but does feel tired and drained after the episodes. She states that time she cannot remember the whole episode. 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 Patient does give a prior history of migraine headaches but states the headaches she gets his episodes are different from her prior migraines. She has another EEG ordered today which is pending  OBJECTIVE Temp:  [97.5 F (36.4 C)-98.1 F (36.7 C)] 98.1 F (36.7 C) (01/10 0545) Pulse Rate:  [80-92] 92 (01/10 1010) Resp:  [18] 18 (01/10 0545) BP: (124-141)/(61-67) 141/67 (01/10 1010) SpO2:  [96 %-97 %] 96 % (01/10 1010) Weight:  [202 lb 8 oz (91.9 kg)] 202 lb 8 oz (91.9 kg) (01/10 0545)  CBC:  No results for input(s): WBC, NEUTROABS, HGB, HCT, MCV, PLT in the last 168 hours.  Basic Metabolic Panel:   Recent Labs Lab 09/29/16 0839  NA 139  K 3.4*  CL 109  CO2 21*  GLUCOSE 128*  BUN 14  CREATININE 0.93  CALCIUM 9.1    Lipid Panel:     Component Value  Date/Time   CHOL 156 09/22/2016 0433   TRIG 111 09/22/2016 0433   HDL 47 09/22/2016 0433   CHOLHDL 3.3 09/22/2016 0433   VLDL 22 09/22/2016 0433   LDLCALC 87 09/22/2016 0433   HgbA1c:  Lab Results  Component Value Date   HGBA1C 5.6 09/22/2016   Urine Drug Screen: No results found for: LABOPIA, COCAINSCRNUR, LABBENZ, AMPHETMU, THCU, LABBARB    IMAGING I have personally reviewed the radiological images below and agree with the radiology interpretations.  Ct Head Wo Contrast 09/11/2016 IMPRESSION: No acute intracranial pathology.   Mr Jeri Cos Wo Contrast 09/21/2016 IMPRESSION: 1. Acute infarct in the central left corona radiata, nonhemorrhagic. 2. Focal high-grade left M1 segment stenosis. Elsewhere negative intracranial MRA. 3. 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   Mr Cervical Spine Wo Contrast 09/22/2016 IMPRESSION: No acute or likely significant finding in the cervical region. No change since the study of November. Degenerative facet arthropathy left worse than right. Mild disc degeneration. No compressive stenosis of the canal or foramina. No primary cord lesion.   Mr Thoracic Spine Wo Contrast 09/23/2016 IMPRESSION: Thoracic scoliosis. Shallow disc protrusion at T2-3 without significant neural compression. No other disc protrusions, spinal or foraminal stenosis in the thoracic spine. Normal MR appearance of the thoracic spinal cord.   Mr Lumbar Spine Wo Contrast 09/22/2016 IMPRESSION: No likely significant finding at L3-4 or above. Old compression fracture at L5 which is healed. Posterior  bowing of the posterosuperior margin of the vertebral body. Mild bulging of the L4-5 disc. In combination with the L5 bowing hand with bilateral facet and ligamentous hypertrophy, there is moderate stenosis at this level that could possibly cause neural compression. L5-S1 facet arthropathy with gaping, fluid-filled facet joints. Mild bulging of  the disc. Mild narrowing of the subarticular lateral recesses and neural foramina. This appearance would likely worsen with standing or flexion based on the appearance of the facet arthropathy. 5   TTE - Left ventricle: The cavity size was normal. Systolic function was   normal. The estimated ejection fraction was in the range of 60%   to 65%. Wall motion was normal; there were no regional wall   motion abnormalities. Doppler parameters are consistent with   abnormal left ventricular relaxation (grade 1 diastolic   dysfunction) Impressions: - Compared to the prior study, there has been no significant   interval change.  Carotid Doppler - 08/08/2016 - normal carotid and vertebral arteries.  EEG - The EEG is normal in awake and drowsy state.  30 day cardia event monitoring - summer 2017 in [place] - record on request   PHYSICAL EXAM  Temp:  [97.5 F (36.4 C)-98.1 F (36.7 C)] 98.1 F (36.7 C) (01/10 0545) Pulse Rate:  [80-92] 92 (01/10 1010) Resp:  [18] 18 (01/10 0545) BP: (124-141)/(61-67) 141/67 (01/10 1010) SpO2:  [96 %-97 %] 96 % (01/10 1010) Weight:  [202 lb 8 oz (91.9 kg)] 202 lb 8 oz (91.9 kg) (01/10 0545)  General - Well nourished, well developed, in no apparent distress.  Ophthalmologic - Fundi not visualized due to noncooperation.  Cardiovascular - Regular rate and rhythm.  Mental Status -  Level of arousal and orientation to time, place, and person were intact. Language including expression, naming, repetition, comprehension was assessed and found intact.  .  Cranial Nerves II - XII - II - Visual field intact OU. III, IV, VI - Extraocular movements intact. V - Facial sensation intact bilaterally. VII - mild right facial droop. VIII - Hearing & vestibular intact bilaterally. X - Palate elevates symmetrically. XI - Chin turning & shoulder shrug intact bilaterally. XII - Tongue protrusion to the right  Motor Strength - The patient's strength was 4/5 LUE and  LLE, but 4/5 RUE and 4-/5 RLE.  Bulk was normal and fasciculations were absent.   Motor Tone - Muscle tone was assessed at the neck and appendages and was normal.  Reflexes - The patient's reflexes were 1+ in all extremities and she had no pathological reflexes.  Sensory - Light touch, temperature/pinprick were assessed and were symmetrical.    Coordination - The patient had normal movements in the hands with no ataxia or dysmetria.  Tremor was absent.  Gait and Station - deferred.   ASSESSMENT/PLAN Ms. Savannah Travis is a 76 y.o. female with history of hypertension, breast cancer, hyperlipidemia and depression  presenting with unresponsive episodes, dysphagia, confusion, and progressive weakness. She did not receive IV t-PA due to unknown time of onset.  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 Possibly computed migraine versus complex partial seizures.  Resultant right sided weakness and mild dysarthria now improving  MRI - Acute infarct in the central left corona radiata,  MRA - Focal high-grade left M1 segment stenosis.  Carotid Doppler - 08/08/2016 - normal carotid and vertebral arteries.  EEG  normal  2D Echo - EF 60%  to 65%  30 day cardiac event monitoring done in 02/2016 in [place], [place]. Record on request  LDL - 87  HgbA1c pending  VTE prophylaxis - SCDs Diet heart healthy/carb modified Room service appropriate? Yes; Fluid consistency: Thin  aspirin 81 mg daily prior to admission, now on clopidogrel 75 mg daily and ASA 325mg  daily. Continue DAPT for 3 months and then plavix alone due to intracranial stenosis.   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 Leta Baptist at [facility]  EEG negative  On trileptal   300mg  bid   30 day cardiac event monitoring done last summer in [place] - record on request  Walking difficulty  Progressive walking difficulty  MRI C/T/L spine unrevealing  Denies significant back pain  Complicated by left CR infarct with right sided hemiparesis  CIR recommended  Follow up with Dr. Leta Baptist as outpt - may consider EMG/NCS  Migraine   On topamax 25mg  twice daily Plan to DC  Pt HA did not improve  Plan add depacon 1 gm x1 now and depakote ER 500 mg daily  Follow up with Dr. Leta Baptist as outpt  Hypertension  Stable  Permissive hypertension (OK if < 220/120) but gradually normalize in 5-7 days  Long-term BP goal normotensive  Hyperlipidemia  Home meds: Lipitor 40 mg daily resumed in hospital  LDL 87, goal < 70  Continue statin at discharge  Other Stroke Risk Factors  Advanced age  Obesity, Body mass index is 45.4 kg/m., recommend weight loss, diet and exercise as appropriate    Other Active Problems  UTI - UA WBC 6-30 - on ceftin  Hospital day # 8  I have personally examined this patient, reviewed notes, independently viewed imaging studies, participated in medical decision making and plan of care.ROS completed by me personally and pertinent positives fully documented  I have made any additions or clarifications directly to the above note. Recurrent episodes are of unclear etiology possibly complex partial seizures versus complicated migraine. Recommend IV Depacon 1 g now followed by Depakote ER 500 mg daily. Discontinue Topamax. Continue Trileptal. Check EEG. Long discussion with the patient at the bedside and answered questions. Later than 50% time during this 35 minute visit was spent in counseling and coordination of care about her recent stroke, recurrent episodes and answering questions .  Discussed with rehabilitation team. Stroke team will follow Antony Contras, MD Medical Director [place] Pager: [phone] 10/03/2016 1:07 PM  Stroke Neurology 10/03/2016 1:02 PM    To contact Stroke Continuity provider, please refer to [email or web address] After hours, contact General Neurology

## 2016-10-03 NOTE — Progress Notes (Signed)
Orthopedic Tech Progress Note Patient Details:  Savannah Travis [birth year] [id number]  Patient ID: Savannah Travis, female   DOB: 02-20-1941, 76 y.o.   MRN: [id number]   [name] 10/03/2016, 11:37 AM Called in advanced brace order; spoke with Carolyne Fiscal

## 2016-10-03 NOTE — Progress Notes (Signed)
Speech Language Pathology Weekly Progress and Session Note  Patient Details  Name: Savannah Travis MRN: [id number] Date of Birth: 1940/11/02  Beginning of progress report period: September 26, 2016  End of progress report period: October 03, 2016   Today's Date: 10/03/2016 SLP Individual Time: 1435-1530 SLP Individual Time Calculation (min): 55 min   Short Term Goals: Week 1: SLP Short Term Goal 1 (Week 1): Pt will utilize overarticulation and increased vocal intensity to achieve intelligibility in conversations in a minimally distracting environment with supervision.  SLP Short Term Goal 1 - Progress (Week 1): Progressing toward goal SLP Short Term Goal 2 (Week 1): Pt will utilize compensatory word finding strategies in conversations with supervision verbal cues.  SLP Short Term Goal 2 - Progress (Week 1): Met    New Short Term Goals: Week 2: SLP Short Term Goal 1 (Week 2): Pt will utilize overarticulation and increased vocal intensity to achieve intelligibility in conversations in a minimally distracting environment with supervision.  SLP Short Term Goal 2 (Week 2): Pt will utilize compensatory word finding strategies in conversations with mod I.    Weekly Progress Updates: Pt has made slow functional gains this reporting period and has met 1 out of 2 short term goals.  Pt is currently min assist for use of intelligibility strategies in conversations and supervision for word finding.  Pt and family education is ongoing.  As a result, pt would continue to benefit from skilled ST while inpatient in order to maximize functional independence and reduce burden of care prior to discharge home.     Intensity: Minumum of 1-2 x/day, 30 to 90 minutes Frequency: 3 to 5 out of 7 days Duration/Length of Stay: 10-14 days  Treatment/Interventions: Cognitive remediation/compensation;Cueing hierarchy;Functional tasks;Patient/family education;Internal/external aids   Daily Session  Skilled Therapeutic  Interventions: Pt was seen for skilled ST targeting communication goals.  SLP facilitated the session with a novel card game targeting use of compensatory strategies for intelligibility and word finding.  Pt required min assist verbal cues during task for slow rate and increased vocal intensity to achieve intelligibility in conversations.  Pt reports feeling better today in comparison to yesterday but still appears fatigued which impacts her intelligibility functionally.  Pt was overall supervision level assist for word finding during the abovementioned task.  Husband was present during today's therapy session and was updated regarding current goals and progress in therapies.  Pt was left in bed with bed alarm set and call bell within reach.  Goals updated on this date to reflect current progress and plan of care.       Function:   Eating Eating                 Cognition Comprehension Comprehension assist level: Follows basic conversation/direction with extra time/assistive device  Expression   Expression assist level: Expresses basic 75 - 89% of the time/requires cueing 10 - 24% of the time. Needs helper to occlude trach/needs to repeat words.  Social Interaction Social Interaction assist level: Interacts appropriately 90% of the time - Needs monitoring or encouragement for participation or interaction.  Problem Solving Problem solving assist level: Solves basic 90% of the time/requires cueing < 10% of the time  Memory Memory assist level: Recognizes or recalls 75 - 89% of the time/requires cueing 10 - 24% of the time   General    Pain Pain Assessment Pain Assessment: No/denies pain  Therapy/Group: Individual Therapy  Francine Hannan, Selinda Orion 10/03/2016, 4:32 PM

## 2016-10-03 NOTE — Procedures (Signed)
ELECTROENCEPHALOGRAM REPORT  Date of Study: 10/03/2016  Patient's Name: Savannah Travis MRN: [id number] Date of Birth: 02-07-1941  Referring Provider: Dr. Alysia Penna  Clinical History: This is a 76 year old woman with recurrent episodes of transient headache, chest pain, with altered awareness.  Medications: valproate (DEPACON) 1,000 mg in dextrose 5 % 50 mL IVPB  Oxcarbazepine (TRILEPTAL) tablet 300 mg  clonazePAM (KLONOPIN) tablet 1 mg  acetaminophen (TYLENOL) tablet 650 mg  allopurinol (ZYLOPRIM) tablet 300 mg  amLODipine (NORVASC) tablet 5 mg  aspirin EC tablet 325 mg  atorvastatin (LIPITOR) tablet 40 mg  cefUROXime (CEFTIN) tablet 500 mg  clopidogrel (PLAVIX) tablet 75 mg  diclofenac sodium (VOLTAREN) 1 % transdermal gel 2 g  divalproex (DEPAKOTE ER) 24 hr tablet 500 mg  maprotiline (LUDIOMIL) tablet 50 mg  montelukast (SINGULAIR) tablet 10 mg  potassium chloride (K-DUR,KLOR-CON) CR tablet 10 mEq   Technical Summary: A multichannel digital EEG recording measured by the international 10-20 system with electrodes applied with paste and impedances below 5000 ohms performed as portable with EKG monitoring in a predominantly drowsy and asleep patient.  Photic stimulation was performed.  The digital EEG was referentially recorded, reformatted, and digitally filtered in a variety of bipolar and referential montages for optimal display.   Description: The patient is predominantly drowsy and asleep during the recording.  During brief period of wakefulness, there is a symmetric, medium voltage 9 Hz posterior dominant rhythm that attenuates with eye opening. The record is symmetric. During drowsiness and sleep, there is an increase in theta slowing of the background.  Vertex waves and symmetric sleep spindles were seen.  Photic stimulation did not elicit any abnormalities.  There were no epileptiform discharges or electrographic seizures seen.    EKG lead was  unremarkable.  Impression: This predominantly drowsy and asleep EEG is normal.  Clinical Correlation: A normal EEG does not exclude a clinical diagnosis of epilepsy. Clinical correlation is advised.    Ellouise Newer, M.D.

## 2016-10-04 ENCOUNTER — Inpatient Hospital Stay (HOSPITAL_COMMUNITY): Payer: Medicare Other | Admitting: Speech Pathology

## 2016-10-04 ENCOUNTER — Inpatient Hospital Stay (HOSPITAL_COMMUNITY): Payer: Medicare Other | Admitting: Physical Therapy

## 2016-10-04 ENCOUNTER — Inpatient Hospital Stay (HOSPITAL_COMMUNITY): Payer: Medicare Other | Admitting: Occupational Therapy

## 2016-10-04 NOTE — Progress Notes (Signed)
Occupational Therapy Session Note  Patient Details  Name: Savannah Travis MRN: [id number] Date of Birth: May 07, 1941  Today's Date: 10/04/2016 OT Individual Time: 0800-0901 OT Individual Time Calculation (min): 61 min     Short Term Goals: Week 2:  OT Short Term Goal 1 (Week 2): Continue working on supervision level goals for discharge.    Skilled Therapeutic Interventions/Progress Updates:    Pt completed self feeding to start session, using the RUE for 50% of the meal.  She was issued red foam grip for use over utensil when eating with the dominant right hand.  Pt occasionally needing min assist secondary to motor planning and not bringing her RUE into enough shoulder flexion with supination to bring the fork to her mouth correctly.  Once finished with eating pt completed dressing tasks from the bedside chair.  She was able to recall the need to thread the RLE first when donning pants this session.  Min assist for sit to stand from the bedside chair during LB dressing with mod assist needed to pull pants over hips.  She donned the left shoe but therapist provided mod assist on the right, including max assist for tying both.  Transfer to wheelchair required mod assist secondary to LOB posteriorly when attempting to back up to the chair.  Pt still exhibits decreased ability to take larger steps on the right side.  Pt left in wheelchair at end of session in preparation for next PT session.    Therapy Documentation Precautions:  Precautions Precautions: Fall Restrictions Weight Bearing Restrictions: No  Pain: Pain Assessment Pain Assessment: No/denies pain Pain Score: 0-No pain ADL: See Function Navigator for Current Functional Status.   Therapy/Group: Individual Therapy  Mackenzye Mackel OTR/L 10/04/2016, 12:22 PM

## 2016-10-04 NOTE — Progress Notes (Signed)
STROKE TEAM PROGRESS NOTE   SUBJECTIVE (INTERVAL HISTORY)  Patient states she is doing better. She has had no headaches or recurrent episodes. She is tolerating Depakote well without side effects. She had a repeat EEG done which showed no seizure activity. She has no new neurological complaints today.  OBJECTIVE Temp:  [97.4 F (36.3 C)-98 F (36.7 C)] 98 F (36.7 C) (01/11 1457) Pulse Rate:  [77-84] 84 (01/11 1457) Resp:  [17-18] 18 (01/11 1457) BP: (118-141)/(64-69) 141/64 (01/11 1457) SpO2:  [96 %-100 %] 100 % (01/11 1457)  CBC:  No results for input(s): WBC, NEUTROABS, HGB, HCT, MCV, PLT in the last 168 hours.  Basic Metabolic Panel:   Recent Labs Lab 09/29/16 0839 10/03/16 1149  NA 139 141  K 3.4* 4.0  CL 109 109  CO2 21* 22  GLUCOSE 128* 88  BUN 14 15  CREATININE 0.93 1.01*  CALCIUM 9.1 9.7    Lipid Panel:     Component Value Date/Time   CHOL 156 09/22/2016 0433   TRIG 111 09/22/2016 0433   HDL 47 09/22/2016 0433   CHOLHDL 3.3 09/22/2016 0433   VLDL 22 09/22/2016 0433   LDLCALC 87 09/22/2016 0433   HgbA1c:  Lab Results  Component Value Date   HGBA1C 5.6 09/22/2016   Urine Drug Screen: No results found for: LABOPIA, COCAINSCRNUR, LABBENZ, AMPHETMU, THCU, LABBARB    IMAGING I have personally reviewed the radiological images below and agree with the radiology interpretations.  Ct Head Wo Contrast 09/11/2016 IMPRESSION: No acute intracranial pathology.   Mr Jeri Cos Wo Contrast 09/21/2016 IMPRESSION: 1. Acute infarct in the central left corona radiata, nonhemorrhagic. 2. Focal high-grade left M1 segment stenosis. Elsewhere negative intracranial MRA. 3. 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   Mr Cervical Spine Wo Contrast 09/22/2016 IMPRESSION: No acute or likely significant finding in the cervical region. No change since the study of November. Degenerative facet arthropathy left worse than right. Mild disc  degeneration. No compressive stenosis of the canal or foramina. No primary cord lesion.   Mr Thoracic Spine Wo Contrast 09/23/2016 IMPRESSION: Thoracic scoliosis. Shallow disc protrusion at T2-3 without significant neural compression. No other disc protrusions, spinal or foraminal stenosis in the thoracic spine. Normal MR appearance of the thoracic spinal cord.   Mr Lumbar Spine Wo Contrast 09/22/2016 IMPRESSION: No likely significant finding at L3-4 or above. Old compression fracture at L5 which is healed. Posterior bowing of the posterosuperior margin of the vertebral body. Mild bulging of the L4-5 disc. In combination with the L5 bowing hand with bilateral facet and ligamentous hypertrophy, there is moderate stenosis at this level that could possibly cause neural compression. L5-S1 facet arthropathy with gaping, fluid-filled facet joints. Mild bulging of the disc. Mild narrowing of the subarticular lateral recesses and neural foramina. This appearance would likely worsen with standing or flexion based on the appearance of the facet arthropathy. 5   TTE - Left ventricle: The cavity size was normal. Systolic function was   normal. The estimated ejection fraction was in the range of 60%   to 65%. Wall motion was normal; there were no regional wall   motion abnormalities. Doppler parameters are consistent with   abnormal left ventricular relaxation (grade 1 diastolic   dysfunction) Impressions: - Compared to the prior study, there has been no significant   interval change.  Carotid Doppler - 08/08/2016 - normal carotid and vertebral arteries.  EEG - The EEG is normal in awake and  drowsy state.  30 day cardia event monitoring - summer 2017 in [place] - record on request   PHYSICAL EXAM  Temp:  [97.4 F (36.3 C)-98 F (36.7 C)] 98 F (36.7 C) (01/11 1457) Pulse Rate:  [77-84] 84 (01/11 1457) Resp:  [17-18] 18 (01/11 1457) BP: (118-141)/(64-69) 141/64 (01/11 1457) SpO2:  [96 %-100 %]  100 % (01/11 1457)  General - Well nourished, well developed, in no apparent distress.  Ophthalmologic - Fundi not visualized due to noncooperation.  Cardiovascular - Regular rate and rhythm.  Mental Status -  Level of arousal and orientation to time, place, and person were intact. Language including expression, naming, repetition, comprehension was assessed and found intact.  .  Cranial Nerves II - XII - II - Visual field intact OU. III, IV, VI - Extraocular movements intact. V - Facial sensation intact bilaterally. VII - mild right facial droop. VIII - Hearing & vestibular intact bilaterally. X - Palate elevates symmetrically. XI - Chin turning & shoulder shrug intact bilaterally. XII - Tongue protrusion to the right  Motor Strength - The patient's strength was 4/5 LUE and LLE, but 4/5 RUE and 4-/5 RLE.  Bulk was normal and fasciculations were absent.   Motor Tone - Muscle tone was assessed at the neck and appendages and was normal.  Reflexes - The patient's reflexes were 1+ in all extremities and she had no pathological reflexes.  Sensory - Light touch, temperature/pinprick were assessed and were symmetrical.    Coordination - The patient had normal movements in the hands with no ataxia or dysmetria.  Tremor was absent.  Gait and Station - deferred.   ASSESSMENT/PLAN Ms. Savannah Travis is a 76 y.o. female with history of hypertension, breast cancer, hyperlipidemia and depression  presenting with unresponsive episodes, dysphagia, confusion, and progressive weakness. She did not receive IV t-PA due to unknown time of onset.  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 Possibly computed migraine versus complex partial seizures.  Resultant right sided weakness and mild dysarthria now improving  MRI - Acute infarct in the central  left corona radiata,  MRA - Focal high-grade left M1 segment stenosis.  Carotid Doppler - 08/08/2016 - normal carotid and vertebral arteries.  EEG normal  2D Echo - EF 60%  to 65%  30 day cardiac event monitoring done in 02/2016 in [place], [place]. Record on request  LDL - 87  HgbA1c pending  VTE prophylaxis - SCDs Diet heart healthy/carb modified Room service appropriate? Yes; Fluid consistency: Thin  aspirin 81 mg daily prior to admission, now on clopidogrel 75 mg daily and ASA 325mg  daily. Continue DAPT for 3 months and then plavix alone due to intracranial stenosis.   Patient counseled to be compliant with her antithrombotic medications  Ongoing aggressive stroke risk factor management  Therapy recommendations:CIR  Disposition: Pending  Episodes of passing out  Continue to occur after stopping beta blocker  Concerning for complex partial seizure  Follow up with Dr. Leta Baptist at [facility]  EEG negative  On trileptal   300mg  bid   30 day cardiac event monitoring done last summer in [place] - record on request  Walking difficulty  Progressive walking difficulty  MRI C/T/L spine unrevealing  Denies significant back pain  Complicated by left CR infarct with right sided hemiparesis  CIR recommended  Follow up with Dr. Leta Baptist as outpt - may  consider EMG/NCS  Migraine   On topamax 25mg  twice daily Plan to DC  Pt HA did not improve  Plan add depacon 1 gm x1 now and depakote ER 500 mg daily  Follow up with Dr. Leta Baptist as outpt  Hypertension  Stable  Permissive hypertension (OK if < 220/120) but gradually normalize in 5-7 days  Long-term BP goal normotensive  Hyperlipidemia  Home meds: Lipitor 40 mg daily resumed in hospital  LDL 87, goal < 70  Continue statin at discharge  Other Stroke Risk Factors  Advanced age  Obesity, Body mass index is 45.4 kg/m., recommend weight loss, diet and exercise as appropriate    Other Active Problems  UTI  - UA WBC 6-30 - on ceftin  Hospital day # 9  I have personally examined this patient, reviewed notes, independently viewed imaging studies, participated in medical decision making and plan of care.ROS completed by me personally and pertinent positives fully documented  I have made any additions or clarifications directly to the above note. Recurrent episodes are of unclear etiology possibly complex partial seizures versus complicated migraine. Recommend continue Depakote ER 500 mg daily.  Topamax. Continue Trileptal.  Long discussion with the patient at the bedside and answered questions. Greater than 50% time during this 25 minute visit was spent in counseling and coordination of care about her recent stroke, recurrent episodes and answering questions . Discussed with rehabilitation team. Stroke team will sign off. F/u with dr Leta Baptist in [facility] office. Antony Contras, MD Medical Director [facility] Pager: [phone] 10/04/2016 3:06 PM  Stroke Neurology 10/04/2016 3:06 PM    To contact Stroke Continuity provider, please refer to [email or web address] After hours, contact General Neurology

## 2016-10-04 NOTE — Progress Notes (Signed)
Subjective/Complaints:  no further "spells", appreciate neurology note, appreciate EEG, discussed with Dr. Leonie Man Per OT, pt also exhibiting apraxia LUE   ROS  Denies N/V/D, no CP or SOB Objective: Vital Signs: Blood pressure 118/69, pulse 77, temperature 97.4 F (36.3 C), temperature source Oral, resp. rate 17, height 4' 8"  (1.422 m), weight 91.9 kg (202 lb 8 oz), SpO2 96 %. No results found. Results for orders placed or performed during the hospital encounter of 09/25/16 (from the past 72 hour(s))  Glucose, capillary     Status: Abnormal   Collection Time: 10/01/16  7:40 PM  Result Value Ref Range   Glucose-Capillary 142 (H) 65 - 99 mg/dL  Basic metabolic panel     Status: Abnormal   Collection Time: 10/03/16 11:49 AM  Result Value Ref Range   Sodium 141 135 - 145 mmol/L   Potassium 4.0 3.5 - 5.1 mmol/L   Chloride 109 101 - 111 mmol/L   CO2 22 22 - 32 mmol/L   Glucose, Bld 88 65 - 99 mg/dL   BUN 15 6 - 20 mg/dL   Creatinine, Ser 1.01 (H) 0.44 - 1.00 mg/dL   Calcium 9.7 8.9 - 10.3 mg/dL   GFR calc non Af Amer 53 (L) >60 mL/min   GFR calc Af Amer >60 >60 mL/min    Comment: (NOTE) The eGFR has been calculated using the CKD EPI equation. This calculation has not been validated in all clinical situations. eGFR's persistently <60 mL/min signify possible Chronic Kidney Disease.    Anion gap 10 5 - 15     HEENT: normal and +dysarthria Cardio: RRR and No murmurs or extra sounds, Tenderness to palpation along the costochondral  junction on the left side Resp: CTA B/L and Unlabored GI: BS positive and Nontender, nondistended Extremity:  Pulses positive and No Edema Skin:   Intact Neuro: Alert/Oriented, Cranial Nerve II-XII normal, Normal Sensory, Abnormal Motor Motor strength is 3 minus in the right deltoid, bicep, triceps, grip,4 minus in the right hip flexor, knee extensor, ankle dorsiflexor.4/5 in left deltoid, biceps, triceps, grip, hip flexor, knee extensor, ankle dorsi  flexor, Abnormal FMC Ataxic/ dec FMC, Tone:  Within Normal Limits, Dysarthric and Aphasic Musc/Skel:  Other Limited right shoulder range of motion without pain. Gen. no acute distress   Assessment/Plan: 1. Functional deficits secondary to left corona radiata infarct, right hemiparesis, aphasia, dysarthria which require 3+ hours per day of interdisciplinary therapy in a comprehensive inpatient rehab setting. Physiatrist is providing close team supervision and 24 hour management of active medical problems listed below. Physiatrist and rehab team continue to assess barriers to discharge/monitor patient progress toward functional and medical goals. FIM: Function - Bathing Position: Shower Body parts bathed by patient: Right arm, Left arm, Chest, Abdomen, Front perineal area, Right upper leg, Left upper leg, Buttocks, Right lower leg, Left lower leg, Back Body parts bathed by helper: Right lower leg, Left lower leg, Back Bathing not applicable: Right lower leg, Left lower leg Assist Level: Touching or steadying assistance(Pt > 75%)  Function- Upper Body Dressing/Undressing What is the patient wearing?: Bra, Pull over shirt/dress Bra - Perfomed by patient: Thread/unthread right bra strap, Thread/unthread left bra strap Bra - Perfomed by helper: Hook/unhook bra (pull down sports bra) Pull over shirt/dress - Perfomed by patient: Thread/unthread right sleeve, Thread/unthread left sleeve, Put head through opening, Pull shirt over trunk Assist Level: Supervision or verbal cues Set up : To obtain clothing/put away Function - Lower Body Dressing/Undressing What is the  patient wearing?: Pants, Non-skid slipper socks Position: Other (comment) (toilet) Underwear - Performed by patient: Thread/unthread right underwear leg, Thread/unthread left underwear leg Underwear - Performed by helper: Pull underwear up/down Pants- Performed by patient: Thread/unthread right pants leg, Thread/unthread left pants leg,  Pull pants up/down Pants- Performed by helper: Pull pants up/down Non-skid slipper socks- Performed by helper: Don/doff right sock, Don/doff left sock Shoes - Performed by patient: Don/doff right shoe, Don/doff left shoe Shoes - Performed by helper: Fasten right, Fasten left TED Hose - Performed by helper: Don/doff right TED hose, Don/doff left TED hose Assist for footwear: Dependant Assist for lower body dressing:  (mod A)  Function - Toileting Toileting steps completed by patient: Adjust clothing prior to toileting, Performs perineal hygiene, Adjust clothing after toileting Toileting steps completed by helper: Adjust clothing after toileting Toileting Assistive Devices: Grab bar or rail Assist level: Touching or steadying assistance (Pt.75%)  Function - Air cabin crew transfer assistive device: Elevated toilet seat/BSC over toilet, Grab bar Assist level to toilet: Maximal assist (Pt 25 - 49%/lift and lower) Assist level from toilet: Maximal assist (Pt 25 - 49%/lift and lower) Assist level to bedside commode (at bedside): Touching or steadying assistance (Pt > 75%) Assist level from bedside commode (at bedside): Touching or steadying assistance (Pt > 75%)  Function - Chair/bed transfer Chair/bed transfer activity did not occur: N/A Chair/bed transfer method: Ambulatory Chair/bed transfer assist level: Touching or steadying assistance (Pt > 75%) Chair/bed transfer assistive device: Armrests, Walker Chair/bed transfer details: Verbal cues for safe use of DME/AE, Verbal cues for technique  Function - Locomotion: Wheelchair Will patient use wheelchair at discharge?: Yes Type: Manual Max wheelchair distance: 45f  Assist Level: Moderate assistance (Pt 50 - 74%) Assist Level: Moderate assistance (Pt 50 - 74%) Function - Locomotion: Ambulation Assistive device: Walker-rolling Max distance: 15 Assist level: Touching or steadying assistance (Pt > 75%) Assist level: Touching or  steadying assistance (Pt > 75%) Walk 50 feet with 2 turns activity did not occur: Safety/medical concerns (L knee painful, R knee weak) Assist level: Touching or steadying assistance (Pt > 75%) Walk 10 feet on uneven surfaces activity did not occur: Safety/medical concerns (L knee painful, R knee weak) Assist level: Moderate assist (Pt 50 - 74%)  Function - Comprehension Comprehension: Auditory Comprehension assist level: Follows basic conversation/direction with extra time/assistive device  Function - Expression Expression: Verbal Expression assist level: Expresses basic 75 - 89% of the time/requires cueing 10 - 24% of the time. Needs helper to occlude trach/needs to repeat words.  Function - Social Interaction Social Interaction assist level: Interacts appropriately 90% of the time - Needs monitoring or encouragement for participation or interaction.  Function - Problem Solving Problem solving assist level: Solves basic 90% of the time/requires cueing < 10% of the time  Function - Memory Memory assist level: Recognizes or recalls 75 - 89% of the time/requires cueing 10 - 24% of the time Patient normally able to recall (first 3 days only): Current season, Location of own room, Staff names and faces, That he or she is in a hospital   Medical Problem List and Plan: 1.Decreased functional mobility with lower extremity weakness/altered mental status/question syncopesecondary to left corona radiata infarct -CIR PT OT speech , dissipating with therapy, 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 2. DVT Prophylaxis/Anticoagulation: SCDs. Monitor for any signs of DVT 3. Pain Management/headaches: Topamax 25 mg twice a day, chest pain, musculoskeletal chest wall, sports cream, has had cardiac workup for chest pain  which was negative. Repeat EKG times 2 negative. No pain during physical therapy. Left knee pain, advanced osteoarthritis, medial compartment, as well as patellofemoral,  moderate at the lateral compartment. Cont Voltaren gel not helpful . We will order a knee orthosis 4. Mood: Ludiomil 50 mg daily at bedtime.Klonopin 1 mg daily at bedtime---pt would like this scheduled 5. Neuropsych: This patient iscapable of making decisions on herown behalf. 6. Skin/Wound Care: Routine skin checks 7. Fluids/Electrolytes/Nutrition: Routine I&O with follow-up chemistries upon admit. Encourage PO, 1000 mL fluid intake, 85-100% meal intake. 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 Continue Trileptal 300 mg twice a day           -per neuro no clear-cut seizure activity, possible complicated migraine, Topamax discontinued, Depakote added 9.Hypertension. Norvasc 5 mg daily. Monitor with increased mobility 10.Klebsiella UTI. Ceftin initiated 09/24/2016 transitioning from Rocephin. May have spastic neurogenic bladder in addition to UTI. Hold off on anticholinergic agents until UTI treated, PVR 0-30 cc 11.Hyperlipidemia. Lipitor 12.History of gout. Allopurinol 300 mg daily. Monitor for any gout flareups. No pain at present    13.  Pre renal azotemia, oral intake, improving   BMP Latest Ref Rng & Units 10/03/2016 09/29/2016 09/26/2016  Glucose 65 - 99 mg/dL 88 128(H) 117(H)  BUN 6 - 20 mg/dL 15 14 26(H)  Creatinine 0.44 - 1.00 mg/dL 1.01(H) 0.93 1.22(H)  Sodium 135 - 145 mmol/L 141 139 140  Potassium 3.5 - 5.1 mmol/L 4.0 3.4(L) 4.0  Chloride 101 - 111 mmol/L 109 109 108  CO2 22 - 32 mmol/L 22 21(L) 23  Calcium 8.9 - 10.3 mg/dL 9.7 9.1 9.0   14.  HypoK on potassium, Normal potassium level on 10/03/2016 LOS (Days) 9 A FACE TO FACE EVALUATION WAS PERFORMED  Katira Dumais E 10/04/2016, 9:11 AM

## 2016-10-04 NOTE — Progress Notes (Signed)
Occupational Therapy Session Note  Patient Details  Name: Savannah Travis MRN: [id number] Date of Birth: [birth year]  Today's Date: 10/04/2016 OT Individual Time: 1130-1200 OT Individual Time Calculation (min): 30 min     Short Term Goals: Week 2:  OT Short Term Goal 1 (Week 2): Continue working on supervision level goals for discharge.    Skilled Therapeutic Interventions/Progress Updates:   1:1 Performed short distant ambulation with RW with close supervision with mod cuing for increasing step length on the right to decr fall risk- 2x . Perform nine hole peg test in a non- standardized way: 76min and 41 seconds putting pegs into the holes and 42 seconds on the left in standing.    Therapy Documentation Precautions:  Precautions Precautions: Fall Restrictions Weight Bearing Restrictions: No Pain: Pain Assessment Pain Assessment: No/denies pain  See Function Navigator for Current Functional Status.   Therapy/Group: Individual Therapy  Willeen Cass [facility] 10/04/2016, 3:53 PM

## 2016-10-04 NOTE — Progress Notes (Signed)
Physical Therapy Session Note  Patient Details  Name: Savannah Travis MRN: [id number] Date of Birth: 1941/09/09  Today's Date: 10/04/2016 PT Individual Time: 0905-1000 PT Individual Time Calculation (min): 55 min    Short Term Goals: Week 1:  PT Short Term Goal 1 (Week 1): pt will move supine> sitting with min assist PT Short Term Goal 1 - Progress (Week 1): Not met PT Short Term Goal 2 (Week 1): pt will transfer bed>< w/c with mod assist PT Short Term Goal 2 - Progress (Week 1): Met PT Short Term Goal 3 (Week 1): pt will propel w/c x 100' with min assist for strengthening PT Short Term Goal 3 - Progress (Week 1): Not met PT Short Term Goal 4 (Week 1): pt will perform gait x 50' with mod assist PT Short Term Goal 4 - Progress (Week 1): Met Week 2:    STG = LTG due to ELOS  Skilled Therapeutic Interventions/Progress Updates:     Pt received sitting in WC and agreeable to PT  WC mobility with BUE and BUE propulsion x 74f with supervision assist from PT. Min cues for improved use of BLE to aide with steering to avoid obstacles in hall.   PT instructed [place] in [place] dynamic balance training in parallel bars including. Forward/backward gait 2 x 10 Side stepping x 13feach direction Sit<>stand x 5 min assist progressing to close supervision assist Tandem stance with 1 progressing to  No UE support.  SLS with BUE support x 5 sec each   Pt limited by L knee OA knee pain. Patient required mod cues for proper technique including improved weight shift L and R and improved understanding and balance tasks and proper UE support. Prolonged rest breaks required between each exercise due to knee pain   [facility] too [facility] and all needs met.          Therapy Documentation Precautions:  Precautions Precautions: Fall Restrictions Weight Bearing Restrictions: No General:   Vital Signs: Therapy Vitals Temp: 97.4 F (36.3  C) Temp Source: Oral Pulse Rate: 77 Resp: 17 BP: 118/69 Patient Position (if appropriate): Lying Oxygen Therapy SpO2: 96 % O2 Device: Not Delivered Pain:   5/10 with activity. 0/10 at rest  See Function Navigator for Current Functional Status.   Therapy/Group: Individual   AuLorie Phenix/07/2017, 10:02 AM

## 2016-10-04 NOTE — Progress Notes (Signed)
Orthopedic Tech Progress Note Patient Details:  Savannah Travis [birth year] [id number] Brace order completed by Cala Bradford vendor. Patient ID: Savannah Travis, female   DOB: May 13, 1941, 76 y.o.   MRN: [id number]   Braulio Bosch 10/04/2016, 4:21 PM

## 2016-10-04 NOTE — Progress Notes (Signed)
Speech Language Pathology Daily Session Note  Patient Details  Name: Savannah Travis MRN: [id number] Date of Birth: 02/28/1941  Today's Date: 10/04/2016 SLP Individual Time: 1400-1445 SLP Individual Time Calculation (min): 45 min   Short Term Goals: Week 2: SLP Short Term Goal 1 (Week 2): Pt will utilize overarticulation and increased vocal intensity to achieve intelligibility in conversations in a minimally distracting environment with supervision.  SLP Short Term Goal 2 (Week 2): Pt will utilize compensatory word finding strategies in conversations with mod I.    Skilled Therapeutic Interventions:  Pt was seen for skilled ST targeting communication goals.  SLP facilitated the session with a novel card game to address use of word finding and intelligibility strategies while multitasking.  Pt was overall mod I for use of overarticulation and increased vocal intensity to achieve intelligibility in conversations but needed x1 supervision verbal cue for complex, abstract word finding.  Pt was returned to room and left in wheelchair with call bell within reach and daughter at bedside.  Continue per current plan of care.     Function:  Eating Eating   Modified Consistency Diet: No Eating Assist Level: Set up assist for;More than reasonable amount of time   Eating Set Up Assist For: Opening containers;Cutting food   Helper Brings Food to Mouth: Occasionally   Cognition Comprehension Comprehension assist level: Follows basic conversation/direction with extra time/assistive device  Expression   Expression assist level: Expresses basic 90% of the time/requires cueing < 10% of the time.  Social Interaction Social Interaction assist level: Interacts appropriately 90% of the time - Needs monitoring or encouragement for participation or interaction.  Problem Solving Problem solving assist level: Solves basic 90% of the time/requires cueing < 10% of the time  Memory Memory assist level: Recognizes or  recalls 75 - 89% of the time/requires cueing 10 - 24% of the time    Pain Pain Assessment Pain Assessment: No/denies pain  Therapy/Group: Individual Therapy  Anitra Doxtater, Selinda Orion 10/04/2016, 3:45 PM

## 2016-10-04 NOTE — Progress Notes (Signed)
Social Work Patient ID: Savannah Travis, female   DOB: July 28, 1941, 76 y.o.   MRN: [id number]  Met with pt who reports he daughter is off on Monday and could do family education with she and husband then. Have contacted daughter To set up education Monday @ 10:00 aware will be until 1:45 pm. Daughter and husband to be here on Monday.

## 2016-10-05 ENCOUNTER — Inpatient Hospital Stay (HOSPITAL_COMMUNITY): Payer: Medicare Other | Admitting: Physical Therapy

## 2016-10-05 ENCOUNTER — Inpatient Hospital Stay (HOSPITAL_COMMUNITY): Payer: Medicare Other | Admitting: Occupational Therapy

## 2016-10-05 ENCOUNTER — Inpatient Hospital Stay (HOSPITAL_COMMUNITY): Payer: Medicare Other | Admitting: Speech Pathology

## 2016-10-05 MED ORDER — BUTALBITAL-APAP-CAFFEINE 50-325-40 MG PO TABS
1.0000 | ORAL_TABLET | Freq: Four times a day (QID) | ORAL | Status: DC | PRN
  Administered 2016-10-05 – 2016-10-09 (×3): 1 via ORAL
  Filled 2016-10-05 (×3): qty 1

## 2016-10-05 MED ORDER — FLAVOXATE HCL 100 MG PO TABS
100.0000 mg | ORAL_TABLET | Freq: Three times a day (TID) | ORAL | Status: AC
  Administered 2016-10-05 – 2016-10-06 (×5): 100 mg via ORAL
  Filled 2016-10-05 (×6): qty 1

## 2016-10-05 NOTE — Progress Notes (Signed)
Occupational Therapy Session Note  Patient Details  Name: Savannah Travis MRN: [id number] Date of Birth: 1941/06/21  Today's Date: 10/05/2016 OT Individual Time: [id number] OT Individual Time Calculation (min): 48 min     Short Term Goals: Week 2:  OT Short Term Goal 1 (Week 2): Continue working on supervision level goals for discharge.    Skilled Therapeutic Interventions/Progress Updates:    Pt seen for OT treatment.  Rolled her down to the therapy gym via wheelchair with transfer to the therapy mat with min assist using the RW.  Had pt work on donning pillowcases on pillows in sitting with supervision and increased time to increase functional use of the RUE.  Next had pt work in standing on reaching and pulling off matching cards from the back of the mirror with the RUE and placing them on target, encouraging shoulder flexion and slight abduction.  Pt able to tolerate 2-3 mins of standing before needing a rest break secondary to dyspnea 2/4.  Pt reported feeling very fatigued after standing.  Oxygen sats still at 93-94% with HR in the low 80s.  After sitting a couple of mins pt would continually close her eyes and her speech became much softer.  She reported having some chest pain on the left side.  Positioned pt in supine with BP taken at 145/64.  After a couple mins she stated she felt a little better and transitioned back to sitting.  She stated the chest pain had improved but was still there.  Transferred pt back to the wheelchair and then back to bed.  Nursing and PA both made aware as well.  Vitals taken again in supine to 139/51 with HR at 85 and O2 at 94.  Call button in reach and nursing present at end of session.  Pt missed 10 mins secondary to fatigue and pain. Therapy Documentation Precautions:  Precautions Precautions: Fall Restrictions Weight Bearing Restrictions: No General: General OT Amount of Missed Time: 10 Minutes Vital Signs: Therapy Vitals Temp: 97.5 F (36.4 C) Temp  Source: Oral Pulse Rate: 86 Resp: 18 BP: (!) 139/51 Patient Position (if appropriate): Sitting Oxygen Therapy SpO2: 96 % O2 Device: Not Delivered Pain: Pain Assessment Pain Assessment: Faces Faces Pain Scale: Hurts little more Pain Type: Acute pain Pain Location: Chest Pain Descriptors / Indicators: Discomfort Pain Onset: With Activity Pain Intervention(s): MD notified (Comment);RN made aware ADL: See Function Navigator for Current Functional Status.   Therapy/Group: Individual Therapy  Wilfred Siverson OTR/L 10/05/2016, 3:54 PM

## 2016-10-05 NOTE — Progress Notes (Signed)
Occupational Therapy Session Note  Patient Details  Name: Savannah Travis MRN: [id number] Date of Birth: [birth year]  Today's Date: 10/05/2016 OT Individual Time: 0800-0902 OT Individual Time Calculation (min): 62 min     Short Term Goals: Week 2:  OT Short Term Goal 1 (Week 2): Continue working on supervision level goals for discharge.    Skilled Therapeutic Interventions/Progress Updates:    Pt completed bathing and dressing during session.  Min assist for ambulation to the bathroom for transfer to the toilet and for showering.  Occasional LOB posteriorly when standing to complete bathing.  She was able to use the LH sponge for washing her feet but she could not use it to wash her back.  Increased time needed secondary to motor planning deficits in the LUE with functional use.  Dressing sit to stand at the sink with mod assist overall.  She was able to stand with mod instructional cueing to push up from the chair and to reach back for pulling pants over hips with min assist.  She needed assist for fastening bra as well as for donning TEDs and shoes.  Provided LH shoe horn and elastic laces but feel pt will do better with shoe funnel and reacher.  Finished session with pt in wheelchair and call button in reach.    Therapy Documentation Precautions:  Precautions Precautions: Fall Restrictions Weight Bearing Restrictions: No  Pain: Pain Assessment Pain Assessment: No/denies pain Pain Score: 0-No pain ADL:     See Function Navigator for Current Functional Status.   Therapy/Group: Individual Therapy  Lizbett Garciagarcia OTR/L 10/05/2016, 12:07 PM

## 2016-10-05 NOTE — Progress Notes (Signed)
Physical Therapy Session Note  Patient Details  Name: Savannah Travis MRN: [id number] Date of Birth: 19-Jul-1941  Today's Date: 10/05/2016 PT Individual Time: 1100-1200 PT Individual Time Calculation (min): 60 min    Short Term Goals: Week 2:  PT Short Term Goal 1 (Week 2): STG =LTG due to ELOS  Skilled Therapeutic Interventions/Progress Updates:     Pt received sitting in WC and agreeable to PT  Pt transported to rehab gym in [facility]. PTinstructed patient in  Gait training for 73f and 517fsupervision progressing to min assist for last 10 ft of both walks due to poor foot clearance on the right and to allow increased WB over the LLE. Pain in LLE continues to limit proper weight shifting to allow full foot clearance. Additional gait training with Supportive knee brace on the L x 2048fPatient noted very slight decrease in pain with brace donned.    Nustep endurance training x 7 minutes. Patient unable to complet >7 minutes due to knee pain. Min cues from PT to increase speed at possible to >30 steps per minute, resistance level 2>4 throughout endurance training.   transfer training including sit<>stand and stand pivot with supervision at start of session progressing to min assist due to fatigue. Min cues for proper anterior weight shift and UE placement.   At end of session patient became very tearful due to perceived lack of progress and need for addition help at home. PT provided Emotional support to help patient see overall improvements in therapy and to edcuate patient on healing progress following stroke.   [facility] and all needs met.     Therapy Documentation Precautions:  Precautions Precautions: Fall Restrictions Weight Bearing Restrictions: No   Pain:   5/10 with movement, 0/10 at rest.   See Function Navigator for Current Functional Status.   Therapy/Group: Individual Therapy  AusLorie Phenix12/2018, 12:54 PM

## 2016-10-05 NOTE — Progress Notes (Signed)
Subjective/Complaints:  no new complaints. Slept well. No pain.   ROS: pt denies nausea, vomiting, diarrhea, cough, shortness of breath or chest pain  Objective: Vital Signs: Blood pressure (!) 127/58, pulse 73, temperature 97.7 F (36.5 C), temperature source Oral, resp. rate 18, height 4' 8"  (1.422 m), weight 91.9 kg (202 lb 8 oz), SpO2 98 %. No results found. Results for orders placed or performed during the hospital encounter of 09/25/16 (from the past 72 hour(s))  Basic metabolic panel     Status: Abnormal   Collection Time: 10/03/16 11:49 AM  Result Value Ref Range   Sodium 141 135 - 145 mmol/L   Potassium 4.0 3.5 - 5.1 mmol/L   Chloride 109 101 - 111 mmol/L   CO2 22 22 - 32 mmol/L   Glucose, Bld 88 65 - 99 mg/dL   BUN 15 6 - 20 mg/dL   Creatinine, Ser 1.01 (H) 0.44 - 1.00 mg/dL   Calcium 9.7 8.9 - 10.3 mg/dL   GFR calc non Af Amer 53 (L) >60 mL/min   GFR calc Af Amer >60 >60 mL/min    Comment: (NOTE) The eGFR has been calculated using the CKD EPI equation. This calculation has not been validated in all clinical situations. eGFR's persistently <60 mL/min signify possible Chronic Kidney Disease.    Anion gap 10 5 - 15     HEENT: normal and +dysarthria Cardio: RRR Resp: CTA B/L and Unlabored. Mild chest tend to palp GI: BS positive and Nontender, nondistended Extremity:  Pulses positive and No Edema Skin:   Intact Neuro: Alert/Oriented, Cranial Nerve II-XII normal, Normal Sensory, Abnormal Motor Motor strength is 3 minus in the right deltoid, bicep, triceps, grip,4 minus in the right hip flexor, knee extensor, ankle dorsiflexor.4/5 in left deltoid, biceps, triceps, grip, hip flexor, knee extensor, ankle dorsi flexor, Abnormal FMC Ataxic/ dec FMC, Tone:  Within Normal Limits, Dysarthric and Aphasic Musc/Skel:  Other Limited right shoulder range of motion without pain. Gen. no acute distress   Assessment/Plan: 1. Functional deficits secondary to left corona radiata  infarct, right hemiparesis, aphasia, dysarthria which require 3+ hours per day of interdisciplinary therapy in a comprehensive inpatient rehab setting. Physiatrist is providing close team supervision and 24 hour management of active medical problems listed below. Physiatrist and rehab team continue to assess barriers to discharge/monitor patient progress toward functional and medical goals. FIM: Function - Bathing Position: Shower Body parts bathed by patient: Right arm, Left arm, Chest, Abdomen, Front perineal area, Right upper leg, Left upper leg, Buttocks, Right lower leg, Left lower leg Body parts bathed by helper: Back Bathing not applicable: Right lower leg, Left lower leg Assist Level: Touching or steadying assistance(Pt > 75%)  Function- Upper Body Dressing/Undressing What is the patient wearing?: Bra, Pull over shirt/dress Bra - Perfomed by patient: Thread/unthread right bra strap, Thread/unthread left bra strap Bra - Perfomed by helper: Hook/unhook bra (pull down sports bra) Pull over shirt/dress - Perfomed by patient: Thread/unthread right sleeve, Thread/unthread left sleeve, Put head through opening, Pull shirt over trunk Assist Level: Supervision or verbal cues Set up : To obtain clothing/put away Function - Lower Body Dressing/Undressing What is the patient wearing?: Pants, Shoes, Ted Hose Position: Other (comment) (toilet) Underwear - Performed by patient: Thread/unthread right underwear leg, Thread/unthread left underwear leg Underwear - Performed by helper: Pull underwear up/down Pants- Performed by patient: Thread/unthread right pants leg, Thread/unthread left pants leg, Pull pants up/down Pants- Performed by helper: Pull pants up/down Non-skid slipper socks-  Performed by helper: Don/doff right sock, Don/doff left sock Shoes - Performed by patient: Don/doff left shoe Shoes - Performed by helper: Don/doff right shoe, Don/doff left shoe, Fasten right, Fasten left TED Hose  - Performed by helper: Don/doff right TED hose, Don/doff left TED hose Assist for footwear: Dependant Assist for lower body dressing:  (mod A)  Function - Toileting Toileting steps completed by patient: Adjust clothing prior to toileting, Performs perineal hygiene, Adjust clothing after toileting Toileting steps completed by helper: Adjust clothing after toileting Toileting Assistive Devices: Grab bar or rail Assist level: Touching or steadying assistance (Pt.75%)  Function - Air cabin crew transfer assistive device: Elevated toilet seat/BSC over toilet, Grab bar Assist level to toilet: Touching or steadying assistance (Pt > 75%) Assist level from toilet: Maximal assist (Pt 25 - 49%/lift and lower) Assist level to bedside commode (at bedside): Touching or steadying assistance (Pt > 75%) Assist level from bedside commode (at bedside): Touching or steadying assistance (Pt > 75%)  Function - Chair/bed transfer Chair/bed transfer activity did not occur: N/A Chair/bed transfer method: Stand pivot Chair/bed transfer assist level: Touching or steadying assistance (Pt > 75%) Chair/bed transfer assistive device: Armrests, Walker Chair/bed transfer details: Verbal cues for safe use of DME/AE, Verbal cues for technique  Function - Locomotion: Wheelchair Will patient use wheelchair at discharge?: Yes Type: Manual Max wheelchair distance: 28f Assist Level: Supervision or verbal cues Assist Level: Supervision or verbal cues Function - Locomotion: Ambulation Assistive device: Walker-rolling Max distance: 15 Assist level: Touching or steadying assistance (Pt > 75%) Assist level: Touching or steadying assistance (Pt > 75%) Walk 50 feet with 2 turns activity did not occur: Safety/medical concerns (L knee painful, R knee weak) Assist level: Touching or steadying assistance (Pt > 75%) Walk 10 feet on uneven surfaces activity did not occur: Safety/medical concerns (L knee painful, R knee  weak) Assist level: Moderate assist (Pt 50 - 74%)  Function - Comprehension Comprehension: Auditory Comprehension assist level: Follows basic conversation/direction with extra time/assistive device  Function - Expression Expression: Verbal Expression assist level: Expresses basic 90% of the time/requires cueing < 10% of the time.  Function - Social Interaction Social Interaction assist level: Interacts appropriately 90% of the time - Needs monitoring or encouragement for participation or interaction.  Function - Problem Solving Problem solving assist level: Solves basic 90% of the time/requires cueing < 10% of the time  Function - Memory Memory assist level: Recognizes or recalls 75 - 89% of the time/requires cueing 10 - 24% of the time Patient normally able to recall (first 3 days only): Current season, Location of own room, Staff names and faces, That he or she is in a hospital   Medical Problem List and Plan: 1.Decreased functional mobility with lower extremity weakness/altered mental status/question syncopesecondary to left corona radiata infarct -CIR PT OT speech , dissipating with therapy, 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  2. DVT Prophylaxis/Anticoagulation: SCDs. Monitor for any signs of DVT 3. Pain Management/headaches: Topamax 25 mg twice a day, chest pain, musculoskeletal chest wall, sports cream, has had cardiac workup for chest pain which was negative. Repeat EKG times 2 negative. No pain during physical therapy. Left knee pain, advanced osteoarthritis, medial compartment, as well as patellofemoral, moderate at the lateral compartment.  -Cont Voltaren gel not helpful .   - knee orthosis 4. Mood: Ludiomil 50 mg daily at bedtime.Klonopin 1 mg daily at bedtime---pt would like this scheduled 5. Neuropsych: This patient iscapable of making decisions on herown  behalf. 6. Skin/Wound Care: Routine skin checks 7. Fluids/Electrolytes/Nutrition: Routine  I&O with follow-up chemistries upon admit. Encourage PO, 1000 mL fluid intake, 85-100% meal intake. 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 Continue Trileptal 300 mg twice a day           -per neuro no clear-cut seizure activity, possible complicated migraine, Topamax discontinued, Depakote added 9.Hypertension. Norvasc 5 mg daily. Monitor with increased mobility 10.Klebsiella UTI. Ceftin initiated 09/24/2016 transitioning from Rocephin. May have spastic neurogenic bladder in addition to UTI. Hold off on anticholinergic agents until UTI treated, PVR 0-30 cc 11.Hyperlipidemia. Lipitor 12.History of gout. Allopurinol 300 mg daily. Monitor for any gout flareups. No pain at present    13.  Pre renal azotemia, oral intake, improving   BMP Latest Ref Rng & Units 10/03/2016 09/29/2016 09/26/2016  Glucose 65 - 99 mg/dL 88 128(H) 117(H)  BUN 6 - 20 mg/dL 15 14 26(H)  Creatinine 0.44 - 1.00 mg/dL 1.01(H) 0.93 1.22(H)  Sodium 135 - 145 mmol/L 141 139 140  Potassium 3.5 - 5.1 mmol/L 4.0 3.4(L) 4.0  Chloride 101 - 111 mmol/L 109 109 108  CO2 22 - 32 mmol/L 22 21(L) 23  Calcium 8.9 - 10.3 mg/dL 9.7 9.1 9.0   14.  HypoK on potassium, Normal potassium level on 10/03/2016   LOS (Days) 10 A FACE TO FACE EVALUATION WAS PERFORMED  Caidynce Muzyka T 10/05/2016, 9:35 AM

## 2016-10-05 NOTE — Progress Notes (Signed)
Speech Language Pathology Daily Session Note  Patient Details  Name: Savannah Travis MRN: [id number] Date of Birth: November 25, 1940  Today's Date: 10/05/2016 SLP Individual Time: 0930-1000 SLP Individual Time Calculation (min): 30 min   Short Term Goals: Week 2: SLP Short Term Goal 1 (Week 2): Pt will utilize overarticulation and increased vocal intensity to achieve intelligibility in conversations in a minimally distracting environment with supervision.  SLP Short Term Goal 2 (Week 2): Pt will utilize compensatory word finding strategies in conversations with mod I.    Skilled Therapeutic Interventions: Skilled treatment session focused on functional communication/speech intelligibility. SLP facilitated session by providing  Min A verbal cues for use of over articulation/increased vocal intensity to achieve 90% intelligibility at the sentence level during a picture description task with visual barrier. Pt required Mod A verbal cues to achieve ~75% intelligibility at the unstructured simple conversation level. Pt returned to room, left upright in wheelchair with all needs within reach. Continue per plan of care.   Function:    Cognition Comprehension Comprehension assist level: Follows basic conversation/direction with extra time/assistive device  Expression   Expression assist level: Expresses basic 90% of the time/requires cueing < 10% of the time.  Social Interaction Social Interaction assist level: Interacts appropriately 90% of the time - Needs monitoring or encouragement for participation or interaction.  Problem Solving Problem solving assist level: Solves basic 90% of the time/requires cueing < 10% of the time  Memory Memory assist level: More than reasonable amount of time    Pain Pain Assessment Pain Assessment: No/denies pain Pain Score: 0-No pain  Therapy/Group: Individual Therapy  Savannah Travis 10/05/2016, 10:56 AM

## 2016-10-05 NOTE — Progress Notes (Signed)
Patient was working with OT in gym when patient became increasingly fatigued.  Patient was verbally responsive through the event. She complained of left sided chest pain/pressure which resolved after assisted to seated position in wheelchair.  VSS.  Patient assisted back to bed by OT.  Notified Marlowe Shores, PA of findings, no new orders written at this time.  Brita Romp, RN

## 2016-10-06 DIAGNOSIS — N390 Urinary tract infection, site not specified: Secondary | ICD-10-CM

## 2016-10-06 DIAGNOSIS — R569 Unspecified convulsions: Secondary | ICD-10-CM

## 2016-10-06 DIAGNOSIS — F411 Generalized anxiety disorder: Secondary | ICD-10-CM

## 2016-10-06 DIAGNOSIS — I10 Essential (primary) hypertension: Secondary | ICD-10-CM

## 2016-10-06 LAB — URINE CULTURE

## 2016-10-06 NOTE — Progress Notes (Addendum)
Subjective/Complaints: Pt seen laying in bed this AM.  She states she did not get her pain meds at 11:22 last night, like she was supposed to.  She slept well overnight.   ROS: Denies nausea, vomiting, diarrhea, cough, shortness of breath or chest pain  Objective: Vital Signs: Blood pressure (!) 130/58, pulse 69, temperature 97.8 F (36.6 C), temperature source Oral, resp. rate 18, height _0  (1.422 m), weight 91.9 kg (202 lb 8 oz), SpO2 99 %. No results found. Results for orders placed or performed during the hospital encounter of 09/25/16 (from the past 72 hour(s))  Basic metabolic panel     Status: Abnormal   Collection Time: 10/03/16 11:49 AM  Result Value Ref Range   Sodium 141 135 - 145 mmol/L   Potassium 4.0 3.5 - 5.1 mmol/L   Chloride 109 101 - 111 mmol/L   CO2 22 22 - 32 mmol/L   Glucose, Bld 88 65 - 99 mg/dL   BUN 15 6 - 20 mg/dL   Creatinine, Ser 1.01 (H) 0.44 - 1.00 mg/dL   Calcium 9.7 8.9 - 10.3 mg/dL   GFR calc non Af Amer 53 (L) >60 mL/min   GFR calc Af Amer >60 >60 mL/min    Comment: (NOTE) The eGFR has been calculated using the CKD EPI equation. This calculation has not been validated in all clinical situations. eGFR's persistently <60 mL/min signify possible Chronic Kidney Disease.    Anion gap 10 5 - 15  Culture, Urine     Status: Abnormal   Collection Time: 10/05/16 10:25 AM  Result Value Ref Range   Specimen Description URINE, CLEAN CATCH    Special Requests NONE    Culture MULTIPLE SPECIES PRESENT, SUGGEST RECOLLECTION (A)    Report Status 10/06/2016 FINAL      HEENT: Normocephalic, atraumatic.  Cardio: RRR. No JVD. Resp: CTA B/L and Unlabored.  GI: BS positive and nondistended Skin:   Intact. Warm and dry Neuro: Alert/Oriented,  Cranial Nerve II-XII grossly normal Motor: 4+/5 right deltoid, bicep, triceps, grip 4+/5 right hip flexor, knee extensor, ankle dorsiflexor. 4/5 left deltoid, biceps, triceps, grip 4+/5 left hip flexor, knee  extensor, ankle dorsi flexor Tone within Normal Limits Dysarthria Musc/Skel:  No edema, no tenderness Gen. no acute distress. Vital signs reviewed  Assessment/Plan: 1. Functional deficits secondary to left corona radiata infarct, right hemiparesis, aphasia, dysarthria which require 3+ hours per day of interdisciplinary therapy in a comprehensive inpatient rehab setting. Physiatrist is providing close team supervision and 24 hour management of active medical problems listed below. Physiatrist and rehab team continue to assess barriers to discharge/monitor patient progress toward functional and medical goals. FIM: Function - Bathing Position: Shower Body parts bathed by patient: Right arm, Left arm, Chest, Abdomen, Front perineal area, Right upper leg, Left upper leg, Buttocks, Right lower leg, Left lower leg Body parts bathed by helper: Back Bathing not applicable: Right lower leg, Left lower leg Assist Level: Touching or steadying assistance(Pt > 75%)  Function- Upper Body Dressing/Undressing What is the patient wearing?: Bra, Pull over shirt/dress Bra - Perfomed by patient: Thread/unthread right bra strap, Thread/unthread left bra strap Bra - Perfomed by helper: Hook/unhook bra (pull down sports bra) Pull over shirt/dress - Perfomed by patient: Thread/unthread right sleeve, Thread/unthread left sleeve, Put head through opening, Pull shirt over trunk Assist Level: Supervision or verbal cues Set up : To obtain clothing/put away Function - Lower Body Dressing/Undressing What is the patient wearing?: Pants, Shoes, Ted Hose Position:  Other (comment) (toilet) Underwear - Performed by patient: Thread/unthread right underwear leg, Thread/unthread left underwear leg Underwear - Performed by helper: Pull underwear up/down Pants- Performed by patient: Thread/unthread right pants leg, Thread/unthread left pants leg, Pull pants up/down Pants- Performed by helper: Pull pants up/down Non-skid  slipper socks- Performed by helper: Don/doff right sock, Don/doff left sock Shoes - Performed by patient: Don/doff left shoe Shoes - Performed by helper: Don/doff right shoe, Don/doff left shoe, Fasten right, Fasten left TED Hose - Performed by helper: Don/doff right TED hose, Don/doff left TED hose Assist for footwear: Dependant Assist for lower body dressing:  (mod A)  Function - Toileting Toileting steps completed by patient: Adjust clothing prior to toileting, Performs perineal hygiene, Adjust clothing after toileting Toileting steps completed by helper: Adjust clothing after toileting Toileting Assistive Devices: Grab bar or rail Assist level: Touching or steadying assistance (Pt.75%)  Function - Air cabin crew transfer assistive device: Elevated toilet seat/BSC over toilet, Grab bar Assist level to toilet: Touching or steadying assistance (Pt > 75%) Assist level from toilet: Maximal assist (Pt 25 - 49%/lift and lower) Assist level to bedside commode (at bedside): Touching or steadying assistance (Pt > 75%) Assist level from bedside commode (at bedside): Touching or steadying assistance (Pt > 75%)  Function - Chair/bed transfer Chair/bed transfer activity did not occur: N/A Chair/bed transfer method: Stand pivot Chair/bed transfer assist level: Touching or steadying assistance (Pt > 75%) Chair/bed transfer assistive device: Walker, Armrests Chair/bed transfer details: Verbal cues for safe use of DME/AE, Verbal cues for technique  Function - Locomotion: Wheelchair Will patient use wheelchair at discharge?: Yes Type: Manual Max wheelchair distance: 40f Assist Level: Supervision or verbal cues Assist Level: Supervision or verbal cues Function - Locomotion: Ambulation Assistive device: Walker-rolling Max distance: 75 Assist level: Touching or steadying assistance (Pt > 75%) Assist level: Supervision or verbal cues Walk 50 feet with 2 turns activity did not occur:  Safety/medical concerns (L knee painful, R knee weak) Assist level: Touching or steadying assistance (Pt > 75%) Walk 10 feet on uneven surfaces activity did not occur: Safety/medical concerns (L knee painful, R knee weak) Assist level: Moderate assist (Pt 50 - 74%)  Function - Comprehension Comprehension: Auditory Comprehension assist level: Follows basic conversation/direction with extra time/assistive device  Function - Expression Expression: Verbal Expression assist level: Expresses basic 90% of the time/requires cueing < 10% of the time.  Function - Social Interaction Social Interaction assist level: Interacts appropriately 90% of the time - Needs monitoring or encouragement for participation or interaction.  Function - Problem Solving Problem solving assist level: Solves basic 90% of the time/requires cueing < 10% of the time  Function - Memory Memory assist level: More than reasonable amount of time Patient normally able to recall (first 3 days only): Current season, Location of own room, Staff names and faces, That he or she is in a hospital   Medical Problem List and Plan: 1.Decreased functional mobility with lower extremity weakness/altered mental status/question syncopesecondary to left corona radiata infarct  MRI brain reviewed, see above, also with M1 stenosis  Chart history reviewed Cont CIR  2. DVT Prophylaxis/Anticoagulation: SCDs. Monitor for any signs of DVT 3. Pain Management/headaches: Topamax 25 mg twice a day, chest pain, musculoskeletal chest wall, sports cream, has had cardiac workup for chest pain which was negative. Repeat EKG times 2 reviewed, negative. No pain during physical therapy. Left knee pain, advanced osteoarthritis, medial compartment, as well as patellofemoral, moderate at the lateral compartment.  -  Cont Voltaren gel  - knee orthosis 4. Mood: Ludiomil 50 mg daily at bedtime.Klonopin 1 mg daily at bedtime---pt would like this  scheduled 5. Neuropsych: This patient iscapable of making decisions on herown behalf. 6. Skin/Wound Care: Routine skin checks 7. 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 Continue Trileptal 300 mg twice a day           -per neuro no clear-cut seizure activity, possible complicated migraine, Topamax discontinued, Depakote added 9.Hypertension. Norvasc 5 mg daily. Monitor with increased mobility  Controlled on 1/13 10.Klebsiella UTI. Ceftin completed  May have spastic neurogenic bladder in addition to UTI.   ?Recurrent UTI  UA+, Ucx multiple species, reordered 1/13 11.Hyperlipidemia. Lipitor 12.History of gout. Allopurinol 300 mg daily. Monitor for any gout flareups. No pain at present    13.  Pre renal azotemia, oral intake, improving   14.  HypoK: Resolved  On potassium, Normal potassium level on 10/03/2016  Labs ordered for Monday    LOS (Days) 11 A FACE TO FACE EVALUATION WAS PERFORMED  Ankit Lorie Phenix 10/06/2016, 10:13 AM

## 2016-10-07 DIAGNOSIS — R7989 Other specified abnormal findings of blood chemistry: Secondary | ICD-10-CM

## 2016-10-07 NOTE — Progress Notes (Signed)
Subjective/Complaints: Pt seen laying in bed this AM.  She slept well overnight.  She denies complaints this AM.   ROS: Denies nausea, vomiting, diarrhea, shortness of breath or chest pain  Objective: Vital Signs: Blood pressure 115/68, pulse 69, temperature 97.5 F (36.4 C), temperature source Oral, resp. rate 18, height 4\' 8"  (1.422 m), weight 91.9 kg (202 lb 8 oz), SpO2 96 %. No results found. Results for orders placed or performed during the hospital encounter of 09/25/16 (from the past 72 hour(s))  Culture, Urine     Status: Abnormal   Collection Time: 10/05/16 10:25 AM  Result Value Ref Range   Specimen Description URINE, CLEAN CATCH    Special Requests NONE    Culture MULTIPLE SPECIES PRESENT, SUGGEST RECOLLECTION (A)    Report Status 10/06/2016 FINAL      HEENT: Normocephalic, atraumatic.  Cardio: RRR. No JVD. Resp: CTA B/L and Unlabored.  GI: BS positive and nondistended Skin:   Intact. Warm and dry Neuro: Alert/Oriented,  Cranial Nerve II-XII grossly normal Motor: 4+/5 right deltoid, bicep, triceps, grip 4+/5 right hip flexor, knee extensor, ankle dorsiflexor. 4/5 left deltoid, biceps, triceps, grip 4+/5 left hip flexor, knee extensor, ankle dorsi flexor No increase in tone noted Dysarthria Musc/Skel:  No edema, no tenderness Gen. no acute distress. Vital signs reviewed  Assessment/Plan: 1. Functional deficits secondary to left corona radiata infarct, right hemiparesis, aphasia, dysarthria which require 3+ hours per day of interdisciplinary therapy in a comprehensive inpatient rehab setting. Physiatrist is providing close team supervision and 24 hour management of active medical problems listed below. Physiatrist and rehab team continue to assess barriers to discharge/monitor patient progress toward functional and medical goals. FIM: Function - Bathing Position: Shower Body parts bathed by patient: Right arm, Left arm, Chest, Abdomen, Front perineal area,  Right upper leg, Left upper leg, Buttocks, Right lower leg, Left lower leg Body parts bathed by helper: Back Bathing not applicable: Right lower leg, Left lower leg Assist Level: Touching or steadying assistance(Pt > 75%)  Function- Upper Body Dressing/Undressing What is the patient wearing?: Bra, Pull over shirt/dress Bra - Perfomed by patient: Thread/unthread right bra strap, Thread/unthread left bra strap Bra - Perfomed by helper: Hook/unhook bra (pull down sports bra) Pull over shirt/dress - Perfomed by patient: Thread/unthread right sleeve, Thread/unthread left sleeve, Put head through opening, Pull shirt over trunk Assist Level: Supervision or verbal cues Set up : To obtain clothing/put away Function - Lower Body Dressing/Undressing What is the patient wearing?: Pants, Shoes, Ted Hose Position: Other (comment) (toilet) Underwear - Performed by patient: Thread/unthread right underwear leg, Thread/unthread left underwear leg Underwear - Performed by helper: Pull underwear up/down Pants- Performed by patient: Thread/unthread right pants leg, Thread/unthread left pants leg, Pull pants up/down Pants- Performed by helper: Pull pants up/down Non-skid slipper socks- Performed by helper: Don/doff right sock, Don/doff left sock Shoes - Performed by patient: Don/doff left shoe Shoes - Performed by helper: Don/doff right shoe, Don/doff left shoe, Fasten right, Fasten left TED Hose - Performed by helper: Don/doff right TED hose, Don/doff left TED hose Assist for footwear: Dependant Assist for lower body dressing:  (mod A)  Function - Toileting Toileting steps completed by patient: Adjust clothing prior to toileting, Performs perineal hygiene, Adjust clothing after toileting Toileting steps completed by helper: Adjust clothing after toileting Toileting Assistive Devices: Grab bar or rail Assist level: Touching or steadying assistance (Pt.75%)  Function - Toilet Transfers Toilet transfer  assistive device: Elevated toilet seat/BSC over toilet,  Grab bar Assist level to toilet: Touching or steadying assistance (Pt > 75%) Assist level from toilet: Maximal assist (Pt 25 - 49%/lift and lower) Assist level to bedside commode (at bedside): Touching or steadying assistance (Pt > 75%) Assist level from bedside commode (at bedside): Touching or steadying assistance (Pt > 75%)  Function - Chair/bed transfer Chair/bed transfer activity did not occur: N/A Chair/bed transfer method: Stand pivot Chair/bed transfer assist level: Touching or steadying assistance (Pt > 75%) Chair/bed transfer assistive device: Walker, Armrests Chair/bed transfer details: Verbal cues for safe use of DME/AE, Verbal cues for technique  Function - Locomotion: Wheelchair Will patient use wheelchair at discharge?: Yes Type: Manual Max wheelchair distance: 22ft Assist Level: Supervision or verbal cues Assist Level: Supervision or verbal cues Function - Locomotion: Ambulation Assistive device: Walker-rolling Max distance: 75 Assist level: Touching or steadying assistance (Pt > 75%) Assist level: Supervision or verbal cues Walk 50 feet with 2 turns activity did not occur: Safety/medical concerns (L knee painful, R knee weak) Assist level: Touching or steadying assistance (Pt > 75%) Walk 10 feet on uneven surfaces activity did not occur: Safety/medical concerns (L knee painful, R knee weak) Assist level: Moderate assist (Pt 50 - 74%)  Function - Comprehension Comprehension: Auditory Comprehension assist level: Follows basic conversation/direction with extra time/assistive device  Function - Expression Expression: Verbal Expression assist level: Expresses basic 90% of the time/requires cueing < 10% of the time.  Function - Social Interaction Social Interaction assist level: Interacts appropriately 90% of the time - Needs monitoring or encouragement for participation or interaction.  Function - Problem  Solving Problem solving assist level: Solves basic 90% of the time/requires cueing < 10% of the time  Function - Memory Memory assist level: More than reasonable amount of time Patient normally able to recall (first 3 days only): Current season, Location of own room, Staff names and faces, That he or she is in a hospital   Medical Problem List and Plan: 1.Decreased functional mobility with lower extremity weakness/altered mental status/question syncopesecondary to left corona radiata infarct Cont CIR  2. DVT Prophylaxis/Anticoagulation: SCDs. Monitor for any signs of DVT 3. Pain Management/headaches: Topamax 25 mg twice a day, chest pain, musculoskeletal chest wall, sports cream, has had cardiac workup for chest pain which was negative. Repeat EKG times 2 reviewed, negative. No pain during physical therapy. Left knee pain, advanced osteoarthritis, medial compartment, as well as patellofemoral, moderate at the lateral compartment.  -Cont Voltaren gel  - knee orthosis 4. Mood: Ludiomil 50 mg daily at bedtime.Klonopin 1 mg daily at bedtime---pt would like this scheduled 5. Neuropsych: This patient iscapable of making decisions on herown behalf. 6. Skin/Wound Care: Routine skin checks 7. 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 Continue Trileptal 300 mg twice a day           -per neuro no clear-cut seizure activity, possible complicated migraine, Topamax discontinued, Depakote added 9.Hypertension. Norvasc 5 mg daily. Monitor with increased mobility  Controlled on 1/14 10.Klebsiella UTI. Ceftin completed  May have spastic neurogenic bladder in addition to UTI.   ?Recurrent UTI  UA+, Ucx multiple species, reordered 1/13, pending 11.Hyperlipidemia. Lipitor 12.History of gout. Allopurinol 300 mg daily. Monitor for any gout flareups. No pain at present    13.  Pre renal azotemia, oral intake, improving     Labs ordered for tomorrow 14.  HypoK: Resolved  On potassium, Normal potassium level on 10/03/2016  Labs ordered for Monday  LOS (Days) 12 A FACE TO FACE EVALUATION WAS PERFORMED  Ankit Lorie Phenix 10/07/2016, 8:09 AM

## 2016-10-08 ENCOUNTER — Inpatient Hospital Stay (HOSPITAL_COMMUNITY): Payer: Medicare Other | Admitting: Speech Pathology

## 2016-10-08 ENCOUNTER — Inpatient Hospital Stay (HOSPITAL_COMMUNITY): Payer: Medicare Other | Admitting: Occupational Therapy

## 2016-10-08 ENCOUNTER — Ambulatory Visit (HOSPITAL_COMMUNITY): Payer: Medicare Other

## 2016-10-08 LAB — BASIC METABOLIC PANEL
ANION GAP: 8 (ref 5–15)
BUN: 23 mg/dL — AB (ref 6–20)
CHLORIDE: 107 mmol/L (ref 101–111)
CO2: 24 mmol/L (ref 22–32)
Calcium: 8.9 mg/dL (ref 8.9–10.3)
Creatinine, Ser: 1.15 mg/dL — ABNORMAL HIGH (ref 0.44–1.00)
GFR, EST AFRICAN AMERICAN: 53 mL/min — AB (ref >60)
GFR, EST NON AFRICAN AMERICAN: 45 mL/min — AB (ref >60)
Glucose, Bld: 92 mg/dL (ref 65–99)
POTASSIUM: 3.8 mmol/L (ref 3.5–5.1)
SODIUM: 139 mmol/L (ref 135–145)

## 2016-10-08 LAB — CBC WITH DIFFERENTIAL/PLATELET
BASOS ABS: 0 10*3/uL (ref 0.0–0.1)
Basophils Relative: 1 %
EOS PCT: 3 %
Eosinophils Absolute: 0.2 10*3/uL (ref 0.0–0.7)
HCT: 36.6 % (ref 36.0–46.0)
HEMOGLOBIN: 11.4 g/dL — AB (ref 12.0–15.0)
LYMPHS ABS: 1.9 10*3/uL (ref 0.7–4.0)
LYMPHS PCT: 34 %
MCH: 28.1 pg (ref 26.0–34.0)
MCHC: 31.1 g/dL (ref 30.0–36.0)
MCV: 90.1 fL (ref 78.0–100.0)
Monocytes Absolute: 0.5 10*3/uL (ref 0.1–1.0)
Monocytes Relative: 8 %
NEUTROS ABS: 3.2 10*3/uL (ref 1.7–7.7)
NEUTROS PCT: 54 %
PLATELETS: 277 10*3/uL (ref 150–400)
RBC: 4.06 MIL/uL (ref 3.87–5.11)
RDW: 14.1 % (ref 11.5–15.5)
WBC: 5.7 10*3/uL (ref 4.0–10.5)

## 2016-10-08 LAB — URINALYSIS, ROUTINE W REFLEX MICROSCOPIC
Bilirubin Urine: NEGATIVE
GLUCOSE, UA: NEGATIVE mg/dL
Hgb urine dipstick: NEGATIVE
Ketones, ur: NEGATIVE mg/dL
Nitrite: POSITIVE — AB
PH: 5 (ref 5.0–8.0)
PROTEIN: NEGATIVE mg/dL
SPECIFIC GRAVITY, URINE: 1.016 (ref 1.005–1.030)
SQUAMOUS EPITHELIAL / LPF: NONE SEEN

## 2016-10-08 MED ORDER — CIPROFLOXACIN HCL 250 MG PO TABS
250.0000 mg | ORAL_TABLET | Freq: Two times a day (BID) | ORAL | Status: DC
  Administered 2016-10-08 – 2016-10-12 (×8): 250 mg via ORAL
  Filled 2016-10-08 (×8): qty 1

## 2016-10-08 NOTE — Progress Notes (Signed)
Social Work Patient ID: Savannah Travis, female   DOB: 1940/12/27, 76 y.o.   MRN: [id number]  Met with pt, husband and daughter who were here for family education to prepare for discharge on Friday. All report it is going well and husband and daughter feel they can provide the care pt will need at discharge. They will need a wheelchair, she has other pieces of equipment and will begin with home Health therapies.  Pt prefers the agency husband had in [place]. Will contact them to make referral.

## 2016-10-08 NOTE — Progress Notes (Signed)
Pt requires moderate assistance w/cues to ambulate via walker to bathroom. Nursing assistant states pt will stop and "forget" to move her feet and has to be prompted, almost like she is trying to fall asleep while standing up. Pt has been appropriate and oriented on this shift and is currently sleeping, breathing unlabored. No skin breakdown noted.  Rayann Heman

## 2016-10-08 NOTE — Plan of Care (Signed)
Problem: RH SAFETY Goal: RH STG ADHERE TO SAFETY PRECAUTIONS W/ASSISTANCE/DEVICE STG Adhere to Safety Precautions With mod Assistance and device  Outcome: Progressing Use call light appropriately.

## 2016-10-08 NOTE — Progress Notes (Signed)
Occupational Therapy Session Note  Patient Details  Name: Savannah Travis MRN: [id number] Date of Birth: [birth year]  Today's Date: 10/08/2016 OT Individual Time: 1003-1101 OT Individual Time Calculation (min): 58 min     Short Term Goals: Week 2:  OT Short Term Goal 1 (Week 2): Continue working on supervision level goals for discharge.    Skilled Therapeutic Interventions/Progress Updates:    Pt completed bathing and dressing with pt's spouse and daughter present for session.  She was able to ambulate to the shower bench with min assist using the RW.  She was able to bath sit to stand with min assist.  She used LH sponge for washing the lower legs and then sat on the seat to dry off.  Dressing sit to stand at the sink with pt demonstrating ability to thread her pants over her feet and pull over hips with min steady assist.  She needed assist for fastening bra and for donning her TEDs and right shoe.  She utilized the RUE throughout session with supervision for washing the LUE, applying deodorant, and assisting with pulling pants over hips.  Pt left in wheelchair with family present for next session.    Therapy Documentation Precautions:  Precautions Precautions: Fall Restrictions Weight Bearing Restrictions: No  Pain: Pain Assessment Pain Assessment: No/denies pain Pain Score: 0-No pain ADL: See Function Navigator for Current Functional Status.   Therapy/Group: Individual Therapy  Shynice Sigel OTR/L 10/08/2016, 12:39 PM

## 2016-10-08 NOTE — Progress Notes (Signed)
Subjective/Complaints: Family is in for training today. Husband uses assisted device. Patient complains of burning with urination, not during every void, but occurs frequently. No sweats or chills.  ROS: Denies nausea, vomiting, diarrhea, shortness of breath or chest pain  Objective: Vital Signs: Blood pressure 117/60, pulse 76, temperature 97.7 F (36.5 C), temperature source Oral, resp. rate 18, height 4' 8"  (1.422 m), weight 91.9 kg (202 lb 8 oz), SpO2 97 %. No results found. Results for orders placed or performed during the hospital encounter of 09/25/16 (from the past 72 hour(s))  Culture, Urine     Status: Abnormal   Collection Time: 10/05/16 10:25 AM  Result Value Ref Range   Specimen Description URINE, CLEAN CATCH    Special Requests NONE    Culture MULTIPLE SPECIES PRESENT, SUGGEST RECOLLECTION (A)    Report Status 10/06/2016 FINAL   Basic metabolic panel     Status: Abnormal   Collection Time: 10/08/16  5:00 AM  Result Value Ref Range   Sodium 139 135 - 145 mmol/L   Potassium 3.8 3.5 - 5.1 mmol/L   Chloride 107 101 - 111 mmol/L   CO2 24 22 - 32 mmol/L   Glucose, Bld 92 65 - 99 mg/dL   BUN 23 (H) 6 - 20 mg/dL   Creatinine, Ser 1.15 (H) 0.44 - 1.00 mg/dL   Calcium 8.9 8.9 - 10.3 mg/dL   GFR calc non Af Amer 45 (L) >60 mL/min   GFR calc Af Amer 53 (L) >60 mL/min    Comment: (NOTE) The eGFR has been calculated using the CKD EPI equation. This calculation has not been validated in all clinical situations. eGFR's persistently <60 mL/min signify possible Chronic Kidney Disease.    Anion gap 8 5 - 15  CBC with Differential/Platelet     Status: Abnormal   Collection Time: 10/08/16  5:00 AM  Result Value Ref Range   WBC 5.7 4.0 - 10.5 K/uL   RBC 4.06 3.87 - 5.11 MIL/uL   Hemoglobin 11.4 (L) 12.0 - 15.0 g/dL   HCT 36.6 36.0 - 46.0 %   MCV 90.1 78.0 - 100.0 fL   MCH 28.1 26.0 - 34.0 pg   MCHC 31.1 30.0 - 36.0 g/dL   RDW 14.1 11.5 - 15.5 %   Platelets 277 150 - 400  K/uL   Neutrophils Relative % 54 %   Neutro Abs 3.2 1.7 - 7.7 K/uL   Lymphocytes Relative 34 %   Lymphs Abs 1.9 0.7 - 4.0 K/uL   Monocytes Relative 8 %   Monocytes Absolute 0.5 0.1 - 1.0 K/uL   Eosinophils Relative 3 %   Eosinophils Absolute 0.2 0.0 - 0.7 K/uL   Basophils Relative 1 %   Basophils Absolute 0.0 0.0 - 0.1 K/uL     HEENT: Normocephalic, atraumatic.  Cardio: RRR. No JVD. Resp: CTA B/L and Unlabored.  GI: BS positive and nondistended Skin:   Intact. Warm and dry Neuro: Alert/Oriented,  Cranial Nerve II-XII grossly normal Motor: 4+/5 right deltoid, bicep, triceps, grip 4+/5 right hip flexor, knee extensor, ankle dorsiflexor. 4/5 left deltoid, biceps, triceps, grip 4+/5 left hip flexor, knee extensor, ankle dorsi flexor No increase in tone noted Dysarthria Musc/Skel:  No edema, no tenderness Gen. no acute distress. Vital signs reviewed  Assessment/Plan: 1. Functional deficits secondary to left corona radiata infarct, right hemiparesis, aphasia, dysarthria which require 3+ hours per day of interdisciplinary therapy in a comprehensive inpatient rehab setting. Physiatrist is providing close team supervision and  24 hour management of active medical problems listed below. Physiatrist and rehab team continue to assess barriers to discharge/monitor patient progress toward functional and medical goals. FIM: Function - Bathing Position: Shower Body parts bathed by patient: Right arm, Left arm, Chest, Abdomen, Front perineal area, Right upper leg, Left upper leg, Buttocks, Right lower leg, Left lower leg Body parts bathed by helper: Back Bathing not applicable: Right lower leg, Left lower leg Assist Level: Touching or steadying assistance(Pt > 75%)  Function- Upper Body Dressing/Undressing What is the patient wearing?: Bra, Pull over shirt/dress Bra - Perfomed by patient: Thread/unthread right bra strap, Thread/unthread left bra strap Bra - Perfomed by helper: Hook/unhook  bra (pull down sports bra) Pull over shirt/dress - Perfomed by patient: Thread/unthread right sleeve, Thread/unthread left sleeve, Put head through opening, Pull shirt over trunk Assist Level: Supervision or verbal cues Set up : To obtain clothing/put away Function - Lower Body Dressing/Undressing What is the patient wearing?: Pants, Shoes, Ted Hose Position: Other (comment) (toilet) Underwear - Performed by patient: Thread/unthread right underwear leg, Thread/unthread left underwear leg Underwear - Performed by helper: Pull underwear up/down Pants- Performed by patient: Thread/unthread right pants leg, Thread/unthread left pants leg, Pull pants up/down Pants- Performed by helper: Pull pants up/down Non-skid slipper socks- Performed by helper: Don/doff right sock, Don/doff left sock Shoes - Performed by patient: Don/doff left shoe Shoes - Performed by helper: Don/doff right shoe, Don/doff left shoe, Fasten right, Fasten left TED Hose - Performed by helper: Don/doff right TED hose, Don/doff left TED hose Assist for footwear: Dependant Assist for lower body dressing:  (mod A)  Function - Toileting Toileting steps completed by patient: Adjust clothing prior to toileting Toileting steps completed by helper: Adjust clothing after toileting Toileting Assistive Devices: Grab bar or rail Assist level: Touching or steadying assistance (Pt.75%)  Function - Air cabin crew transfer assistive device: Elevated toilet seat/BSC over toilet, Grab bar Assist level to toilet: Touching or steadying assistance (Pt > 75%) Assist level from toilet: Maximal assist (Pt 25 - 49%/lift and lower) Assist level to bedside commode (at bedside): Touching or steadying assistance (Pt > 75%) Assist level from bedside commode (at bedside): Touching or steadying assistance (Pt > 75%)  Function - Chair/bed transfer Chair/bed transfer activity did not occur: N/A Chair/bed transfer method: Stand pivot Chair/bed  transfer assist level: Touching or steadying assistance (Pt > 75%) Chair/bed transfer assistive device: Walker, Armrests Chair/bed transfer details: Verbal cues for safe use of DME/AE, Verbal cues for technique  Function - Locomotion: Wheelchair Will patient use wheelchair at discharge?: Yes Type: Manual Max wheelchair distance: 63f Assist Level: Supervision or verbal cues Assist Level: Supervision or verbal cues Function - Locomotion: Ambulation Assistive device: Walker-rolling Max distance: 75 Assist level: Touching or steadying assistance (Pt > 75%) Assist level: Supervision or verbal cues Walk 50 feet with 2 turns activity did not occur: Safety/medical concerns (L knee painful, R knee weak) Assist level: Touching or steadying assistance (Pt > 75%) Walk 10 feet on uneven surfaces activity did not occur: Safety/medical concerns (L knee painful, R knee weak) Assist level: Moderate assist (Pt 50 - 74%)  Function - Comprehension Comprehension: Auditory Comprehension assist level: Follows basic conversation/direction with extra time/assistive device  Function - Expression Expression: Verbal Expression assist level: Expresses basic 90% of the time/requires cueing < 10% of the time.  Function - Social Interaction Social Interaction assist level: Interacts appropriately 90% of the time - Needs monitoring or encouragement for participation or interaction.  Function - Problem Solving Problem solving assist level: Solves basic 90% of the time/requires cueing < 10% of the time  Function - Memory Memory assist level: More than reasonable amount of time Patient normally able to recall (first 3 days only): Current season, Location of own room, Staff names and faces, That he or she is in a hospital   Medical Problem List and Plan: 1.Decreased functional mobility with lower extremity weakness/altered mental status/question syncopesecondary to left corona radiata infarct Cont  CIR PT, OT, speech therapy with focus on family training prior to discharge 2. DVT Prophylaxis/Anticoagulation: SCDs. Monitor for any signs of DVT 3. Pain Management/headaches: Topamax 25 mg twice a day, chest pain, musculoskeletal chest wall, sports cream, has had cardiac workup for chest pain which was negative. Repeat EKG times 2 reviewed, negative. No pain during physical therapy. Left knee pain, advanced osteoarthritis, medial compartment, as well as patellofemoral, moderate at the lateral compartment.  -Cont Voltaren gel  - knee orthosis 4. Mood: Ludiomil 50 mg daily at bedtime.Klonopin 1 mg daily at bedtime---pt would like this scheduled 5. Neuropsych: This patient iscapable of making decisions on herown behalf. 6. Skin/Wound Care: Routine skin checks 7. 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 Continue Trileptal 300 mg twice a day           -per neuro no clear-cut seizure activity, possible complicated migraine, Topamax discontinued, Depakote added 9.Hypertension. Norvasc 5 mg daily. Monitor with increased mobility, blood pressure is controlled  Blood pressure 117/60, pulse 76, temperature 97.7 F (36.5 C), temperature source Oral, resp. rate 18, height 4' 8"  (1.422 m), weight 91.9 kg (202 lb 8 oz), SpO2 97 %.  Vitals:   10/07/16 1404 10/08/16 0500  BP: 135/60 117/60  Pulse: 78 76  Resp: 18 18  Temp: 97.9 F (36.6 C) 97.7 F (36.5 C)   10.Klebsiella UTI. Ceftin completed  May have spastic neurogenic bladder in addition to UTI.   ?Recurrent UTI  UA+, Ucx multiple species, we will need catheterized specimen 11.Hyperlipidemia. Lipitor 12.History of gout. Allopurinol 300 mg daily. Monitor for any gout flareups. No pain at present    13.  Pre renal azotemia, oral intake, improving    Labs ordered for tomorrow 14.  HypoK: Resolved  On potassium, Normal potassium level on 10/03/2016  Labs ordered for  Monday    LOS (Days) 13 A FACE TO FACE EVALUATION WAS PERFORMED  Yeiden Frenkel E 10/08/2016, 10:22 AM

## 2016-10-08 NOTE — Progress Notes (Signed)
Physical Therapy Note  Patient Details  Name: Savannah Travis MRN: [id number] Date of Birth: August 25, 1941 Today's Date: 10/08/2016  1100-1210, 70 min individual tx Pain: none reported at rest; briefly 10/10 L knee with L SLS while backing up to w/c  Family ed with dtr Jenny Reichmann and husband Clair Gulling.  Cindy traned in donning and doffing L knee brace and simulated car transfer, gait and stand pivot transfer.  Jim trained in bed mobility. Pt continues to need cues for hand placement with RW sit>< stand.  During short distance gait, pt had LOB R due to RLE catching on floor and inadequate wt shift to L, requiring mod assist. Cindy trained in appropriate guarding for pt's LOB.  Gait up/down ramp as per home entrance. Family pushed pt back to room at end of session.   Nicolaus Andel 10/08/2016, 11:40 AM

## 2016-10-08 NOTE — Progress Notes (Signed)
Speech Language Pathology Daily Session Note  Patient Details  Name: Savannah Travis MRN: [id number] Date of Birth: 1940-11-13  Today's Date: 10/08/2016 SLP Individual Time: 1303-1330 SLP Individual Time Calculation (min): 27 min   Short Term Goals: Week 2: SLP Short Term Goal 1 (Week 2): Pt will utilize overarticulation and increased vocal intensity to achieve intelligibility in conversations in a minimally distracting environment with supervision.  SLP Short Term Goal 2 (Week 2): Pt will utilize compensatory word finding strategies in conversations with mod I.    Skilled Therapeutic Interventions:  Pt was seen for skilled ST targeting family education.  Pt's daughter and husband were present and updated regarding pt's current goals and progress in therapies during today's therapy session.  Both feel that pt's speech is improved in comparison to upon admission.  SLP provided skilled education regarding compensatory word finding strategies as well as speech intelligibility strategies.  Pt already had a handout of strategies which therapist referred to frequently during education.  All questions were answered to pt's and family's satisfaction and all members were in agreement with recommended plan of care.  Pt was left in wheelchair with family at bedside.  Continue per current plan of care.      Function:  Eating Eating                 Cognition Comprehension Comprehension assist level: Follows basic conversation/direction with extra time/assistive device  Expression   Expression assist level: Expresses basic 90% of the time/requires cueing < 10% of the time.  Social Interaction Social Interaction assist level: Interacts appropriately 90% of the time - Needs monitoring or encouragement for participation or interaction.  Problem Solving Problem solving assist level: Solves basic 90% of the time/requires cueing < 10% of the time  Memory Memory assist level: Recognizes or recalls 75 - 89% of  the time/requires cueing 10 - 24% of the time    Pain Pain Assessment Pain Assessment: No/denies pain   Therapy/Group: Individual Therapy  Shakirah Kirkey, Selinda Orion 10/08/2016, 4:05 PM

## 2016-10-08 NOTE — Progress Notes (Signed)
Occupational Therapy Session Note  Patient Details  Name: Savannah Travis MRN: [id number] Date of Birth: 03-Apr-1941  Today's Date: 10/08/2016 OT Individual Time: 1420-1501 OT Individual Time Calculation (min): 41 min     Short Term Goals: Week 2:  OT Short Term Goal 1 (Week 2): Continue working on supervision level goals for discharge.    Skilled Therapeutic Interventions/Progress Updates:    Pt practiced walk-in shower transfers with daughter in simulated setup.  She needed min assist to complete transfer, stepping over the edge of the walk-in shower with use of the RW.  Also had pt's spouse practice transfers with the RW using the elevated toilet with rail.  Min assist needed for this as well with LOB posteriorly on 2 occasions when attempting to step backwards.  Pt's RLE tends to lag behind with stepping resulting in more of a tandem stance with increased posterior lean and LOB.  Educated family on need for gait belt for safety as well.  Issued FM coordination handout and reviewed with pt/family at end of session.  Pt left in wheelchair at end of session at bedside.   Therapy Documentation Precautions:  Precautions Precautions: Fall Restrictions Weight Bearing Restrictions: No  Pain: Pain Assessment Pain Assessment: Faces Faces Pain Scale: Hurts a little bit Pain Type: Acute pain Pain Location: Knee Pain Orientation: Left ADL: See Function Navigator for Current Functional Status.   Therapy/Group: Individual Therapy  Savannah Travis OTR/L 10/08/2016, 3:55 PM

## 2016-10-09 ENCOUNTER — Inpatient Hospital Stay (HOSPITAL_COMMUNITY): Payer: Medicare Other

## 2016-10-09 ENCOUNTER — Inpatient Hospital Stay (HOSPITAL_COMMUNITY): Payer: Medicare Other | Admitting: Physical Therapy

## 2016-10-09 ENCOUNTER — Inpatient Hospital Stay (HOSPITAL_COMMUNITY): Payer: Medicare Other | Admitting: Speech Pathology

## 2016-10-09 ENCOUNTER — Inpatient Hospital Stay (HOSPITAL_COMMUNITY): Payer: Medicare Other | Admitting: Occupational Therapy

## 2016-10-09 NOTE — Progress Notes (Signed)
Physical Therapy Session Note  Patient Details  Name: Savannah Travis MRN: [id number] Date of Birth: 1941/08/03  Today's Date: 10/09/2016 PT Individual Time: [id number] PT Individual Time Calculation (min): 43 min    Short Term Goals: Week 2:  PT Short Term Goal 1 (Week 2): STG =LTG due to ELOS  Skilled Therapeutic Interventions/Progress Updates:    Pt received in w/c & agreeable to tx. Pt denied c/o pain at beginning of session. Pt propelled w/c x 100 ft with BUE & BLE with impaired coordination of 4 extremities and impaired use of RUE; pt requires significantly extra time to propel w/c. In gym pt stood on wedge ~1-2 minutes without BUE support while reaching outside of BOS for cups. Pt noted significant fatigue after task with intermittent eyes closing; vitals WNL (noted below). Allowed pt rest break then pt ambulated 50 ft with RW & min assist. Pt with poor weight shifting left with therapist providing manual facilitation to correct; pt unable to shift weight to L enough to advance RLE. Pt demonstrates poor foot clearance RLE, at times sliding foot across floor, and with very short step length. Pt ambulated 50 ft before requesting to sit down due to fatigue. Upon sitting pt noted her head hurt and she felt as if she were going to have a seizure; RN immediately made aware. Pt assisted back to bed with assistance from RN.   Therapy Documentation Precautions:  Precautions Precautions: Fall Restrictions Weight Bearing Restrictions: No    Vital Signs: Therapy Vitals Pulse Rate: 74 BP: (!) 152/61 Patient Position (if appropriate): Sitting Oxygen Therapy SpO2: 100 % O2 Device: Not Delivered   See Function Navigator for Current Functional Status.   Therapy/Group: Individual Therapy  Waunita Schooner 10/09/2016, 12:34 PM

## 2016-10-09 NOTE — Progress Notes (Signed)
Occupational Therapy Session Note  Patient Details  Name: Savannah Travis MRN: [id number] Date of Birth: May 28, 1941  Today's Date: 10/09/2016 OT Individual Time:  -  9:00-10:00 (44mn)    Short Term Goals: Week 1:  OT Short Term Goal 1 (Week 1): Pt will completed LB bathing with min assist sit to stand.  OT Short Term Goal 1 - Progress (Week 1): Met OT Short Term Goal 2 (Week 1): Pt will complete LB dressing sit to stand with min assist.  OT Short Term Goal 2 - Progress (Week 1): Met OT Short Term Goal 3 (Week 1): Pt will complete stand pivot toilet transfers with min asisst using the RW.  OT Short Term Goal 3 - Progress (Week 1): Met OT Short Term Goal 4 (Week 1): Pt will complete walk-in shower transfers with min assist using the RW.  OT Short Term Goal 4 - Progress (Week 1): Met OT Short Term Goal 5 (Week 1): Pt will use the RUE to comb her hair with supervision.  OT Short Term Goal 5 - Progress (Week 1): Met Week 2:  OT Short Term Goal 1 (Week 2): Continue working on supervision level goals for discharge.    Skilled Therapeutic Interventions/Progress Updates:    Pt noted with significant bruising on right flank and right LE below her knee.  1:1 self care retraining at shower level with focus on functional ambulation around the room with RW with more than reasonable amt of time and supervision to steadying A. Pt with lean posterior at times requiring min A to steady her balance. Pt able to bathe with setup A with AE prn. Practiced simulated situation of stepping in and out of shower stall with ledge with min A. Pt dressed in bathroom in low w/c as she has planned at home. Pt dressed with more than reasonable time with AE. Pt with heavy/ labored breathing with actiivity but vital were WFL just needed extra time and rest breaks. Pt reports difficulty still with toileting hygiene after BM but able to complete bathing with close supervision with grab bar. Pt also able to don black mary jane shoes  with setup but requires A with tennis shoes with elastic shoe laces. Pt utilized her right at diminished to non dominant level without cuing throughout session with extra time. Pt left in w/c reading the newspaper with call bell.   Therapy Documentation Precautions:  Precautions Precautions: Fall Restrictions Weight Bearing Restrictions: No Pain: No c/ o pain in session  See Function Navigator for Current Functional Status.   Therapy/Group: Individual Therapy  SWilleen CassLEye [facility]1/16/2018, 1:19 PM

## 2016-10-09 NOTE — Progress Notes (Signed)
Speech Language Pathology Daily Session Note  Patient Details  Name: Savannah Travis MRN: [id number] Date of Birth: Sep 15, 1941  Today's Date: 10/09/2016 SLP Individual Time: 1430-1500 SLP Individual Time Calculation (min): 30 min and Today's Date: 10/09/2016 SLP Missed Time: 15 Minutes Missed Time Reason: Pain    Short Term Goals: Week 2: SLP Short Term Goal 1 (Week 2): Pt will utilize overarticulation and increased vocal intensity to achieve intelligibility in conversations in a minimally distracting environment with supervision.  SLP Short Term Goal 2 (Week 2): Pt will utilize compensatory word finding strategies in conversations with mod I.    Skilled Therapeutic Interventions:   Skilled treatment session focused on addressing language, word finding goals.  Patient participated in structured generative naming task that she completed Mod I with increased time.  Patient Mod I for expression of needs and wants as well as abstract ideas dicussed regarding discharge planning.  Patient also Mod I for intelligibility in a mildly distracting environment throughout session.  Plan for phone call or ordering task for next visit with an unfamiliar listener.  Continue with current plan of care.    Function:  Cognition Comprehension Comprehension assist level: Follows basic conversation/direction with extra time/assistive device  Expression   Expression assist level: Expresses basic needs/ideas: With extra time/assistive device  Social Interaction Social Interaction assist level: Interacts appropriately 90% of the time - Needs monitoring or encouragement for participation or interaction.  Problem Solving Problem solving assist level: Solves basic 90% of the time/requires cueing < 10% of the time  Memory Memory assist level: Recognizes or recalls 75 - 89% of the time/requires cueing 10 - 24% of the time    Pain Pain Assessment Pain Assessment:  (pt reports headache that initially was bad but let up  following meds and a little rest) Pain Score: Asleep Pain Type: Acute pain Pain Location: Head Pain Descriptors / Indicators: Aching Pain Frequency: Constant Pain Onset: Sudden Patients Stated Pain Goal: 3 Pain Intervention(s): Medication (See eMAR)  Therapy/Group: Individual Therapy  Carmelia Roller., CCC-SLP [id number]  [place] 10/09/2016, 3:18 PM

## 2016-10-09 NOTE — Plan of Care (Signed)
Problem: RH Dressing Goal: LTG Patient will perform upper body dressing (OT) LTG Patient will perform upper body dressing with assist, with/without cues (OT).  Goal downgraded secondary to fastening bra and pt needing assist for this.   Problem: RH Simple Meal Prep Goal: LTG Patient will perform simple meal prep w/assist (OT) LTG: Patient will perform simple meal prep with assistance, with/without cues (OT).  Goal downgraded based on endurance and motor planning issues with the LLE.

## 2016-10-09 NOTE — Progress Notes (Signed)
Physical Therapy Session Note  Patient Details  Name: Savannah Travis MRN: [id number] Date of Birth: 04/12/1941  Today's Date: 10/09/2016 PT Individual Time: 1300-1346 PT Individual Time Calculation (min): 46 min    Short Term Goals: Week 2:  PT Short Term Goal 1 (Week 2): STG =LTG due to ELOS  Skilled Therapeutic Interventions/Progress Updates:    Session limited due to headache and modified treatment to occur in quiet room but pt agreeable to attempt session. D/c planning discussed and education on home set-up to reduce fall risk in the home (clearing spaces, no floor rugs, turning on lights, etc) during therapeutic rest breaks. Instructed in BLE strengthening and balance exercises including LAQ with 5 second hold, standing hip abduction, and standing heel/toe raises x 10 reps each with rest breaks between exercises.  Therapy Documentation Precautions:  Precautions Precautions: Fall Restrictions Weight Bearing Restrictions: No  Pain: c/o headache since this morning 5/10. Premedicated per report.    See Function Navigator for Current Functional Status.   Therapy/Group: Individual Therapy  [name], PT, DPT  10/09/2016, 2:58 PM

## 2016-10-10 ENCOUNTER — Inpatient Hospital Stay (HOSPITAL_COMMUNITY): Payer: Medicare Other | Admitting: Speech Pathology

## 2016-10-10 ENCOUNTER — Inpatient Hospital Stay (HOSPITAL_COMMUNITY): Payer: Medicare Other

## 2016-10-10 ENCOUNTER — Inpatient Hospital Stay (HOSPITAL_COMMUNITY): Payer: Medicare Other | Admitting: Occupational Therapy

## 2016-10-10 ENCOUNTER — Inpatient Hospital Stay (HOSPITAL_COMMUNITY): Payer: Medicare Other | Admitting: *Deleted

## 2016-10-10 ENCOUNTER — Inpatient Hospital Stay (HOSPITAL_COMMUNITY): Payer: Medicare Other | Admitting: Physical Therapy

## 2016-10-10 MED ORDER — PANTOPRAZOLE SODIUM 40 MG PO TBEC
40.0000 mg | DELAYED_RELEASE_TABLET | Freq: Every day | ORAL | Status: DC
  Administered 2016-10-10 – 2016-10-12 (×3): 40 mg via ORAL
  Filled 2016-10-10 (×3): qty 1

## 2016-10-10 NOTE — Progress Notes (Signed)
Social Work Elease Hashimoto, LCSW Social Worker Signed   Patient Care Conference Date of Service: 10/10/2016 12:24 PM      Hide copied text Hover for attribution information Inpatient RehabilitationTeam Conference and Plan of Care Update Date: 10/10/2016   Time: 10:30 AM      Patient Name: Savannah Travis      Medical Record Number: [id number]  Date of Birth: May 15, 1941 Sex: Female         Room/Bed: 4W07C/4W07C-01 Payor Info: Payor: MEDICARE / Plan: MEDICARE PART A AND B / Product Type: *No Product type* /     Admitting Diagnosis: CVA  Admit Date/Time:  09/25/2016  5:27 PM Admission Comments: No comment available    Primary Diagnosis:  <principal problem not specified> Principal Problem: <principal problem not specified>       Patient Active Problem List    Diagnosis Date Noted  . Prerenal azotemia    . Benign essential HTN    . Seizures ([place])    . Anxiety state    . Acute lower UTI    . Right hemiparesis ([place]) 09/28/2016  . Gait disturbance, post-stroke 09/28/2016  . Small vessel disease, cerebrovascular 09/25/2016  . Transient alteration of awareness    . Gait disorder    . UTI due to Klebsiella species 09/23/2016  . Pre-diabetes 09/23/2016  . Spinal stenosis of lumbar region without neurogenic claudication    . Cerebrovascular accident (CVA) due to thrombosis of left middle cerebral artery ([place]) 09/22/2016  . Dysarthria 09/21/2016  . Unresponsiveness 09/20/2016  . Hypokalemia 09/20/2016  . Debility 09/20/2016  . Dehydration 09/20/2016  . Prolonged QT interval 09/20/2016  . Hypertension 09/20/2016  . Acute encephalopathy 09/20/2016      Expected Discharge Date: Expected Discharge Date: 10/12/16   Team Members Present: Physician leading conference: Dr. Alysia Penna Social Worker Present: Ovidio Kin, LCSW Nurse Present: Junius Creamer, RN PT Present: Georjean Mode, PT;Rodney Lajuana Matte, Rory Percy, PT OT Present: Clyda Greener, OT SLP Present: Windell Moulding,  SLP PPS Coordinator present : Ileana Ladd, PT       Current Status/Progress Goal Weekly Team Focus  Medical   Recurrent UTI, on Cipro  maintain med stability, reduce fall risk  d/c planning   Bowel/Bladder   Continent B/B. LBM 10/09/16. C/O urinary urgency and burning. Cipro started 1/16 evening.   Continent of B/B with min assist.  Monitor I/Os Q shift and PRN.   Swallow/Nutrition/ Hydration             ADL's   Supervision for UB B/D, Supervision- minA with extra time and AE for LB bathing and dressing. Pt able to transfer wtih close supervision to min A for transfers. Pt reports still needing A for throughness with toileting hygiene after a BM.Pt uses right hand at diminished to non dominant level with extra time and withotu cues. Pt continues to be a fall risk and can loose her balance backwards. Fam education has begun.  supervision overall  continued fam education, balance retraining NMR, therapeutic exercise and ADL retraining   Mobility   family ed completed; supervision /min transfers with RW, gait 50' min assistf on level tile, ramp and 12 steps; performance variable due to L knee pain  downgrading bed mobility goals to min assist; supervision basic and car transfers, downgraded gait to 50'  in controlled and home setting and min assist up/down ramp, 12 steps  activtity tolerance, neuro re-ed, L knee pain mgt, standing balance, transfers, locomotion   Communication  supervision   mod I   continue to address education and carryover of compensatory strategies    Safety/Cognition/ Behavioral Observations           Pain   Leg arthritis with voltaren gel QID and tolerable. Migraines/headaches every now and then with fiorcet Q6H PRN.  Maintain tolerable pain level of 3/10.  Monitor and assess pain Q shift and PRN.   Skin   Skin clean, dry and intact. Scattered bruisings to bilateral legs and arms.  Maintain and be free of any skin breakdown.  Assess and monitor skin integrity Q shift and  PRN.       *See Care Plan and progress notes for long and short-term goals.   Barriers to Discharge: still at min A   Possible Resolutions to Barriers:  complete family ed   Discharge Planning/Teaching Needs:  Husband and Daughter here to do family education MOnday and it went well. Plan for discharge Friday with home health therapies.      Team Discussion:  Family education on Monday it went well. Husband can assist with what is needed and daughter will stop by more frequently to assist. Arthritis limits her in therapies. L-knee brace helps with stability. On antibiotic for bladder issues. Medically stable for discharge Friday. Downgraded some goals to min assist level  Revisions to Treatment Plan:  Downgraded some OT & PT goals to min assist level-DC 1/17    Continued Need for Acute Rehabilitation Level of Care: The patient requires daily medical management by a physician with specialized training in physical medicine and rehabilitation for the following conditions: Daily direction of a multidisciplinary physical rehabilitation program to ensure safe treatment while eliciting the highest outcome that is of practical value to the patient.: Yes Daily medical management of patient stability for increased activity during participation in an intensive rehabilitation regime.: Yes Daily analysis of laboratory values and/or radiology reports with any subsequent need for medication adjustment of medical intervention for : Neurological problems   Clydean Posas, Gardiner Rhyme 10/10/2016, 12:24 PM      Elease Hashimoto, LCSW Social Worker Signed   Patient Care Conference Date of Service: 10/03/2016  1:13 PM      Hide copied text Hover for attribution information Inpatient RehabilitationTeam Conference and Plan of Care Update Date: 10/03/2016   Time: 10:40 AM      Patient Name: Savannah Travis      Medical Record Number: [id number]  Date of Birth: 24-Feb-1941 Sex: Female         Room/Bed:  4W07C/4W07C-01 Payor Info: Payor: MEDICARE / Plan: MEDICARE PART A AND B / Product Type: *No Product type* /     Admitting Diagnosis: CVA  Admit Date/Time:  09/25/2016  5:27 PM Admission Comments: No comment available    Primary Diagnosis:  <principal problem not specified> Principal Problem: <principal problem not specified>       Patient Active Problem List    Diagnosis Date Noted  . Right hemiparesis ([place]) 09/28/2016  . Gait disturbance, post-stroke 09/28/2016  . Small vessel disease, cerebrovascular 09/25/2016  . Transient alteration of awareness    . Gait disorder    . UTI due to Klebsiella species 09/23/2016  . Pre-diabetes 09/23/2016  . Spinal stenosis of lumbar region without neurogenic claudication    . Cerebrovascular accident (CVA) due to thrombosis of left middle cerebral artery ([place]) 09/22/2016  . Dysarthria 09/21/2016  . Unresponsiveness 09/20/2016  . Hypokalemia 09/20/2016  . Debility 09/20/2016  . Dehydration 09/20/2016  . Prolonged  QT interval 09/20/2016  . Hypertension 09/20/2016  . Acute encephalopathy 09/20/2016      Expected Discharge Date: Expected Discharge Date: 10/12/16   Team Members Present: Physician leading conference: Dr. Alysia Penna Social Worker Present: Ovidio Kin, LCSW Nurse Present: Heather Roberts, RN PT Present: Georjean Mode, Rory Percy, PT OT Present: Clyda Greener, OT SLP Present: Windell Moulding, SLP PPS Coordinator present : Daiva Nakayama, RN, CRRN       Current Status/Progress Goal Weekly Team Focus  Medical   Patient has spells may consist of headache as well as left-sided chest pain. She has pain evaluated both on inpatient and outpatient basis. Musculoskeletal.  Maintain medical stability during rehabilitation stay, coordinate with other providers  Further delineate nature  of patient's spells.   Bowel/Bladder   Incontinent at times,wears brief, some urgency with urine.  last bm was 1/6, pt refused sorbitol today due to  therapy  continent of b/b with min assist  monitor q shift give prns for bm every 2-3 days   Swallow/Nutrition/ Hydration             ADL's   supervision for UB bathing with min assist for UB dressing.  Min assist for LB bathing with mod assist for LB dressing sit to stand.  Min assist for toilet transfers using the RW with min assist for clothing management.  Increased apraxia noted with RUE.  Uses functionally at a diminished level for all self feeding with increased time.    supervision overall  selfcare retraining, balance retraining, neuromuscular re-education, therapeutic exercise, pt/family education   Mobility   gait limited by L knee pain; mod assist bed mobility, min assist stand pivot transfers with Rw, min assist gait x 25' recently due to knee pain; x 70' on 09/30/16  downgrading bed mobility goals to min assist; supervision basic and car transfers, gait x 200' in community setting and up/down 12 steps  activity tolerance, L knee pain management, neuro re-ed, standing balance, transfers and locomotion   Communication   mild dysarthria and word finding impairment, min assist    mod I   education and carryover of compensatory strategies for speech intelligibility    Safety/Cognition/ Behavioral Observations           Pain   c/o pain were mild r/t arthrits, helped by voltaren gel until 1/6, Saturday, when pt has "spell" she states she has been having for a couple months with "lightning bolt" of pain to forehead that sometimes travels to her chest.  Event also occured on 1/8 in evening  pain level below 3 with min assist  montior pain q shift and prn   Skin   no s/s of breakdown  no skin breakdown while at inpatient rehab          *See Care Plan and progress notes for long and short-term goals.   Barriers to Discharge: Continues require physical assistance for ADLs and mobility   Possible Resolutions to Barriers:  Continue rehabilitation program, see above   Discharge Planning/Teaching  Needs:  Home with husband who has medical issues of his own and can only provide supervision level. Daughter will be checking on couple times a week      Team Discussion:  Goals supervision level, incontinent of bladder was prior to admission. Neuro and Cardiologist consulted for looking into reason for her spells. EKG normal and TEE's done. AFO order for evaluation of left knee support. Pt fatigued after spell and needs to rest. Family education next  week.  Revisions to Treatment Plan:  DC Friday 1/19    Continued Need for Acute Rehabilitation Level of Care: The patient requires daily medical management by a physician with specialized training in physical medicine and rehabilitation for the following conditions: Daily direction of a multidisciplinary physical rehabilitation program to ensure safe treatment while eliciting the highest outcome that is of practical value to the patient.: Yes Daily medical management of patient stability for increased activity during participation in an intensive rehabilitation regime.: Yes Daily analysis of laboratory values and/or radiology reports with any subsequent need for medication adjustment of medical intervention for : Neurological problems;Other   Dafna Romo, Gardiner Rhyme 10/03/2016, 1:13 PM      Elease Hashimoto, LCSW Social Worker Signed   Patient Care Conference Date of Service: 09/26/2016  1:22 PM      Hide copied text Hover for attribution information Inpatient RehabilitationTeam Conference and Plan of Care Update Date: 09/26/2016   Time: 10:45 AM      Patient Name: Savannah Travis      Medical Record Number: [id number]  Date of Birth: 17-Nov-1940 Sex: Female         Room/Bed: 4W07C/4W07C-01 Payor Info: Payor: MEDICARE / Plan: MEDICARE PART A AND B / Product Type: *No Product type* /     Admitting Diagnosis: CVA  Admit Date/Time:  09/25/2016  5:27 PM Admission Comments: No comment available    Primary Diagnosis:  <principal problem not  specified> Principal Problem: <principal problem not specified>       Patient Active Problem List    Diagnosis Date Noted  . Small vessel disease, cerebrovascular 09/25/2016  . Transient alteration of awareness    . Gait disorder    . UTI due to Klebsiella species 09/23/2016  . Pre-diabetes 09/23/2016  . Spinal stenosis of lumbar region without neurogenic claudication    . Cerebrovascular accident (CVA) due to thrombosis of left middle cerebral artery ([place]) 09/22/2016  . Dysarthria 09/21/2016  . Unresponsiveness 09/20/2016  . Hypokalemia 09/20/2016  . Debility 09/20/2016  . Dehydration 09/20/2016  . Prolonged QT interval 09/20/2016  . Hypertension 09/20/2016  . Acute encephalopathy 09/20/2016      Expected Discharge Date:     Team Members Present: Physician leading conference: Dr. Alysia Penna Social Worker Present: Ovidio Kin, LCSW Nurse Present: Heather Roberts, RN PT Present: Georjean Mode, Rory Percy, PT OT Present: Clyda Greener, OT SLP Present: Windell Moulding, SLP PPS Coordinator present : Daiva Nakayama, RN, CRRN       Current Status/Progress Goal Weekly Team Focus  Medical   Patient initially with weakness of all 4 limbs. Now right-sided weakness. Dysarthria and aphasia are noted.  Improvement indication, maintain medical stability during rehabilitation stay  Initiate rehabilitation program   Bowel/Bladder     bladder urgency   urgency with urine-MD pursuing this     Swallow/Nutrition/ Hydration             ADL's     eval pending        Mobility     eval pending        Communication     Speech evaluating        Safety/Cognition/ Behavioral Observations           Pain        pain managed-monitor     Skin        na         *See Care Plan and progress notes for long and short-term goals.  Barriers to Discharge:  requires physical assistance for all self-care and mobility   Possible Resolutions to Barriers:  Continue rehabilitation program   Discharge  Planning/Teaching Needs:    Home with husband who can not assist with care, can provide supervision level. Daughter involved and assists twice weekly.     Team Discussion:  Was unable to conference due to not whole therapy team has seen and evaluated. Possible goals supervision level and est length of stay 2 weeks.  Revisions to Treatment Plan:  New eval    Continued Need for Acute Rehabilitation Level of Care: The patient requires daily medical management by a physician with specialized training in physical medicine and rehabilitation for the following conditions: Daily direction of a multidisciplinary physical rehabilitation program to ensure safe treatment while eliciting the highest outcome that is of practical value to the patient.: Yes Daily medical management of patient stability for increased activity during participation in an intensive rehabilitation regime.: Yes Daily analysis of laboratory values and/or radiology reports with any subsequent need for medication adjustment of medical intervention for : Neurological problems   Elease Hashimoto 09/26/2016, 1:22 PM       Patient ID: [name], female   DOB: December 01, 1940, 76 y.o.   MRN: [id number]

## 2016-10-10 NOTE — Plan of Care (Signed)
Problem: RH SAFETY Goal: RH STG ADHERE TO SAFETY PRECAUTIONS W/ASSISTANCE/DEVICE STG Adhere to Safety Precautions With mod Assistance and device  Outcome: Progressing Uses call light and safety measures appropriately.

## 2016-10-10 NOTE — Patient Care Conference (Signed)
Inpatient RehabilitationTeam Conference and Plan of Care Update Date: 10/10/2016   Time: 10:30 AM    Patient Name: Savannah Travis      Medical Record Number: [id number]  Date of Birth: 1941-04-15 Sex: Female         Room/Bed: 4W07C/4W07C-01 Payor Info: Payor: MEDICARE / Plan: MEDICARE PART A AND B / Product Type: *No Product type* /    Admitting Diagnosis: CVA  Admit Date/Time:  09/25/2016  5:27 PM Admission Comments: No comment available   Primary Diagnosis:  <principal problem not specified> Principal Problem: <principal problem not specified>  Patient Active Problem List   Diagnosis Date Noted  . Prerenal azotemia   . Benign essential HTN   . Seizures ([place])   . Anxiety state   . Acute lower UTI   . Right hemiparesis ([place]) 09/28/2016  . Gait disturbance, post-stroke 09/28/2016  . Small vessel disease, cerebrovascular 09/25/2016  . Transient alteration of awareness   . Gait disorder   . UTI due to Klebsiella species 09/23/2016  . Pre-diabetes 09/23/2016  . Spinal stenosis of lumbar region without neurogenic claudication   . Cerebrovascular accident (CVA) due to thrombosis of left middle cerebral artery ([place]) 09/22/2016  . Dysarthria 09/21/2016  . Unresponsiveness 09/20/2016  . Hypokalemia 09/20/2016  . Debility 09/20/2016  . Dehydration 09/20/2016  . Prolonged QT interval 09/20/2016  . Hypertension 09/20/2016  . Acute encephalopathy 09/20/2016    Expected Discharge Date: Expected Discharge Date: 10/12/16  Team Members Present: Physician leading conference: Dr. Alysia Penna Social Worker Present: Ovidio Kin, LCSW Nurse Present: Junius Creamer, RN PT Present: Georjean Mode, PT;Rodney Lajuana Matte, Rory Percy, PT OT Present: Clyda Greener, OT SLP Present: Windell Moulding, SLP PPS Coordinator present : Ileana Ladd, PT     Current Status/Progress Goal Weekly Team Focus  Medical   Recurrent UTI, on Cipro  maintain med stability, reduce fall risk  d/c planning    Bowel/Bladder   Continent B/B. LBM 10/09/16. C/O urinary urgency and burning. Cipro started 1/16 evening.   Continent of B/B with min assist.  Monitor I/Os Q shift and PRN.   Swallow/Nutrition/ Hydration             ADL's   Supervision for UB B/D, Supervision- minA with extra time and AE for LB bathing and dressing. Pt able to transfer wtih close supervision to min A for transfers. Pt reports still needing A for throughness with toileting hygiene after a BM.Pt uses right hand at diminished to non dominant level with extra time and withotu cues. Pt continues to be a fall risk and can loose her balance backwards. Fam education has begun.  supervision overall  continued fam education, balance retraining NMR, therapeutic exercise and ADL retraining   Mobility   family ed completed; supervision /min transfers with RW, gait 50' min assistf on level tile, ramp and 12 steps; performance variable due to L knee pain  downgrading bed mobility goals to min assist; supervision basic and car transfers, downgraded gait to 50'  in controlled and home setting and min assist up/down ramp, 12 steps  activtity tolerance, neuro re-ed, L knee pain mgt, standing balance, transfers, locomotion   Communication   supervision   mod I   continue to address education and carryover of compensatory strategies    Safety/Cognition/ Behavioral Observations            Pain   Leg arthritis with voltaren gel QID and tolerable. Migraines/headaches every now and then with fiorcet Q6H PRN.  Maintain tolerable pain level of 3/10.  Monitor and assess pain Q shift and PRN.   Skin   Skin clean, dry and intact. Scattered bruisings to bilateral legs and arms.  Maintain and be free of any skin breakdown.  Assess and monitor skin integrity Q shift and PRN.      *See Care Plan and progress notes for long and short-term goals.  Barriers to Discharge: still at min A    Possible Resolutions to Barriers:  complete family ed     Discharge Planning/Teaching Needs:  Husband and Daughter here to do family education MOnday and it went well. Plan for discharge Friday with home health therapies.      Team Discussion:  Family education on Monday it went well. Husband can assist with what is needed and daughter will stop by more frequently to assist. Arthritis limits her in therapies. L-knee brace helps with stability. On antibiotic for bladder issues. Medically stable for discharge Friday. Downgraded some goals to min assist level  Revisions to Treatment Plan:  Downgraded some OT & PT goals to min assist level-DC 1/17   Continued Need for Acute Rehabilitation Level of Care: The patient requires daily medical management by a physician with specialized training in physical medicine and rehabilitation for the following conditions: Daily direction of a multidisciplinary physical rehabilitation program to ensure safe treatment while eliciting the highest outcome that is of practical value to the patient.: Yes Daily medical management of patient stability for increased activity during participation in an intensive rehabilitation regime.: Yes Daily analysis of laboratory values and/or radiology reports with any subsequent need for medication adjustment of medical intervention for : Neurological problems  Elease Hashimoto 10/10/2016, 12:24 PM

## 2016-10-10 NOTE — Progress Notes (Signed)
Physical Therapy Note  Patient Details  Name: Savannah Travis MRN: [id number] Date of Birth: Jun 22, 1941 Today's Date: 10/10/2016  1100-1200, 60 min  Individual tx Pain: 8/10 L knee; received  pain meds during session.  Nustep for neuromuscular re-education via forced use x 5 minutes.  Pt c/o L knee pain despite limited excursion. Crepitus noted L knee. Pt unsure if she had had pain meds this AM. Seated 10 x 1 with 5 second hold, each: bil hip adduction against resistance, heel/toe raises. Pt continues to require cues for safe hand placement at times during sit>< stand. PT donned LKO.  Gait on level tile with RW to steps; up/down step- to method, leading up with RLE safely as this was her habit PTA, and L knee pain is more significant than RLE weakness. As she descended 6" high steps, pt "froze" due to RLE motor planning problems, requiring min assist/ cues to place R foot on floor.  PT educated pt on her "freezing" due to area impacted by CVA, and that it does not correlate with seizure activity. Pt left resting in w/c with all needs within reach, set up for lunch.  See function navigator for current status.  [name] 10/10/2016, 11:32 AM

## 2016-10-10 NOTE — Progress Notes (Signed)
Physical Therapy Session Note  Patient Details  Name: Savannah Travis MRN: [id number] Date of Birth: May 16, 1941  Today's Date: 10/10/2016 PT Individual Time: 1005-1031 PT Individual Time Calculation (min): 26 min    Short Term Goals: Week 1:  PT Short Term Goal 1 (Week 1): pt will move supine> sitting with min assist PT Short Term Goal 1 - Progress (Week 1): Not met PT Short Term Goal 2 (Week 1): pt will transfer bed>< w/c with mod assist PT Short Term Goal 2 - Progress (Week 1): Met PT Short Term Goal 3 (Week 1): pt will propel w/c x 100' with min assist for strengthening PT Short Term Goal 3 - Progress (Week 1): Not met PT Short Term Goal 4 (Week 1): pt will perform gait x 50' with mod assist PT Short Term Goal 4 - Progress (Week 1): Met Week 2:  PT Short Term Goal 1 (Week 2): STG =LTG due to ELOS  Skilled Therapeutic Interventions/Progress Updates:     Pt received sitting in WC and agreeable to PT  PT instructed patient in [place] level B  Including side stepping at rail with min assist, forward/backward gait x 10 ft with RW and min assist, sit<>stand x 5 with close superversion assist, figure 8 with RW with supervision progressing to min assist for improved RW control. Throughout dynamic balance training, PT provided min cues for improved stept height and step length to prevent LOB and reduce foot drag. Mild increase in step height noted following instruction.   Patient returned too room and left sitting in [facility] with call bell in reach and all needs met.      Therapy Documentation Precautions:  Precautions Precautions: Fall Restrictions Weight Bearing Restrictions: No Pain: Pain Assessment Pain Assessment: No/denies pain Pain Score: 0-No pain at rest  See Function Navigator for Current Functional Status.   Therapy/Group: Individual Therapy  Lorie Phenix 10/10/2016, 10:45 AM

## 2016-10-10 NOTE — Progress Notes (Signed)
Occupational Therapy Session Note  Patient Details  Name: Savannah Travis MRN: [id number] Date of Birth: 24-Jan-1941  Today's Date: 10/10/2016 OT Individual Time: [id number] OT Individual Time Calculation (min): 41 min     Short Term Goals: Week 2:  OT Short Term Goal 1 (Week 2): Continue working on supervision level goals for discharge.    Skilled Therapeutic Interventions/Progress Updates:    Pt completed toileting and dressing during session.  She was able to ambulate to the bathroom for toileting with use of the RW and min assist.  Decreased ability to advance the RLE with mod instructional cueing needed to stay inside the walker.  Dressing sit to stand at the sink for dressing.  She was able to use the reacher with supervision to remove gripper socks but needed total assist to don TEDs.  Min assist for donning pants and for bra with supervision for pullover shirt.  Max assist for shoes with elastic laces.  Pt left in wheelchair at end of session with call button in reach.    Therapy Documentation Precautions:  Precautions Precautions: Fall Restrictions Weight Bearing Restrictions: No  Pain: Pain Assessment Pain Assessment: 0-10 Pain Score: 7  Pain Type: Acute pain Pain Location: Knee Pain Orientation: Left Pain Descriptors / Indicators: Aching Pain Onset: With Activity Patients Stated Pain Goal: 3 Pain Intervention(s): Medication (See eMAR) ADL: See Function Navigator for Current Functional Status.   Therapy/Group: Individual Therapy  Aubrina Nieman OTR/L 10/10/2016, 12:14 PM

## 2016-10-10 NOTE — Progress Notes (Signed)
Social Work Patient ID: Savannah Travis, female   DOB: 05-23-1941, 76 y.o.   MRN: [id number]  Met with pt to discuss team conference, aware discharge Friday and feels ready to go home. She is aware if doesn't work at home they may need to pursue ALF for both she and husband. Want to try it at home first. Home Health arranged and wheelchair ordered.

## 2016-10-10 NOTE — Progress Notes (Signed)
Speech Language Pathology Daily Session Note  Patient Details  Name: Savannah Travis MRN: [id number] Date of Birth: [birth year]  Today's Date: 10/10/2016 SLP Individual Time: 0800-0900 SLP Individual Time Calculation (min): 60 min   Short Term Goals:Week 2: SLP Short Term Goal 1 (Week 2): Pt will utilize overarticulation and increased vocal intensity to achieve intelligibility in conversations in a minimally distracting environment with supervision.  SLP Short Term Goal 2 (Week 2): Pt will utilize compensatory word finding strategies in conversations with mod I.    Skilled Therapeutic Interventions: Pt was seen for skilled ST targeting communication goals.  Pt with prolonged coughing episode upon therapist's arrival which appeared to be related to reflux post breakfast meal.  Pt currently does not have orders for reflux medications, RN made aware.  Pt with decreased vocal intensity and intermittent wetness after coughing episode due to diminished vocal intensity for speech. Pt was able to achieve intelligibility with mod I when ordering her lunch over the phone.  She also demonstrated appropriate self monitoring and correction of verbal errors related to word finding with mod I during phone conversation.  During a structured verbal reasoning task, pt was also mod I for intelligibility and word finding.  Pt was left in wheelchair with call bell within reach.  Continue per current plan of care.       Function:  Eating Eating                 Cognition Comprehension Comprehension assist level: Follows basic conversation/direction with extra time/assistive device  Expression   Expression assist level: Expresses basic needs/ideas: With extra time/assistive device  Social Interaction Social Interaction assist level: Interacts appropriately 90% of the time - Needs monitoring or encouragement for participation or interaction.  Problem Solving Problem solving assist level: Solves basic 90% of the  time/requires cueing < 10% of the time  Memory Memory assist level: Recognizes or recalls 90% of the time/requires cueing < 10% of the time    Pain Pain Assessment Pain Assessment: No/denies pain Pain Score: 0-No pain  Therapy/Group: Individual Therapy  Sheralyn Pinegar, Selinda Orion 10/10/2016, 10:42 AM

## 2016-10-10 NOTE — Progress Notes (Signed)
Recreational Therapy Session Note  Patient Details  Name: Savannah Travis MRN: [id number] Date of Birth: 07/05/1941 Today's Date: 10/10/2016  Pain: no c/o  Pt referred by team for participation in TR group with emphasis on Community Reintegration(CR).  Group Goals:  After discussion: >Pt will state 3 types of barriers/obstacles that could be encountered during community pursuits with Mod I. > Pt will problem solve at least 2 modifications/solutions to above identified barriers/obstacles with min cues.  Pt will state at least 3 ways to conserve energy during community pursuits with Mod I.  Pt participated in community reintegration group for 45 minutes.  All of the above listed goals were met. [place] 10/10/2016, 9:28 AM

## 2016-10-11 ENCOUNTER — Inpatient Hospital Stay (HOSPITAL_COMMUNITY): Payer: Medicare Other | Admitting: Occupational Therapy

## 2016-10-11 ENCOUNTER — Inpatient Hospital Stay (HOSPITAL_COMMUNITY): Payer: Medicare Other

## 2016-10-11 ENCOUNTER — Inpatient Hospital Stay (HOSPITAL_COMMUNITY): Payer: Medicare Other | Admitting: Speech Pathology

## 2016-10-11 ENCOUNTER — Ambulatory Visit (HOSPITAL_COMMUNITY): Payer: Medicare Other | Admitting: Physical Therapy

## 2016-10-11 LAB — URINE CULTURE: Culture: >=100000 — AB

## 2016-10-11 NOTE — Progress Notes (Signed)
Occupational Therapy Discharge Summary  Patient Details  Name: Savannah Travis MRN: [id number] Date of Birth: March 17, 1941  Today's Date: 10/11/2016 OT Individual Time: 0902-1000 OT Individual Time Calculation (min): 58 min    Session Note:  Pt completed bathing and dressing during session.  Min assist for transfer to the walk-in shower with use of the RW for support.  Decreased ability to advance the RLE forward or backwards when attempting to align her body with the tub bench.  She was able to bathe with supervision, using LH sponge to assist with washing her back and her LEs.  Transferred back out to the wheelchair in front of the sink for dressing.  Min assist for donning bra and shoes with total assist for TEDs.  She was able to donn underpants and pants with close supervision and increased time.  Pt left in wheelchair for next SLP session with call button and phone in reach.      Patient has met 9 of 10 long term goals due to improved activity tolerance, improved balance, postural control, ability to compensate for deficits, functional use of  RIGHT upper and RIGHT lower extremity and improved coordination.  Patient to discharge at [facility] Assist level.  Patient's care partner is independent to provide the necessary physical and cognitive assistance at discharge.    Reasons goals not met: Pt needs supervision to min assist for dynamic standing balance.   Recommendation:  Patient will benefit from ongoing skilled OT services in home health setting to continue to advance functional skills in the area of BADL and Reduce care partner burden.  Pt still needs min assist to supervision for functional transfers and dynamic standing balance as it relates to selfcare tasks.  Feel she will benefit from continued OT via home health to further progress these deficits as well as increasing RUE functional use and overall endurance.    Equipment: No equipment provided  Reasons for discharge: treatment goals  met and discharge from hospital  Patient/family agrees with progress made and goals achieved: Yes  OT Discharge Precautions/Restrictions  Precautions Precautions: Fall Restrictions Weight Bearing Restrictions: No  Pain Pain Assessment Pain Assessment: No/denies pain ADL  See Function Section of chart for details  Vision/Perception  Vision- History Baseline Vision/History: Wears glasses Wears Glasses: At all times Patient Visual Report: No change from baseline Vision- Assessment Vision Assessment?: No apparent visual deficits;Yes Eye Alignment: Within Functional Limits Ocular Range of Motion: Within Functional Limits Alignment/Gaze Preference: Within Defined Limits Tracking/Visual Pursuits: Able to track stimulus in all quads without difficulty Convergence: Within functional limits  Cognition Overall Cognitive Status: History of cognitive impairments - at baseline Arousal/Alertness: Awake/alert Orientation Level: Oriented X4 Attention: Selective Focused Attention: Appears intact Sustained Attention: Appears intact Selective Attention: Appears intact Memory: Impaired Memory Impairment: Decreased recall of new information Awareness: Appears intact Problem Solving: Appears intact Safety/Judgment: Impaired Comments: Mrs. Zara continues to need cueing for use of the RW and for sit to stand. Sensation Sensation Light Touch: Impaired Detail Light Touch Impaired Details: Impaired RUE;Impaired LUE Stereognosis: Not tested Hot/Cold: Not tested Proprioception: Appears Intact Additional Comments: Pt reports numbness in both hands from previous wrist fxs PTA. Coordination Gross Motor Movements are Fluid and Coordinated: No Fine Motor Movements are Fluid and Coordinated: No Coordination and Movement Description: Pt uses the RUE at a diminished level with increased time for selfcare tasks.  She can pull her pants up over her hips on the right side with multiple attempts using  the right hand.  Motor  Motor Motor: Hemiplegia;Abnormal postural alignment and control Motor - Discharge Observations: Decreased ability to consistently advance the RLE with mobility.  Increased time for RUE use secondary to motor planning as well.  Mobility  Bed Mobility Bed Mobility: Rolling Right;Right Sidelying to Sit;Supine to Sit Rolling Right: 5: Supervision Right Sidelying to Sit: 5: Supervision;HOB flat Transfers Transfers: Sit to Stand;Stand to Sit Sit to Stand: 5: Supervision;With upper extremity assist;From chair/3-in-1 Stand to Sit: 5: Supervision;With upper extremity assist;To chair/3-in-1 Stand to Sit Details: Pt still needs cueing 50% of the time for hand placement with sit to stand transitions.  Trunk/Postural Assessment  Cervical Assessment Cervical Assessment: Within Functional Limits Thoracic Assessment Thoracic Assessment: Exceptions to [facility] (rounding of shoulders and upper trunk slightly) Lumbar Assessment Lumbar Assessment: Within Functional Limits  Balance Balance Balance Assessed: Yes Static Sitting Balance Static Sitting - Level of Assistance: 6: Modified independent (Device/Increase time) Dynamic Sitting Balance Dynamic Sitting - Balance Support: During functional activity Dynamic Sitting - Level of Assistance: 6: Modified independent (Device/Increase time) Static Standing Balance Static Standing - Balance Support: During functional activity Static Standing - Level of Assistance: 5: Stand by assistance Dynamic Standing Balance Dynamic Standing - Balance Support: During functional activity Dynamic Standing - Level of Assistance: 4: Min assist Extremity/Trunk Assessment RUE Assessment RUE Assessment: Exceptions to [facility] (AROM shoulder flexion approximately 0-130 degrees with strength at 3-/5 at the shoulder.  All other joints AROM WFLS with strength at 3+/5 throughout.  Decreased coordination both gross and FM secondary to motor planning deficits but uses  functionally.) LUE Assessment LUE Assessment: Exceptions to [facility] (Strength 3+/5 overall at the elbow and shoulder.  Grip strength 3/5 secondary to recent wrist fracture. )   See Function Navigator for Current Functional Status.  Erisha Paugh OTR/L 10/11/2016, 12:40 PM

## 2016-10-11 NOTE — Progress Notes (Signed)
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 Call to RN Marjorie Smolder to ensure initiation of strict CONTACT precautions due to highly resistant organism. Luther Hearing RN BSN Infection Prevention

## 2016-10-11 NOTE — Progress Notes (Signed)
Physical Therapy Discharge Summary  Patient Details  Name: Savannah Travis MRN: [id number] Date of Birth: Sep 04, 1941  Today's Date: 10/11/2016 PT Individual Time: 1300-1430 PT Individual Time Calculation (min): 90 min   Patient has met 10 of 11 long term goals due to improved activity tolerance, improved balance, improved postural control, increased strength, ability to compensate for deficits, functional use of  right upper extremity, right lower extremity and left lower extremity and improved awareness.  Patient to discharge at an ambulatory level Supervision on level floor; min asssit on ramp or stairs.  Patient's care partner is independent to provide the necessary physical and cognitive assistance at discharge.  Reasons goals not met: pt has poor balance reactions when stepping backwards, and had mild LOB requiring min assist when moving stand> sit in armchair.   Recommendation:  Patient will benefit from ongoing skilled PT services in home health setting to continue to advance safe functional mobility, address ongoing impairments in balance, motor, pain L knee, memory, and minimize fall risk.  Equipment: ultra hemi w/c with basic cushion, L KO for arthritis; she already owned [employer]  Reasons for discharge: treatment goals met and discharge from hospital  Patient/family agrees with progress made and goals achieved: Yes  PT Discharge  Function tools assessed.  PT donned LKO for gait and steps. Pt with improved stepping RLe , no "freezing" due to motor planning problems, but 1 LOB when backing up to sit.  VCs for hand placement on RW during sit>< stand. On steps, pt switched LE leading when ascending, self selected, as her L knee fatigued/became painful due to OA.  Pt left resting in w/c with all needs within reach.  Precautions/Restrictions Precautions Precautions: Fall Precaution Comments: L knee OA; wears off leading KO during gait only Restrictions Weight Bearing  Restrictions: No Vital Signs Therapy Vitals Temp: 97.8 F (36.6 C) Temp Source: Oral Pulse Rate: 64 Resp: 18 BP: 128/64 Patient Position (if appropriate): Standing Oxygen Therapy SpO2: 98 % O2 Device: Not Delivered Pain Pain Assessment Pain Assessment: No/denies pain Vision/Perception     Cognition Overall Cognitive Status: History of cognitive impairments - at baseline Arousal/Alertness: Awake/alert Orientation Level: Oriented X4 Attention: Selective Focused Attention: Appears intact Sustained Attention: Appears intact Selective Attention: Appears intact Memory: Impaired Memory Impairment: Decreased recall of new information Awareness: Appears intact Problem Solving: Appears intact Safety/Judgment: Impaired Comments: Mrs. Wyss continues to need cueing for use of the RW and for sit to stand. Sensation Sensation Light Touch: Appears Intact Proprioception: Appears Intact Coordination Heel Shin Test: limited speed, excursion, bil, R more affected Motor  Motor Motor: Hemiplegia;Abnormal postural alignment and control Motor - Discharge Observations: decreased motor planning RLE functionally, although improved recently  Mobility Bed Mobility Bed Mobility: Rolling Right;Supine to Sit;Right Sidelying to Sit Rolling Right: 5: Supervision Rolling Right Details: Verbal cues for safe use of DME/AE Rolling Left: 5: Supervision Right Sidelying to Sit: 5: Supervision;HOB flat Supine to Sit: HOB flat;5: Supervision Sit to Supine: 5: Supervision Transfers Transfers: Yes Sit to Stand: 5: Supervision;With upper extremity assist Stand to Sit: 5: Supervision;With upper extremity assist Stand to Sit Details (indicate cue type and reason): Verbal cues for safe use of DME/AE Stand Pivot Transfers: 5: Supervision Stand Pivot Transfer Details: Verbal cues for safe use of DME/AE Locomotion  Ambulation Ambulation: Yes Ambulation/Gait Assistance: 5: Supervision Ambulation Distance  (Feet): 70 Feet Assistive device: Rolling walker Ambulation/Gait Assistance Details: Verbal cues for safe use of DME/AE;Verbal cues for technique Ambulation/Gait Assistance Details:  L knee brace Gait Gait: Yes Gait Pattern: Impaired Gait Pattern: Decreased hip/knee flexion - right;Decreased hip/knee flexion - left;Right foot flat;Narrow base of support;Decreased trunk rotation;Decreased dorsiflexion - right Gait velocity: decreased Stairs / Additional Locomotion Stairs: Yes Stairs Assistance: 4: Min guard Stairs Assistance Details: Verbal cues for safe use of DME/AE;Verbal cues for technique Stairs Assistance Details (indicate cue type and reason): cue rarely to get R foot fully onto tread Stair Management Technique: Two rails;Alternating pattern (self selects which LE to lead with dependeing upon L knee pain) Number of Stairs: 12 Height of Stairs: 3 (and 6) Ramp: 4: Min assist Curb: Not tested (comment) Architect: Yes Wheelchair Assistance: 5: Careers information officer: Both lower extermities;Both upper extremities Wheelchair Parts Management: Needs assistance Distance: 150  Trunk/Postural Assessment  Cervical Assessment Cervical Assessment: Within Functional Limits Thoracic Assessment Thoracic Assessment: Exceptions to [facility] (rounding of shoulders and upper trunk slightly) Thoracic AROM Overall Thoracic AROM Comments: decreased R tunk rotation Lumbar Assessment Lumbar Assessment: Within Functional Limits Postural Control Postural Control: Deficits on evaluation Righting Reactions: very delayed bil hip strategy; delayed R ankle strategy  Balance Balance Balance Assessed: Yes Static Sitting Balance Static Sitting - Level of Assistance: 6: Modified independent (Device/Increase time) Dynamic Sitting Balance Dynamic Sitting - Balance Support: During functional activity Dynamic Sitting - Level of Assistance: 6: Modified independent  (Device/Increase time) Static Standing Balance Static Standing - Balance Support: During functional activity Static Standing - Level of Assistance: 5: Stand by assistance Dynamic Standing Balance Dynamic Standing - Balance Support: During functional activity Dynamic Standing - Level of Assistance: 5: Stand by assistance (during gait with AD) Extremity Assessment      RLE Assessment RLE Assessment: Exceptions to [facility] RLE Strength RLE Overall Strength Comments: grossly in sittihng: hip flexion 3-/5, but able to take resistance; knee ext 4+/5, ankle DF 4+/5 LLE Assessment LLE Assessment: Within Functional Limits   See Function Navigator for Current Functional Status.  Cotey Rakes 10/11/2016, 4:47 PM

## 2016-10-11 NOTE — Plan of Care (Signed)
Problem: RH Balance Goal: LTG Patient will maintain dynamic standing with ADLs (OT) LTG:  Patient will maintain dynamic standing balance with assist during activities of daily living (OT)   Outcome: Not Met (add Reason) Pt needs supervision to min assist.

## 2016-10-11 NOTE — Progress Notes (Signed)
Subjective/Complaints: Patient is without further spells. She continues have urine frequency, has seen a neurologist as an outpatient in [place] about a year ago. Symptoms have been going on chronically. Has undergone cystoscopy.  ROS: Denies nausea, vomiting, diarrhea, shortness of breath or chest pain  Objective: Vital Signs: Blood pressure 132/60, pulse 68, temperature 97.9 F (36.6 C), temperature source Oral, resp. rate 16, height 4\' 8"  (1.422 m), weight 90.3 kg (199 lb), SpO2 98 %. No results found. Results for orders placed or performed during the hospital encounter of 09/25/16 (from the past 72 hour(s))  Urinalysis, Routine w reflex microscopic     Status: Abnormal   Collection Time: 10/08/16  3:24 PM  Result Value Ref Range   Color, Urine YELLOW YELLOW   APPearance HAZY (A) CLEAR   Specific Gravity, Urine 1.016 1.005 - 1.030   pH 5.0 5.0 - 8.0   Glucose, UA NEGATIVE NEGATIVE mg/dL   Hgb urine dipstick NEGATIVE NEGATIVE   Bilirubin Urine NEGATIVE NEGATIVE   Ketones, ur NEGATIVE NEGATIVE mg/dL   Protein, ur NEGATIVE NEGATIVE mg/dL   Nitrite POSITIVE (A) NEGATIVE   Leukocytes, UA LARGE (A) NEGATIVE   RBC / HPF 0-5 0 - 5 RBC/hpf   WBC, UA TOO NUMEROUS TO COUNT 0 - 5 WBC/hpf   Bacteria, UA RARE (A) NONE SEEN   Squamous Epithelial / LPF NONE SEEN NONE SEEN   WBC Clumps PRESENT    Mucous PRESENT   Culture, Urine     Status: Abnormal   Collection Time: 10/08/16  3:24 PM  Result Value Ref Range   Specimen Description URINE, CATHETERIZED    Special Requests NONE    Culture (A)     >=100,000 COLONIES/mL ENTEROBACTER AEROGENES CARBAPENEMASE RESISTANT ENTEROBACTERIACAE INFECTION CONTROL NOTIFIED M [facility] 10/11/16 @ 0911 M VESTAL RESULT CALLED TO, READ BACK BY AND VERIFIED WITH: A LEONAR 10/11/16 @ 0915 M VESTAL    Report Status 10/11/2016 FINAL    Organism ID, Bacteria ENTEROBACTER AEROGENES (A)       Susceptibility   Enterobacter aerogenes - MIC*    CEFAZOLIN >=64  RESISTANT Resistant     CEFTRIAXONE >=64 RESISTANT Resistant     CIPROFLOXACIN <=0.25 SENSITIVE Sensitive     GENTAMICIN <=1 SENSITIVE Sensitive     IMIPENEM 1 RESISTANT Resistant     NITROFURANTOIN 64 INTERMEDIATE Intermediate     TRIMETH/SULFA <=20 SENSITIVE Sensitive     PIP/TAZO >=128 RESISTANT Resistant     * >=100,000 COLONIES/mL ENTEROBACTER AEROGENES     HEENT: Normocephalic, atraumatic.  Cardio: RRR. No JVD. Resp: CTA B/L and Unlabored.  GI: BS positive and nondistended Skin:   Intact. Warm and dry Neuro: Alert/Oriented,  Cranial Nerve II-XII grossly normal Motor: 4+/5 right deltoid, bicep, triceps, grip 4+/5 right hip flexor, knee extensor, ankle dorsiflexor. 4/5 left deltoid, biceps, triceps, grip 4+/5 left hip flexor, knee extensor, ankle dorsi flexor No increase in tone noted Dysarthria Musc/Skel:  No edema, no tenderness Gen. no acute distress. Vital signs reviewed  Assessment/Plan: 1. Functional deficits secondary to left corona radiata infarct, right hemiparesis, aphasia, dysarthria which require 3+ hours per day of interdisciplinary therapy in a comprehensive inpatient rehab setting. Physiatrist is providing close team supervision and 24 hour management of active medical problems listed below. Physiatrist and rehab team continue to assess barriers to discharge/monitor patient progress toward functional and medical goals. FIM: Function - Bathing Position: Shower Body parts bathed by patient: Right arm, Left arm, Chest, Abdomen, Front perineal area, Right upper  leg, Left upper leg, Buttocks, Right lower leg, Left lower leg, Back Body parts bathed by helper: Back Bathing not applicable: Right lower leg, Left lower leg Assist Level: Supervision or verbal cues, Set up Set up : To obtain items, To adjust water temperature  Function- Upper Body Dressing/Undressing What is the patient wearing?: Bra, Pull over shirt/dress Bra - Perfomed by patient: Thread/unthread  right bra strap, Thread/unthread left bra strap Bra - Perfomed by helper: Hook/unhook bra (pull down sports bra) Pull over shirt/dress - Perfomed by patient: Thread/unthread right sleeve, Thread/unthread left sleeve, Put head through opening, Pull shirt over trunk Assist Level: Supervision or verbal cues, Set up Set up : To obtain clothing/put away Function - Lower Body Dressing/Undressing What is the patient wearing?: Shoes, Maryln Manuel, Pants Position: Wheelchair/chair at [employer] - Performed by patient: Thread/unthread right underwear leg, Thread/unthread left underwear leg, Pull underwear up/down Underwear - Performed by helper: Pull underwear up/down Pants- Performed by patient: Thread/unthread right pants leg, Thread/unthread left pants leg, Pull pants up/down Pants- Performed by helper: Pull pants up/down Non-skid slipper socks- Performed by helper: Don/doff right sock, Don/doff left sock Shoes - Performed by patient: Don/doff right shoe, Don/doff left shoe, Fasten right, Fasten left Shoes - Performed by helper: Don/doff right shoe, Don/doff left shoe TED Hose - Performed by helper: Don/doff right TED hose, Don/doff left TED hose Assist for footwear: Partial/moderate assist Assist for lower body dressing: Touching or steadying assistance (Pt > 75%)  Function - Toileting Toileting steps completed by patient: Adjust clothing prior to toileting, Performs perineal hygiene, Adjust clothing after toileting Toileting steps completed by helper: Performs perineal hygiene, Adjust clothing after toileting Toileting Assistive Devices: Grab bar or rail Assist level: Supervision or verbal cues  Function - Air cabin crew transfer assistive device: Elevated toilet seat/BSC over toilet, Grab bar Assist level to toilet: Supervision or verbal cues Assist level from toilet: Supervision or verbal cues Assist level to bedside commode (at bedside): Touching or steadying assistance (Pt >  75%) Assist level from bedside commode (at bedside): Touching or steadying assistance (Pt > 75%)  Function - Chair/bed transfer Chair/bed transfer activity did not occur: N/A Chair/bed transfer method: Ambulatory Chair/bed transfer assist level: Supervision or verbal cues Chair/bed transfer assistive device: Armrests, Walker Chair/bed transfer details: Verbal cues for safe use of DME/AE, Verbal cues for technique  Function - Locomotion: Wheelchair Will patient use wheelchair at discharge?: Yes Type: Manual Max wheelchair distance: 100 ft Assist Level: Supervision or verbal cues Assist Level: Supervision or verbal cues Function - Locomotion: Ambulation Assistive device: Walker-rolling Max distance: 10 Assist level: Supervision or verbal cues Assist level: Supervision or verbal cues Walk 50 feet with 2 turns activity did not occur: Safety/medical concerns (L knee painful, R knee weak) Assist level: Touching or steadying assistance (Pt > 75%) Walk 10 feet on uneven surfaces activity did not occur: Safety/medical concerns (L knee painful, R knee weak) Assist level: Moderate assist (Pt 50 - 74%)  Function - Comprehension Comprehension: Auditory Comprehension assist level: Follows basic conversation/direction with extra time/assistive device  Function - Expression Expression: Verbal Expression assist level: Expresses basic needs/ideas: With extra time/assistive device  Function - Social Interaction Social Interaction assist level: Interacts appropriately 90% of the time - Needs monitoring or encouragement for participation or interaction.  Function - Problem Solving Problem solving assist level: Solves basic 90% of the time/requires cueing < 10% of the time  Function - Memory Memory assist level: Recognizes or recalls 50 - 74%  of the time/requires cueing 25 - 49% of the time Patient normally able to recall (first 3 days only): Current season, Location of own room, Staff names and  faces, That he or she is in a hospital   Medical Problem List and Plan: 1.Decreased functional mobility with lower extremity weakness/altered mental status/question syncopesecondary to left corona radiata infarct Cont CIR PT, OT, speech therapy with focus on family training prior to discharge 2. DVT Prophylaxis/Anticoagulation: SCDs. Monitor for any signs of DVT 3. Pain Management/headaches: Topamax 25 mg twice a day, chest pain, musculoskeletal chest wall, sports cream, has had cardiac workup for chest pain which was negative. Repeat EKG times 2 reviewed, negative. No pain during physical therapy. Left knee pain, advanced osteoarthritis, medial compartment, as well as patellofemoral, moderate at the lateral compartment.  -Cont Voltaren gel  - knee orthosis 4. Mood: Ludiomil 50 mg daily at bedtime.Klonopin 1 mg daily at bedtime---pt would like this scheduled 5. Neuropsych: This patient iscapable of making decisions on herown behalf. 6. Skin/Wound Care: Routine skin checks 7. 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 Continue Trileptal 300 mg twice a day           -per neuro no clear-cut seizure activity, possible complicated migraine, Topamax discontinued, Depakote added 9.Hypertension. Norvasc 5 mg daily. Monitor with increased mobility, blood pressure is controlled  Blood pressure 132/60, pulse 68, temperature 97.9 F (36.6 C), temperature source Oral, resp. rate 16, height 4\' 8"  (1.422 m), weight 90.3 kg (199 lb), SpO2 98 %.  Vitals:   10/10/16 1348 10/11/16 0517  BP: (!) 141/70 132/60  Pulse: 83 68  Resp: 18 16  Temp: 97.7 F (36.5 C) 97.9 F (36.6 C)   10.Klebsiella UTI. Ceftin completed  May have spastic neurogenic bladder in addition to UTI. Follow-up with urology as an outpatient, preferably the same physician that saw her in [place] and performed cystoscopy.   UA+, Ucx Enterobacter with  multiple drug resistance. It is sensitive to Cipro, continue 7 day course 11.Hyperlipidemia. Lipitor 12.History of gout. Allopurinol 300 mg daily. Monitor for any gout flareups. No pain at present    13.  Pre renal azotemia, oral intake, improving    Labs ordered for tomorrow 14.  HypoK: Resolved on KCL 34meq dqaily  On potassium, Normal potassium level on 10/08/2016  Labs ordered for Monday    LOS (Days) 16 A FACE TO FACE EVALUATION WAS PERFORMED  Equan Cogbill E 10/11/2016, 10:31 AM

## 2016-10-11 NOTE — Discharge Summary (Signed)
Discharge summary job # [id number]

## 2016-10-11 NOTE — Progress Notes (Signed)
Speech Language Pathology Discharge Summary  Patient Details  Name: Savannah Travis MRN: [id number] Date of Birth: 24-Feb-1941  Today's Date: 10/11/2016 SLP Individual Time: 1002-1100 SLP Individual Time Calculation (min): 58 min   Skilled Therapeutic Interventions:  Pt was seen for skilled ST targeting communication goals.  SLP facilitated the session with a novel card game to address use of compensatory word finding and intelligibility strategies while multitasking.  Pt was mod I for intelligibility and word finding during task in a moderately distracting environment.  She demonstrated increased verbal errors when discussing new medical issues with MD during AM rounds; however, pt was able to monitor and correct errors with mod I.  Pt was returned to room and left in wheelchair with call bell within reach.  Pt is ready for discharge tomorrow.      Patient has met 2 of 2 long term goals.  Patient to discharge at overall Modified Independent level.  Reasons goals not met:     Clinical Impression/Discharge Summary:   Pt has made functional gains while inpatient and is discharging having met 2 out of 2 long term goals.  Pt is utilizing compensatory intelligibility and word finding strategies with mod I at the conversational level.  Pt has mild memory impairment which is baseline per report.  Pt and family education is complete at this time.  Pt is discharging home with 24/7 supervision from family.  Pt may benefit from brief ST follow up at next level of care to continue to address speech and language function.    Care Partner:  Caregiver Able to Provide Assistance: Yes  Type of Caregiver Assistance: Physical;Cognitive  Recommendation:  24 hour supervision/assistance;Home Health SLP  Rationale for SLP Follow Up: Maximize functional communication   Equipment: none recomended by SLP   Reasons for discharge: Discharged from hospital   Patient/Family Agrees with Progress Made and Goals Achieved:  Yes   Function:  Eating Eating              Cognition Comprehension Comprehension assist level: Follows basic conversation/direction with extra time/assistive device  Expression   Expression assist level: Expresses complex ideas: With extra time/assistive device  Social Interaction Social Interaction assist level: Interacts appropriately 90% of the time - Needs monitoring or encouragement for participation or interaction.  Problem Solving Problem solving assist level: Solves basic problems with no assist  Memory Memory assist level: Recognizes or recalls 90% of the time/requires cueing < 10% of the time   Windell Moulding L 10/11/2016, 11:58 AM

## 2016-10-12 MED ORDER — CLONAZEPAM 1 MG PO TABS: 1.0000 mg | ORAL_TABLET | Freq: Every evening | ORAL | 0 refills | Status: AC | PRN

## 2016-10-12 MED ORDER — PANTOPRAZOLE SODIUM 40 MG PO TBEC: 40.0000 mg | DELAYED_RELEASE_TABLET | Freq: Every day | ORAL | 0 refills | Status: DC

## 2016-10-12 MED ORDER — AMLODIPINE BESYLATE 5 MG PO TABS: 5.0000 mg | ORAL_TABLET | ORAL | 1 refills | Status: DC

## 2016-10-12 MED ORDER — DIVALPROEX SODIUM ER 500 MG PO TB24: 500.0000 mg | ORAL_TABLET | Freq: Every day | ORAL | 1 refills | Status: DC

## 2016-10-12 MED ORDER — MAPROTILINE HCL 50 MG PO TABS: 50.0000 mg | ORAL_TABLET | Freq: Every day | ORAL | 1 refills | Status: AC

## 2016-10-12 MED ORDER — DICLOFENAC SODIUM 1 % TD GEL: 2.0000 g | Freq: Four times a day (QID) | TRANSDERMAL | 1 refills | Status: AC

## 2016-10-12 MED ORDER — ALLOPURINOL 300 MG PO TABS: 300.0000 mg | ORAL_TABLET | ORAL | 1 refills | Status: AC

## 2016-10-12 MED ORDER — CLOPIDOGREL BISULFATE 75 MG PO TABS: 75.0000 mg | ORAL_TABLET | Freq: Every day | ORAL | 0 refills | Status: AC

## 2016-10-12 MED ORDER — BUTALBITAL-APAP-CAFFEINE 50-325-40 MG PO TABS: 1.0000 | ORAL_TABLET | Freq: Four times a day (QID) | ORAL | 0 refills | Status: DC | PRN

## 2016-10-12 MED ORDER — ATORVASTATIN CALCIUM 40 MG PO TABS: 40.0000 mg | ORAL_TABLET | Freq: Every day | ORAL | 1 refills | Status: AC

## 2016-10-12 MED ORDER — CIPROFLOXACIN HCL 250 MG PO TABS: 250.0000 mg | ORAL_TABLET | Freq: Two times a day (BID) | ORAL | 0 refills | Status: DC

## 2016-10-12 NOTE — Discharge Instructions (Signed)
Inpatient Rehab Discharge Instructions  Savannah Travis Discharge date and time: No discharge date for patient encounter.   Activities/Precautions/ Functional Status: Activity: activity as tolerated Diet: heart healthy Wound Care: none needed Functional status:  ___ No restrictions     ___ Walk up steps independently ___ 24/7 supervision/assistance   ___ Walk up steps with assistance ___ Intermittent supervision/assistance  ___ Bathe/dress independently ___ Walk with walker     _x STROKE/TIA DISCHARGE INSTRUCTIONS SMOKING Cigarette smoking nearly doubles your risk of having a stroke & is the single most alterable risk factor  If you smoke or have smoked in the last 12 months, you are advised to quit smoking for your health.  Most of the excess cardiovascular risk related to smoking disappears within a year of stopping.  Ask you doctor about anti-smoking medications  [place] Quit Line: 1-800-QUIT NOW  Free Smoking Cessation Classes [phone]  CHOLESTEROL Know your levels; limit fat & cholesterol in your diet  Lipid Panel     Component Value Date/Time   CHOL 156 09/22/2016 0433   TRIG 111 09/22/2016 0433   HDL 47 09/22/2016 0433   CHOLHDL 3.3 09/22/2016 0433   VLDL 22 09/22/2016 0433   LDLCALC 87 09/22/2016 0433      Many patients benefit from treatment even if their cholesterol is at goal.  Goal: Total Cholesterol (CHOL) less than 160  Goal:  Triglycerides (TRIG) less than 150  Goal:  HDL greater than 40  Goal:  LDL (LDLCALC) less than 100   BLOOD PRESSURE American Stroke Association blood pressure target is less that 120/80 mm/Hg  Your discharge blood pressure is:  BP: 133/64  Monitor your blood pressure  Limit your salt and alcohol intake  Many individuals will require more than one medication for high blood pressure  DIABETES (A1c is a blood sugar average for last 3 months) Goal HGBA1c is under 7% (HBGA1c is blood sugar average for last 3 months)  Diabetes:      Lab Results  Component Value Date   HGBA1C 5.6 09/22/2016     Your HGBA1c can be lowered with medications, healthy diet, and exercise.  Check your blood sugar as directed by your physician  Call your physician if you experience unexplained or low blood sugars.  PHYSICAL ACTIVITY/REHABILITATION Goal is 30 minutes at least 4 days per week  Activity: Increase activity slowly, Therapies: Physical Therapy: Home Health Return to work:   Activity decreases your risk of heart attack and stroke and makes your heart stronger.  It helps control your weight and blood pressure; helps you relax and can improve your mood.  Participate in a regular exercise program.  Talk with your doctor about the best form of exercise for you (dancing, walking, swimming, cycling).  DIET/WEIGHT Goal is to maintain a healthy weight  Your discharge diet is: Diet heart healthy/carb modified Room service appropriate? Yes; Fluid consistency: Thin  liquids Your height is:  Height: 4\' 8"  (142.2 cm) Your current weight is: Weight: 91.9 kg (202 lb 8 oz) Your Body Mass Index (BMI) is:  BMI (Calculated): 44.9  Following the type of diet specifically designed for you will help prevent another stroke.  Your goal weight range is:    Your goal Body Mass Index (BMI) is 19-24.  Healthy food habits can help reduce 3 risk factors for stroke:  High cholesterol, hypertension, and excess weight.  RESOURCES Stroke/Support Group:  Call [phone]   STROKE EDUCATION PROVIDED/REVIEWED AND GIVEN TO PATIENT Stroke warning signs  and symptoms How to activate emergency medical system (call 911). Medications prescribed at discharge. Need for follow-up after discharge. Personal risk factors for stroke. Pneumonia vaccine given:  Flu vaccine given:  My questions have been answered, the writing is legible, and I understand these instructions.  I will adhere to these goals & educational materials that have been provided to me after my  discharge from the hospital.   __ Bathe/dress with assistance ___ Walk Independently    ___ Shower independently ___ Walk with assistance    ___ Shower with assistance ___ No alcohol     ___ Return to work/school ________  Special Instructions:  Continue universal precautions for urinary tract infection and washing hands  COMMUNITY REFERRALS UPON DISCHARGE:    Home Health:   PT, OT, RN    [place]   [id number]  Date of last service:10/12/2016  Medical Equipment/Items [place]   GENERAL COMMUNITY RESOURCES FOR PATIENT/FAMILY: Support Groups:[facility] SUPPORT GROUP EVERY SECOND Thursday @ 3:00-4:00 PM ON THE REHAB UNIT QUESTIONS CONTACT CAITLYN [id number]  My questions have been answered and I understand these instructions. I will adhere to these goals and the provided educational materials after my discharge from the hospital.  Patient/Caregiver Signature _______________________________ Date __________  Clinician Signature _______________________________________ Date __________  Please bring this form and your medication list with you to all your follow-up doctor's appointments.

## 2016-10-12 NOTE — Progress Notes (Signed)
Social Work Discharge Note  The overall goal for the admission was met for:   Discharge location: [facility] [place] [facility]  Length of Stay: Yes-17 DAYS  Discharge activity level: Yes-SUPERVISION-MIN ASSIST LEVEL  Home/community participation: Yes  Services provided included: MD, RD, PT, OT, SLP, RN, CM, TR, Pharmacy and SW  Financial Services: Medicare and Private Insurance: [place]  Follow-up services arranged: Home Health: [facility]-PT,OT,RN, DME: [place] and Patient/Family request agency HH: PREF HUSBAND HAS USED BEFORE, DME: NO PREF  Comments (or additional information):HUSNBAND AND DAUGHTER WHERE IN FOR FAMILY EDUCATION AND IT WENT WELL. BOTH FEEL COMFORTABLE WITH PT'S CARE AND READY TO GO HOME.  Patient/Family verbalized understanding of follow-up arrangements: Yes  Individual responsible for coordination of the follow-up plan: SELF & CINDY-DAUGHTER  Confirmed correct DME delivered: Elease Hashimoto 10/12/2016    Elease Hashimoto

## 2016-10-12 NOTE — Progress Notes (Signed)
Patient discharged to home, accompanied by her daughter.

## 2016-10-12 NOTE — Discharge Summary (Signed)
Savannah Travis, Savannah Travis                 ACCOUNT NO.:  [id number]  MEDICAL RECORD NO.:  [id number]  LOCATION:                                 FACILITY:  PHYSICIAN:  Charlett Blake, M.D.DATE OF BIRTH:  01-Nov-1940  DATE OF ADMISSION:  09/25/2016 DATE OF DISCHARGE:  10/12/2016                              DISCHARGE SUMMARY   DISCHARGE DIAGNOSES: 1. Left corona radiata infarct. 2. SCDs for deep venous thrombosis prophylaxis. 3. Pain management with headaches. 4. Depression. 5. Concern for complex partial seizures with syncope. 6. Hypertension. 7. 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 History of gout. 9. Hypokalemia, resolved.  HISTORY OF PRESENT ILLNESS:  This is a 76 year old right-handed female, history of depression, breast cancer, migraine headaches, who lives with spouse, independent with a walker prior to admission.  She had had some recent falls.  Presented September 20, 2016, with episode of unresponsiveness while riding in a car with her family and intermittent headaches.  She began to respond after 10-15 minutes.  Family reported three such events since September 17, 2016, as well as complaints of progressive weakness and decrease in mobility.  She had a 30-day cardiac event monitor done last summer in [place], records were not made available.  MRI showed acute infarct, central left corona radiata, nonhemorrhagic.  MRA of the head with focal high-grade left M1 segment stenosis elsewhere negative for intracranial MRA.  The patient did not receive tPA.  Echocardiogram with ejection fraction of 123456, grade 1 diastolic dysfunction.  EEG negative.  Carotid Dopplers recently negative.  Neurology consulted, maintained on aspirin and Plavix for CVA prophylaxis x3 months then Plavix alone.  Findings of questionable prediabetes, hemoglobin A1c 5.6.  She was completing a course of Rocephin for Klebsiella urinary tract infection.  MRI of  cervical thoracic spine due to persistent lower extremity weakness felt to be unremarkable, evaluated by Dr. Kathyrn Sheriff, plans to follow up with Dr. Leta Baptist to consider possible EMG.  Physical and occupational therapy ongoing.  The patient was admitted for comprehensive rehab program.  PAST MEDICAL HISTORY:  See discharge diagnoses.  SOCIAL HISTORY:  Lives with spouse, independent with a walker prior to admission.  FUNCTIONAL STATUS:  Upon admission to [facility], was moderate assist, 8 feet, 2-person handheld assist; moderate assist, side lying in the bed; mod-to-max assist, activities of daily living.  PHYSICAL EXAMINATION:  VITAL SIGNS:  Blood pressure 119/63, pulse 74, temperature 98, respirations 18. GENERAL:  This is a well-developed female. HEENT:  Normocephalic.  EOMs intact.  Right and left ear, external ear canal is normal. NECK:  Supple.  Nontender.  No JVD. CARDIAC:  Regular rhythm without murmur. LUNGS:  Clear to auscultation without wheeze. ABDOMEN:  Soft, nontender.  Good bowel sounds. NEUROLOGIC:  She was alert, followed simple commands.  Reasonable insight and awareness of deficits.  REHABILITATION HOSPITAL COURSE:  The patient was admitted to Inpatient Rehab Services with therapies initiated on a 3-hour daily basis, consisting of physical therapy, occupational therapy, speech therapy and rehabilitation nursing.  The following issues were addressed during the patient's rehabilitation stay.  Pertaining to [name], left corona radiata infarct remained stable.  She continued on aspirin  and Plavix therapy.  She would follow up Neurology Services.  Arrangements had been made to see Dr. Leta Baptist for history of lower extremity weakness as well as possible need for EMG.  SCDs for DVT prophylaxis.  No bleeding episodes.  History of migraine headaches.  She had been placed on Depakote.  Her Topamax had since been discontinued.  Follow up per Neurology Services for  concern for complex partial seizures, syncope. EEG negative.  She continued on Trileptal.  Per Neurology, no clear-cut seizure activity noted, possible complicated by her migraine.  Blood pressures overall well controlled.  She did remain on Klonopin for history of depression as well as Ludiomil.  Recent completion of antibiotics for Klebsiella UTI.  Followup urine culture, greater than 100,000 Enterobacter aerogenes, sensitive to Cipro.  She remained afebrile and placed on contact precaution.  The patient received weekly collaborative interdisciplinary team conferences to discuss estimated length of stay, family teaching, any barriers to her discharge.  Ambulates with a rolling walker, up and downstairs. Strength and endurance continued to improve.  She could gather her belongings for activities of daily living and home making.  Dressing sit- to-stand at sink side.  Some decrease ability to advance her right lower extremity.  Sit-to-stand with supervision assist.  Full family teaching was completed and plan, discharge to home.  DISCHARGE MEDICATIONS: 1. Zyloprim 300 mg p.o. daily. 2. Norvasc 5 mg p.o. daily. 3. Aspirin 325 mg p.o. daily. 4. Lipitor 40 mg daily. 5. Cipro 250 mg p.o. b.i.d. to complete 7-day course. 6. Klonopin 1 mg p.o. at bedtime. 7. Plavix 75 mg p.o. daily. 8. Voltaren gel 4 times daily to affected area. 9. Depakote 500 mg p.o. at bedtime. 10.Ludiomil 50 mg p.o. at bedtime. 11.Trileptal 300 mg p.o. b.i.d. 12.Protonix 40 mg p.o. daily. 13.Fioricet 50-325-40 mg tablet every 6 hours as needed headache.  DIET:  Diabetic diet.  FOLLOWUP:  She would follow up with Dr. Alysia Penna at the Outpatient Rehab Service office as needed; Dr. Leta Baptist, followup for appointment as recommended, question plan for EMG.  Dr. Franchot Heidelberg, medical management.     Savannah Travis, P.A.   ______________________________ Charlett Blake, M.D.    DA/MEDQ  D:  10/11/2016   T:  10/11/2016  Job:  [id number]  cc:   Dr. Carollee Sires, MD

## 2016-10-12 NOTE — Progress Notes (Addendum)
Subjective/Complaints: Patient denies any sweats or chills. No new issues overnight.  ROS: Denies nausea, vomiting, diarrhea, shortness of breath or chest pain  Objective: Vital Signs: Blood pressure 109/65, pulse 64, temperature 97.7 F (36.5 C), temperature source Oral, resp. rate 18, height 4\' 8"  (1.422 m), weight 90.3 kg (199 lb), SpO2 97 %. No results found. No results found for this or any previous visit (from the past 72 hour(s)).   HEENT: Normocephalic, atraumatic.  Cardio: RRR. No JVD. Resp: CTA B/L and Unlabored.  GI: BS positive and nondistended Skin:   Intact. Warm and dry Neuro: Alert/Oriented,  Cranial Nerve II-XII grossly normal Motor: 4+/5 right deltoid, bicep, triceps, grip 4+/5 right hip flexor, knee extensor, ankle dorsiflexor. 4/5 left deltoid, biceps, triceps, grip 4+/5 left hip flexor, knee extensor, ankle dorsi flexor No increase in tone noted Dysarthria Musc/Skel:  No edema, no tenderness Gen. no acute distress. Vital signs reviewed  Assessment/Plan: 1. Functional deficits secondary to left corona radiata infarct, right hemiparesis, aphasia, dysarthria  Stable for D/C today F/u PCP in 3-4 weeks F/u PM&R 2 weeks See D/C summary See D/C instructions FIM: Function - Bathing Position: Shower Body parts bathed by patient: Right arm, Left arm, Chest, Abdomen, Front perineal area, Right upper leg, Left upper leg, Buttocks, Right lower leg, Left lower leg, Back Body parts bathed by helper: Back Bathing not applicable: Right lower leg, Left lower leg Assist Level: Supervision or verbal cues, Set up Set up : To obtain items, To adjust water temperature  Function- Upper Body Dressing/Undressing What is the patient wearing?: Bra, Pull over shirt/dress Bra - Perfomed by patient: Thread/unthread right bra strap, Thread/unthread left bra strap Bra - Perfomed by helper: Hook/unhook bra (pull down sports bra) Pull over shirt/dress - Perfomed by patient:  Thread/unthread right sleeve, Thread/unthread left sleeve, Put head through opening, Pull shirt over trunk Assist Level: Supervision or verbal cues, Set up Set up : To obtain clothing/put away Function - Lower Body Dressing/Undressing What is the patient wearing?: Shoes, Maryln Manuel, Pants Position: Wheelchair/chair at [employer] - Performed by patient: Thread/unthread right underwear leg, Thread/unthread left underwear leg, Pull underwear up/down Underwear - Performed by helper: Pull underwear up/down Pants- Performed by patient: Thread/unthread right pants leg, Thread/unthread left pants leg, Pull pants up/down Pants- Performed by helper: Pull pants up/down Non-skid slipper socks- Performed by helper: Don/doff right sock, Don/doff left sock Shoes - Performed by patient: Don/doff right shoe, Don/doff left shoe, Fasten right, Fasten left Shoes - Performed by helper: Don/doff right shoe, Don/doff left shoe TED Hose - Performed by helper: Don/doff right TED hose, Don/doff left TED hose Assist for footwear: Partial/moderate assist Assist for lower body dressing: Touching or steadying assistance (Pt > 75%)  Function - Toileting Toileting steps completed by patient: Adjust clothing prior to toileting, Adjust clothing after toileting Toileting steps completed by helper: Performs perineal hygiene Toileting Assistive Devices: Grab bar or rail Assist level: More than reasonable time  Function Midwife transfer assistive device: Elevated toilet seat/BSC over toilet, Grab bar Assist level to toilet: Supervision or verbal cues Assist level from toilet: Supervision or verbal cues Assist level to bedside commode (at bedside): Touching or steadying assistance (Pt > 75%) Assist level from bedside commode (at bedside): Touching or steadying assistance (Pt > 75%)  Function - Chair/bed transfer Chair/bed transfer activity did not occur: N/A Chair/bed transfer method: Stand  pivot Chair/bed transfer assist level: Supervision or verbal cues Chair/bed transfer assistive device: Environmental consultant, Armrests  Chair/bed transfer details: Verbal cues for safe use of DME/AE  Function - Locomotion: Wheelchair Will patient use wheelchair at discharge?: Yes Type: Manual Max wheelchair distance: 150 Assist Level: Supervision or verbal cues Assist Level: Supervision or verbal cues Assist Level: Supervision or verbal cues Turns around,maneuvers to table,bed, and toilet,negotiates 3% grade,maneuvers on rugs and over doorsills: No Function - Locomotion: Ambulation Assistive device: Walker-rolling, Orthosis (L knee brace) Max distance: 75 Assist level: Supervision or verbal cues Assist level: Supervision or verbal cues Walk 50 feet with 2 turns activity did not occur: Safety/medical concerns (L knee painful, R knee weak) Assist level: Supervision or verbal cues Walk 150 feet activity did not occur: Safety/medical concerns (L knee pain, poor activity tolerance) Walk 10 feet on uneven surfaces activity did not occur: Safety/medical concerns (L knee painful, R knee weak) Assist level: Touching or steadying assistance (Pt > 75%)  Function - Comprehension Comprehension: Auditory Comprehension assist level: Follows basic conversation/direction with extra time/assistive device  Function - Expression Expression: Verbal Expression assist level: Expresses basic needs/ideas: With extra time/assistive device  Function - Social Interaction Social Interaction assist level: Interacts appropriately 90% of the time - Needs monitoring or encouragement for participation or interaction.  Function - Problem Solving Problem solving assist level: Solves basic 90% of the time/requires cueing < 10% of the time  Function - Memory Memory assist level: Recognizes or recalls 50 - 74% of the time/requires cueing 25 - 49% of the time Patient normally able to recall (first 3 days only): Current season,  Location of own room, Staff names and faces, That he or she is in a hospital   Medical Problem List and Plan: 1.Decreased functional mobility with lower extremity weakness/altered mental status/question syncopesecondary to left corona radiata infarct Discharge home with home health PT, OT 2. DVT Prophylaxis/Anticoagulation: SCDs. Monitor for any signs of DVT 3. Pain Management/headaches: Topamax 25 mg twice a day, chest pain, musculoskeletal chest wall, sports cream, has had cardiac workup for chest pain which was negative. Repeat EKG times 2 reviewed, negative. No pain during physical therapy. Left knee pain, advanced osteoarthritis, medial compartment, as well as patellofemoral, moderate at the lateral compartment.  -Cont Voltaren gel  - knee orthosis 4. Mood: Ludiomil 50 mg daily at bedtime.Klonopin 1 mg daily at bedtime---pt would like this scheduled 5. Neuropsych: This patient iscapable of making decisions on herown behalf. 6. Skin/Wound Care: Routine skin checks 7. 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 Continue Trileptal 300 mg twice a day           -per neuro no clear-cut seizure activity, possible complicated migraine, Topamax discontinued, Depakote added 9.Hypertension. Norvasc 5 mg daily. Monitor with increased mobility, blood pressure is controlled  Blood pressure 109/65, pulse 64, temperature 97.7 F (36.5 C), temperature source Oral, resp. rate 18, height 4\' 8"  (1.422 m), weight 90.3 kg (199 lb), SpO2 97 %.  Vitals:   10/12/16 0500 10/12/16 0837  BP:  109/65  Pulse:    Resp: 18   Temp: 97.7 F (36.5 C)    10.Klebsiella UTI. Ceftin completed  May have spastic neurogenic bladder in addition to UTI. Follow-up with urology as an outpatient, preferably the same physician that saw her in [place] and performed cystoscopy.   UA+, Ucx Enterobacter with multiple drug resistance. It is sensitive  to Cipro, continue 7 day course 11.Hyperlipidemia. Lipitor 12.History of gout. Allopurinol 300 mg daily. Monitor for any gout flareups. No pain at present  13.  Pre renal azotemia, oral intake, improving    Labs ordered for tomorrow 14.  HypoK: Resolved on KCL 87meq dqaily  On potassium, Normal potassium level on 10/08/2016     LOS (Days) 17 A FACE TO FACE EVALUATION WAS PERFORMED  Irasema Chalk E 10/12/2016, 11:12 AM

## 2016-10-15 ENCOUNTER — Telehealth: Payer: Self-pay

## 2016-10-15 ENCOUNTER — Ambulatory Visit: Payer: Self-pay | Admitting: Diagnostic Neuroimaging

## 2016-10-15 NOTE — Telephone Encounter (Signed)
Appointment time 1:30pm, arrive time 1:15pm W/ Dr. Letta Pate Bolan [place] mailed   Transitional Care call-Savannah Travis  1. Are you/is patient experiencing any problems since coming home? No Are there any questions regarding any aspect of care? No 2. Are there any questions regarding medications administration/dosing? No Are meds being taken as prescribed? Yes Patient should review meds with caller to confirm 3. Have there been any falls? No 4. Has Home Health been to the house and/or have they contacted you? Yes  5. Are bowels and bladder emptying properly? Yes  6. Any fevers, problems with breathing, unexpected pain? No 7. Are there any skin problems or new areas of breakdown? No 8. Has the patient/family member arranged specialty MD follow up (ie cardiology/neurology/renal/surgical/etc)? Yes Can we help arrange? No 9. Does the patient need any other services or support that we can help arrange? No 10. Are caregivers following through as expected in assisting the patient? Yes 11. Has the patient quit smoking, drinking alcohol, or using drugs as recommended? Patient doesn't use tobacco products or alcohol   Appointment time, arrive time and who it is with here [address]

## 2016-10-16 ENCOUNTER — Telehealth: Payer: Self-pay | Admitting: Physician Assistant

## 2016-10-16 NOTE — Telephone Encounter (Signed)
Follow Up:; ° ° °Returning your call. °

## 2016-10-17 NOTE — Telephone Encounter (Signed)
Returned pts call.  We discussed her stress test results. Spoke with pt husband, DPR on file, and he verbalized understanding.

## 2016-10-23 ENCOUNTER — Encounter: Payer: Medicare Other | Admitting: Physical Medicine & Rehabilitation

## 2016-10-24 ENCOUNTER — Encounter: Payer: Self-pay | Admitting: Diagnostic Neuroimaging

## 2016-10-24 ENCOUNTER — Ambulatory Visit (INDEPENDENT_AMBULATORY_CARE_PROVIDER_SITE_OTHER): Payer: Medicare Other | Admitting: Diagnostic Neuroimaging

## 2016-10-24 VITALS — BP 169/82 | HR 73

## 2016-10-24 DIAGNOSIS — G40209 Localization-related (focal) (partial) symptomatic epilepsy and epileptic syndromes with complex partial seizures, not intractable, without status epilepticus: Secondary | ICD-10-CM | POA: Diagnosis not present

## 2016-10-24 DIAGNOSIS — R269 Unspecified abnormalities of gait and mobility: Secondary | ICD-10-CM | POA: Diagnosis not present

## 2016-10-24 DIAGNOSIS — R292 Abnormal reflex: Secondary | ICD-10-CM

## 2016-10-24 DIAGNOSIS — R404 Transient alteration of awareness: Secondary | ICD-10-CM

## 2016-10-24 DIAGNOSIS — I63312 Cerebral infarction due to thrombosis of left middle cerebral artery: Secondary | ICD-10-CM

## 2016-10-24 DIAGNOSIS — R531 Weakness: Secondary | ICD-10-CM

## 2016-10-24 MED ORDER — BUTALBITAL-APAP-CAFFEINE 50-325-40 MG PO TABS: 1.0000 | ORAL_TABLET | Freq: Four times a day (QID) | ORAL | 1 refills | Status: AC | PRN

## 2016-10-24 MED ORDER — DIVALPROEX SODIUM ER 500 MG PO TB24: 500.0000 mg | ORAL_TABLET | Freq: Two times a day (BID) | ORAL | 12 refills | Status: DC

## 2016-10-24 MED ORDER — OXCARBAZEPINE 300 MG PO TABS: 300.0000 mg | ORAL_TABLET | Freq: Two times a day (BID) | ORAL | 12 refills | Status: DC

## 2016-10-24 NOTE — Progress Notes (Signed)
[facility]  PATIENT: Savannah Travis DOB: 25-Aug-1941  REFERRING CLINICIAN: Keane Police HISTORY FROM: patient, husband, daughter  REASON FOR VISIT: new consult / existing patient   HISTORICAL  CHIEF COMPLAINT:  Chief Complaint  Patient presents with  . Syncope and collapse    rm 6, husband- Jeneen Rinks, dgtrJenny Reichmann, "having what could be start of seizures-sharp pain in head, but if she sleeps 2 hrs she's ok; severity has decreased but happening daily"   . Follow-up    3 month  . L carpel tunnel    wants to discuss    HISTORY OF PRESENT ILLNESS:   UPDATE 10/24/16: Since last visit patient forcefully has had several new issues. Patient continues to have intermittent staring spells, possible seizures in spite of starting medication. Patient went to emergency room on 09/11/16 for gait and balance difficulty. She was evaluated there and set up with follow-up in outpatient clinics and recommendation for home health therapy and service evaluation. Patient then returned to emergency room on 09/20/16 for altered mental status and unresponsiveness. Patient had similar unresponsive episode is her prior seizures but was more severe. She then developed slurred speech and generalized weakness. Therefore patient was brought to hospital for further evaluation. Patient was found to have an acute left basal ganglia ischemic infarction. Patient was admitted and had stroke workup. Patient then went to inpatient rehabilitation. Medications were adjusted and she was discharged home on 10/11/16.  Since then patient still having intermittent staring spells. No generalized convulsive seizures.   Patient also having more problems with bladder incontinence. Apparently urologist would like to try some type of medication or treatment, but is reluctant to start this because they would like to have input from neurology standpoint.   Patient having intermittent headaches and sharp shooting pains.  Patient taking Tylenol for headaches.  Patient also having left hand numbness and pain. However this occurred since July 2017 when she fell and fractured her left wrist, requiring surgery. Following surgery patient had increasing numbness and pain in her left wrist. Patient referred to me today from orthopedic surgery for evaluation of possible left carpal tunnel syndrome.   PRIOR HPI (07/24/16): 76 year old right-handed female here for evaluation of constellation of symptoms including head pain, squeezing pressure headaches, passing out spells. History of migraine, depression, anxiety, hypertension, hypercholesteremia, breast cancer.  In January 2015 patient had episode of falling backwards, hitting her head and passing out. Unclear if patient passed out and fell down or she tripped, fell and then passed out. Patient woke up with pain in the back of her head. She did not seek medical attention.   Since that time patient has had over 50 episodes of unprovoked syncope and passing out. Sometimes these are proceeded by patient making the sound "oh oh". Patient has had episodes of staring spells as well. With some of these episodes patient had witnessed/documented low heart rate in the 40s. Blood pressure during these attacks when it had been checked was normal to slightly elevated.   Patient has had evaluation by PCP, hospital admission in September 2017, cardiology consultation, without specific etiology found. Patient had metoprolol medication at the time which was discontinued as patient has some episodes of bradycardia. Patient had neurology evaluation and has been treated empirically for seizures with oxcarbazepine. Since that time no further syncope or seizure attacks.  In addition patient has intermittent "zinger" pain in her head lasting for 1 second at a time every other day. Sometimes this happens once every 2  weeks. Patient was diagnosed with possible occipital neuralgia, treated with occipital  nerve block without relief. Patient was also tried on gabapentin without relief.  Patient also has intermittent "gripping pressure" squeezing pain and pressure in her head lasting 2-3 minutes at a time, 4 days out of the week, 2-3 times per day.  Patient has history of headaches since teenage years consisting of intense global severe pain associated with menstrual cycle. Sometimes stress or exams would also seem to trigger headaches. Patient took over-the-counter medications. No nausea, vomiting, sensitivity to light or sound.   REVIEW OF SYSTEMS: Full 14 system review of systems performed and negative with exception of:  Chills drooling daytime sleepiness nervousness anxiety decreased frustration weakness seizures headache painful urination incontinence urgency.   ALLERGIES: Allergies  Allergen Reactions  . Demerol [Meperidine] Nausea And Vomiting  . Integra [Fe Fum-Fepoly-Vit C-Vit B3] Diarrhea  . Magnesium Oxide Diarrhea  . Metformin And Related Diarrhea  . Sulfonylureas Swelling and Rash    HOME MEDICATIONS: Outpatient Medications Prior to Visit  Medication Sig Dispense Refill  . allopurinol (ZYLOPRIM) 300 MG tablet Take 1 tablet (300 mg total) by mouth every morning. 30 tablet 1  . amLODipine (NORVASC) 5 MG tablet Take 1 tablet (5 mg total) by mouth every morning. 30 tablet 1  . aspirin EC 325 MG EC tablet Take 1 tablet (325 mg total) by mouth daily. 30 tablet 0  . atorvastatin (LIPITOR) 40 MG tablet Take 1 tablet (40 mg total) by mouth at bedtime. 30 tablet 1  . butalbital-acetaminophen-caffeine (FIORICET, ESGIC) 50-325-40 MG tablet Take 1 tablet by mouth every 6 (six) hours as needed for headache. 20 tablet 0  . calcium-vitamin D (CALCIUM 500/D) 500-200 MG-UNIT tablet Take 1 tablet by mouth at bedtime.     . clonazePAM (KLONOPIN) 1 MG tablet Take 1 tablet (1 mg total) by mouth at bedtime as needed for anxiety. 14 tablet 0  . clopidogrel (PLAVIX) 75 MG tablet Take 1 tablet (75 mg  total) by mouth daily. 30 tablet 0  . Coenzyme Q10 (COQ10) 200 MG CAPS Take 200 mg by mouth 2 (two) times daily.    . diclofenac sodium (VOLTAREN) 1 % GEL Apply 2 g topically 4 (four) times daily. 1 Tube 1  . divalproex (DEPAKOTE ER) 500 MG 24 hr tablet Take 1 tablet (500 mg total) by mouth at bedtime. 30 tablet 1  . Glucos-Chond-Hyal Ac-Ca Fructo (MOVE FREE JOINT HEALTH ADVANCE PO) Take 1 tablet by mouth every morning.     . maprotiline (LUDIOMIL) 50 MG tablet Take 1 tablet (50 mg total) by mouth at bedtime. 30 tablet 1  . montelukast (SINGULAIR) 10 MG tablet Take 10 mg by mouth daily as needed (for SOB).     . Oxcarbazepine (TRILEPTAL) 300 MG tablet Take 1 tablet (300 mg total) by mouth 2 (two) times daily. 60 tablet 0  . Pancrelipase, Lip-Prot-Amyl, 5000 units CPEP Take 5,000-10,000 Units by mouth daily as needed (diarrhea).     . pantoprazole (PROTONIX) 40 MG tablet Take 1 tablet (40 mg total) by mouth daily. 30 tablet 0  . ciprofloxacin (CIPRO) 250 MG tablet Take 1 tablet (250 mg total) by mouth 2 (two) times daily. 14 tablet 0   No facility-administered medications prior to visit.     PAST MEDICAL HISTORY: Past Medical History:  Diagnosis Date  . Cancer (HCC)    breast  . Headache    migraines  . Hypertension   . S/P wrist surgery 2015, 2017  for fx  . Seizures ([place])   . Stroke [facility]) 08/2016    PAST SURGICAL HISTORY: Past Surgical History:  Procedure Laterality Date  . BREAST SURGERY Right 2012   lumps removed  . OVARIAN CYST SURGERY  1962    FAMILY HISTORY: Family History  Problem Relation Age of Onset  . Stroke Father   . CAD Father   . Cancer Sister   . Breast cancer Sister   . Colon cancer Sister   . Stroke Other   . Rheum arthritis Neg Hx     SOCIAL HISTORY:  Social History   Social History  . Marital status: Married    Spouse name: Jeneen Rinks  . Number of children: 2  . Years of education: 12   Occupational History  .      [employer], retired     Social History Main Topics  . Smoking status: Never Smoker  . Smokeless tobacco: Never Used  . Alcohol use No  . Drug use: No  . Sexual activity: Not on file   Other Topics Concern  . Not on file   Social History Narrative   Lives with husband   Caffeine - coffee, 1 cup daily     PHYSICAL EXAM  GENERAL EXAM/CONSTITUTIONAL: Vitals:  Vitals:   10/24/16 1438  BP: (!) 169/82  Pulse: 73   There is no height or weight on file to calculate BMI. No exam data present  Patient is in no distress; well developed, nourished and groomed; neck is supple  CARDIOVASCULAR:  Examination of carotid arteries is normal; no carotid bruits  Regular rate and rhythm, no murmurs  Examination of peripheral vascular system by observation and palpation is normal  EYES:  Ophthalmoscopic exam of optic discs and posterior segments is normal; no papilledema or hemorrhages  MUSCULOSKELETAL:  Gait, strength, tone, movements noted in Neurologic exam below  NEUROLOGIC: MENTAL STATUS:  No flowsheet data found.  awake, alert, oriented to person, place and time  recent and remote memory intact  normal attention and concentration  language fluent, comprehension intact, naming intact,   fund of knowledge appropriate  CRANIAL NERVE:   2nd - no papilledema on fundoscopic exam  2nd, 3rd, 4th, 6th - pupils equal and reactive to light, visual fields full to confrontation, extraocular muscles intact, no nystagmus  5th - facial sensation symmetric  7th - facial strength symmetric  8th - hearing intact  9th - palate elevates symmetrically, uvula midline  11th - shoulder shrug symmetric  12th - tongue protrusion midline  MILD SLURRED SPEECH  MOTOR:   normal bulk; SLIGHTLY INCREASED TONE IN LOWER EXT  DIFFUSE 4/5 STRENGTH; RIGHT SLIGHTLY WEAKER THAN LEFT  SENSORY:   normal and symmetric to light touch, temperature, vibration  DECR ON RIGHT SIDE  COORDINATION:    finger-nose-finger, fine finger movements SLOW   REFLEXES:   deep tendon reflexes BRISK IN UPPER EXT AND KNEES and symmetric  GAIT/STATION:   IN WHEEL CHAIR; CANNOT STAND UNASSISTED    DIAGNOSTIC DATA (LABS, IMAGING, TESTING) - I reviewed patient records, labs, notes, testing and imaging myself where available.  Lab Results  Component Value Date   WBC 5.7 10/08/2016   HGB 11.4 (L) 10/08/2016   HCT 36.6 10/08/2016   MCV 90.1 10/08/2016   PLT 277 10/08/2016      Component Value Date/Time   NA 139 10/08/2016 0500   K 3.8 10/08/2016 0500   CL 107 10/08/2016 0500   CO2 24 10/08/2016 0500  GLUCOSE 92 10/08/2016 0500   BUN 23 (H) 10/08/2016 0500   CREATININE 1.15 (H) 10/08/2016 0500   CALCIUM 8.9 10/08/2016 0500   PROT 7.0 09/26/2016 1018   ALBUMIN 3.9 09/26/2016 1018   AST 32 09/26/2016 1018   ALT 20 09/26/2016 1018   ALKPHOS 90 09/26/2016 1018   BILITOT 0.9 09/26/2016 1018   GFRNONAA 45 (L) 10/08/2016 0500   GFRAA 53 (L) 10/08/2016 0500   Lab Results  Component Value Date   CHOL 156 09/22/2016   HDL 47 09/22/2016   LDLCALC 87 09/22/2016   TRIG 111 09/22/2016   CHOLHDL 3.3 09/22/2016   Lab Results  Component Value Date   HGBA1C 5.6 09/22/2016   No results found for: VITAMINB12 No results found for: TSH  06/05/16 EEG - normal EEG in awake and drowsy states  09/21/16 MRI brain [I reviewed images myself and agree with interpretation. -VRP]  1. Acute infarct in the central left corona radiata, nonhemorrhagic. 2. Focal high-grade left M1 segment stenosis. Elsewhere negative intracranial MRA. 3. 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  09/21/16 MRA head  1. Acute infarct in the central left corona radiata, nonhemorrhagic. 2. Focal high-grade left M1 segment stenosis. Elsewhere negative intracranial MRA. 3. 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   09/21/16 MRI  cervical spine - No acute or likely significant finding in the cervical region. No change since the study of November. Degenerative facet arthropathy left worse than right. Mild disc degeneration. No compressive stenosis of the canal or foramina. No primary cord lesion.  09/21/16 MRI thoracic  - Thoracic scoliosis. - Shallow disc protrusion at T2-3 without significant neural compression. - No other disc protrusions, spinal or foraminal stenosis in the thoracic spine. - Normal MR appearance of the thoracic spinal cord.  09/21/16 MRI lumbar spine - No likely significant finding at L3-4 or above. - Old compression fracture at L5 which is healed. Posterior bowing of the posterosuperior margin of the vertebral body. Mild bulging of the L4-5 disc. In combination with the L5 bowing hand with bilateral facet and ligamentous hypertrophy, there is moderate stenosis at this level that could possibly cause neural compression. - L5-S1 facet arthropathy with gaping, fluid-filled facet joints. Mild bulging of the disc. Mild narrowing of the subarticular lateral recesses and neural foramina. This appearance would likely worsen with standing or flexion based on the appearance of the facet arthropathy.      ASSESSMENT AND PLAN  76 y.o. year old female here with  multiple episodes of recurrent unprovoked passing out attacks (> 50 since 2015) with syncope and seizure features. Considerations would include beta blocker induced bradycardia and syncopal attacks. Other consideration would include complex partial seizures. The multiple attacks, stereotyped, sometimes preceded by a moan or scream, sometimes preceded by staring spells are highly suspicious for complex partial seizures.   Patient's gait and balance difficulties may be due to underlying cervical myelopathy and we will check MRI cervical spine for further evaluation.  Patient also has long-standing history of probable migraine headaches. 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  Now with new left basal ganglia ischemic infarction (Dec 2017).   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     Ddx: seizure (complex partial) + accelerated cerebro-vascular disease + neurodegenerative disease (MSA, parkinson's plus)  1. Partial symptomatic epilepsy with complex partial seizures, not intractable, without status epilepticus ([place])   2.  Gait difficulty   3. Hyperreflexia   4. 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 Transient alteration of awareness   6. Weakness generalized      PLAN:  I spent 40 minutes of face to face time with patient. Greater than 50% of time was spent in counseling and coordination of care with patient. 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 Oxcarbazepine (TRILEPTAL) 300 MG tablet    Sig: Take 1 tablet (300 mg total) by mouth 2 (two) times daily.    Dispense:  60 tablet    Refill:  12  . butalbital-acetaminophen-caffeine (FIORICET, ESGIC) 50-325-40 MG tablet    Sig: Take 1 tablet by mouth every 6 (six) hours as needed for headache.    Dispense:  30 tablet    Refill:  1  . divalproex (DEPAKOTE ER) 500 MG 24 hr tablet    Sig: Take 1 tablet (500 mg total) by mouth 2 (two) times daily.    Dispense:  60 tablet    Refill:  12   Return in about 3 months (around 01/21/2017).  I reviewed images, labs, notes, records myself. I summarized findings and reviewed  with patient, for this high risk condition (stroke, seizure) requiring high complexity decision making.    Penni Bombard, MD [id number], [id number] PM Certified in Neurology, Neurophysiology and Neuroimaging  [facility] [address] [place], [place] [address] [phone]

## 2016-10-26 ENCOUNTER — Encounter: Payer: Self-pay | Admitting: Physical Medicine & Rehabilitation

## 2016-10-26 ENCOUNTER — Encounter: Payer: Medicare Other | Attending: Physical Medicine & Rehabilitation

## 2016-10-26 ENCOUNTER — Ambulatory Visit (HOSPITAL_BASED_OUTPATIENT_CLINIC_OR_DEPARTMENT_OTHER): Payer: Medicare Other | Admitting: Physical Medicine & Rehabilitation

## 2016-10-26 VITALS — BP 144/82 | HR 79 | Resp 14

## 2016-10-26 DIAGNOSIS — I69398 Other sequelae of cerebral infarction: Secondary | ICD-10-CM | POA: Insufficient documentation

## 2016-10-26 DIAGNOSIS — Z853 Personal history of malignant neoplasm of breast: Secondary | ICD-10-CM | POA: Diagnosis not present

## 2016-10-26 DIAGNOSIS — R269 Unspecified abnormalities of gait and mobility: Secondary | ICD-10-CM

## 2016-10-26 DIAGNOSIS — E785 Hyperlipidemia, unspecified: Secondary | ICD-10-CM | POA: Diagnosis not present

## 2016-10-26 DIAGNOSIS — G40909 Epilepsy, unspecified, not intractable, without status epilepticus: Secondary | ICD-10-CM | POA: Insufficient documentation

## 2016-10-26 DIAGNOSIS — F329 Major depressive disorder, single episode, unspecified: Secondary | ICD-10-CM | POA: Diagnosis not present

## 2016-10-26 DIAGNOSIS — R2689 Other abnormalities of gait and mobility: Secondary | ICD-10-CM | POA: Diagnosis not present

## 2016-10-26 DIAGNOSIS — G43909 Migraine, unspecified, not intractable, without status migrainosus: Secondary | ICD-10-CM | POA: Insufficient documentation

## 2016-10-26 DIAGNOSIS — I10 Essential (primary) hypertension: Secondary | ICD-10-CM | POA: Diagnosis not present

## 2016-10-26 NOTE — Progress Notes (Signed)
Subjective:    Patient ID: Savannah Travis, female    DOB: 21-Mar-1941, 76 y.o.   MRN: [id number] 76 year old right-handed female, history of depression, breast cancer, migraine headaches, who lives with spouse, independent with a walker prior to admission.  She had had some recent falls.  Presented September 20, 2016, with episode of unresponsiveness while riding in a car with her family and intermittent headaches.  She began to respond after 10-15 minutes.  Family reported three such events since September 17, 2016, as well as complaints of progressive weakness and decrease in mobility.  She had a 30-day cardiac event monitor done last summer in [place], records were not made available.  MRI showed acute infarct, central left corona radiata, nonhemorrhagic.  MRA of the head with focal high-grade left M1 segment stenosis elsewhere negative for intracranial MRA.  The patient did not receive tPA.  Echocardiogram with ejection fraction of 123456, grade 1 diastolic dysfunction.  EEG negative.  Carotid Dopplers recently negative.  Neurology consulted, maintained on aspirin and Plavix for CVA HPI Seen by PCP yesterday Seen by Neuro 1/31, feels like "spells" are complex partial seizure Left hand tingles  Dressing self but needs assist with bathing Balance remains poor uses bench in shower  Some tingling in Left hand, hx of left wrist fracture Seen by Neuro felt this may be post traumatic CTS Pain Inventory Average Pain 3 Pain Right Now 3 My pain is intermittent and stabbing  In the last 24 hours, has pain interfered with the following? General activity 5 Relation with others 5 Enjoyment of life 5 What TIME of day is your pain at its worst? daytime Sleep (in general) Good  Pain is worse with: some activites Pain improves with: rest and medication Relief from Meds: varies  Mobility walk with assistance use a walker how many minutes can you walk? 5 ability to climb steps?  yes do you  drive?  no use a wheelchair needs help with transfers Do you have any goals in this area?  yes  Function retired I need assistance with the following:  feeding, dressing, bathing, meal prep, household duties and shopping  Neuro/Psych bladder control problems weakness numbness tingling trouble walking depression anxiety  Prior Studies x-rays hospital follow up  Physicians involved in your care hospital follow up   Family History  Problem Relation Age of Onset  . Stroke Father   . CAD Father   . Cancer Sister   . Breast cancer Sister   . Colon cancer Sister   . Stroke Other   . Rheum arthritis Neg Hx    Social History   Social History  . Marital status: Married    Spouse name: Jeneen Rinks  . Number of children: 2  . Years of education: 12   Occupational History  .      [employer], retired   Social History Main Topics  . Smoking status: Never Smoker  . Smokeless tobacco: Never Used  . Alcohol use No  . Drug use: No  . Sexual activity: Not Asked   Other Topics Concern  . None   Social History Narrative   Lives with husband   Caffeine - coffee, 1 cup daily   Past Surgical History:  Procedure Laterality Date  . BREAST SURGERY Right 2012   lumps removed  . OVARIAN CYST SURGERY  1962   Past Medical History:  Diagnosis Date  . Cancer (HCC)    breast  . Headache    migraines  .  Hypertension   . S/P wrist surgery 2015, 2017   for fx  . Seizures ([place])   . Stroke ([place]) 08/2016   BP (!) 144/82   Pulse 79   Resp 14   LMP  (LMP Unknown)   SpO2 92%   Opioid Risk Score:   Fall Risk Score:  `1  Depression screen PHQ 2/9  No flowsheet data found.  Review of Systems  HENT: Negative.   Eyes: Negative.   Respiratory: Positive for shortness of breath and wheezing.   Cardiovascular: Negative.   Gastrointestinal: Negative.   Endocrine: Negative.   Genitourinary: Negative.        Painful urination  Musculoskeletal: Positive for arthralgias.    Neurological: Positive for weakness, numbness and headaches.       Tingling  Psychiatric/Behavioral: Positive for dysphoric mood. The patient is nervous/anxious.        Objective:   Physical Exam  Constitutional: She is oriented to person, place, and time. She appears well-developed and well-nourished.  HENT:  Head: Normocephalic and atraumatic.  Eyes: Conjunctivae and EOM are normal. Pupils are equal, round, and reactive to light.  Cardiovascular: Normal rate, regular rhythm and normal heart sounds.   Pulmonary/Chest: Effort normal and breath sounds normal. No respiratory distress. She has no wheezes.  Abdominal: Soft. Bowel sounds are normal. She exhibits no distension. There is no tenderness.  Neurological: She is alert and oriented to person, place, and time.  Psychiatric: Her affect is blunt. Her speech is delayed. She is slowed.  Nursing note and vitals reviewed. Motor strength is 4/5, bilateral deltoid, bicep, tricep, grip. Wrist flexion and extension on the left side are 4 minus She has pain with shoulder abduction bilaterally and tightness. Negative straight leg raise bilaterally. Lower extremity strength is 4 minus at the hip flexor, knee extensor, ankle dorsiflexors bilaterally.        Assessment & Plan:  1.  Right corona radiata infarct with reduced mobility and mild RUE weakness, Continue Plavix and aspirin. Then Plavix alone Will cont HHPT, OT, SLP Likely will be ready for outpt therapy in ~51mo Patient already has an outpatient visit scheduled with her primary care physician in one month We discussed that her primary care physician can refer her to a outpatient therapy facility which is closer to her home in [facility]. If she wishes to remain in the [facility] system. She can follow-up with me and we can set up some additional outpatient therapy, perhaps in [place]  2. Complex partial seizures. Follow-up with Dr. Blanche East neurology, Depakote 500 ER 2  times a day Oxcarbazepine 300mg  BID  3. Hypertension, hyperlipidemia, stroke risk factor reduction, follow-up with primary care physician

## 2017-01-23 ENCOUNTER — Encounter: Payer: Self-pay | Admitting: Diagnostic Neuroimaging

## 2017-01-23 ENCOUNTER — Ambulatory Visit (INDEPENDENT_AMBULATORY_CARE_PROVIDER_SITE_OTHER): Payer: Medicare Other | Admitting: Diagnostic Neuroimaging

## 2017-01-23 VITALS — BP 161/87 | HR 90

## 2017-01-23 DIAGNOSIS — G40209 Localization-related (focal) (partial) symptomatic epilepsy and epileptic syndromes with complex partial seizures, not intractable, without status epilepticus: Secondary | ICD-10-CM

## 2017-01-23 DIAGNOSIS — I63312 Cerebral infarction due to thrombosis of left middle cerebral artery: Secondary | ICD-10-CM | POA: Diagnosis not present

## 2017-01-23 DIAGNOSIS — R269 Unspecified abnormalities of gait and mobility: Secondary | ICD-10-CM

## 2017-01-23 MED ORDER — DIVALPROEX SODIUM ER 500 MG PO TB24: 500.0000 mg | ORAL_TABLET | Freq: Every day | ORAL | 4 refills | Status: AC

## 2017-01-23 MED ORDER — OXCARBAZEPINE 300 MG PO TABS: 300.0000 mg | ORAL_TABLET | Freq: Two times a day (BID) | ORAL | 4 refills | Status: DC

## 2017-01-23 NOTE — Progress Notes (Signed)
[facility]  PATIENT: Savannah Travis DOB: 28-May-1941  REFERRING CLINICIAN: Keane Police HISTORY FROM: patient, husband, daughter  REASON FOR VISIT: follow up   HISTORICAL  CHIEF COMPLAINT:  Chief Complaint  Patient presents with  . Partial symptomatic epilepsy    rm 6, husband- Jeneen Rinks, dgtr- Cindy, "no seizure activity"  . Follow-up    3 month    HISTORY OF PRESENT ILLNESS:   UPDATE 01/23/17: Since last visit, doing well. Not many staring spells. Tolerating meds. Mainly in the wheelchair. Some intermittent headaches.  UPDATE 10/24/16: Since last visit patient forcefully has had several new issues. Patient continues to have intermittent staring spells, possible seizures in spite of starting medication. Patient went to emergency room on 09/11/16 for gait and balance difficulty. She was evaluated there and set up with follow-up in outpatient clinics and recommendation for home health therapy and service evaluation. Patient then returned to emergency room on 09/20/16 for altered mental status and unresponsiveness. Patient had similar unresponsive episode is her prior seizures but was more severe. She then developed slurred speech and generalized weakness. Therefore patient was brought to hospital for further evaluation. Patient was found to have an acute left basal ganglia ischemic infarction. Patient was admitted and had stroke workup. Patient then went to inpatient rehabilitation. Medications were adjusted and she was discharged home on 10/11/16.  Since then patient still having intermittent staring spells. No generalized convulsive seizures.   Patient also having more problems with bladder incontinence. Apparently urologist would like to try some type of medication or treatment, but is reluctant to start this because they would like to have input from neurology standpoint.   Patient having intermittent headaches and sharp shooting pains. Patient taking Tylenol for  headaches.  Patient also having left hand numbness and pain. However this occurred since July 2017 when she fell and fractured her left wrist, requiring surgery. Following surgery patient had increasing numbness and pain in her left wrist. Patient referred to me today from orthopedic surgery for evaluation of possible left carpal tunnel syndrome.   PRIOR HPI (07/24/16): 76 year old right-handed female here for evaluation of constellation of symptoms including head pain, squeezing pressure headaches, passing out spells. History of migraine, depression, anxiety, hypertension, hypercholesteremia, breast cancer.  In January 2015 patient had episode of falling backwards, hitting her head and passing out. Unclear if patient passed out and fell down or she tripped, fell and then passed out. Patient woke up with pain in the back of her head. She did not seek medical attention.   Since that time patient has had over 50 episodes of unprovoked syncope and passing out. Sometimes these are proceeded by patient making the sound "oh oh". Patient has had episodes of staring spells as well. With some of these episodes patient had witnessed/documented low heart rate in the 40s. Blood pressure during these attacks when it had been checked was normal to slightly elevated.   Patient has had evaluation by PCP, hospital admission in September 2017, cardiology consultation, without specific etiology found. Patient had metoprolol medication at the time which was discontinued as patient has some episodes of bradycardia. Patient had neurology evaluation and has been treated empirically for seizures with oxcarbazepine. Since that time no further syncope or seizure attacks.  In addition patient has intermittent "zinger" pain in her head lasting for 1 second at a time every other day. Sometimes this happens once every 2 weeks. Patient was diagnosed with possible occipital neuralgia, treated with occipital nerve block without relief.  Patient was also tried on gabapentin without relief.  Patient also has intermittent "gripping pressure" squeezing pain and pressure in her head lasting 2-3 minutes at a time, 4 days out of the week, 2-3 times per day.  Patient has history of headaches since teenage years consisting of intense global severe pain associated with menstrual cycle. Sometimes stress or exams would also seem to trigger headaches. Patient took over-the-counter medications. No nausea, vomiting, sensitivity to light or sound.   REVIEW OF SYSTEMS: Full 14 system review of systems performed and negative with exception of:  Memory loss speech diff weakness confusion depression anxiety.   ALLERGIES: Allergies  Allergen Reactions  . Demerol [Meperidine] Nausea And Vomiting  . Integra [Fe Fum-Fepoly-Vit C-Vit B3] Diarrhea  . Magnesium Oxide Diarrhea  . Metformin And Related Diarrhea  . Sulfonylureas Swelling and Rash    HOME MEDICATIONS: Outpatient Medications Prior to Visit  Medication Sig Dispense Refill  . allopurinol (ZYLOPRIM) 300 MG tablet Take 1 tablet (300 mg total) by mouth every morning. 30 tablet 1  . amLODipine (NORVASC) 5 MG tablet Take 1 tablet (5 mg total) by mouth every morning. 30 tablet 1  . aspirin EC 325 MG EC tablet Take 1 tablet (325 mg total) by mouth daily. 30 tablet 0  . atorvastatin (LIPITOR) 40 MG tablet Take 1 tablet (40 mg total) by mouth at bedtime. 30 tablet 1  . butalbital-acetaminophen-caffeine (FIORICET, ESGIC) 50-325-40 MG tablet Take 1 tablet by mouth every 6 (six) hours as needed for headache. 30 tablet 1  . calcium-vitamin D (CALCIUM 500/D) 500-200 MG-UNIT tablet Take 1 tablet by mouth at bedtime.     . clonazePAM (KLONOPIN) 1 MG tablet Take 1 tablet (1 mg total) by mouth at bedtime as needed for anxiety. 14 tablet 0  . clopidogrel (PLAVIX) 75 MG tablet Take 1 tablet (75 mg total) by mouth daily. 30 tablet 0  . Coenzyme Q10 (COQ10) 200 MG CAPS Take 200 mg by mouth 2 (two) times  daily.    . diclofenac sodium (VOLTAREN) 1 % GEL Apply 2 g topically 4 (four) times daily. 1 Tube 1  . divalproex (DEPAKOTE ER) 500 MG 24 hr tablet Take 1 tablet (500 mg total) by mouth 2 (two) times daily. 60 tablet 12  . escitalopram (LEXAPRO) 10 MG tablet Take 10 mg by mouth daily.    . Glucos-Chond-Hyal Ac-Ca Fructo (MOVE FREE JOINT HEALTH ADVANCE PO) Take 1 tablet by mouth every morning.     . maprotiline (LUDIOMIL) 50 MG tablet Take 1 tablet (50 mg total) by mouth at bedtime. 30 tablet 1  . montelukast (SINGULAIR) 10 MG tablet Take 10 mg by mouth daily as needed (for SOB).     Marland Kitchen omeprazole (PRILOSEC) 10 MG capsule Take 10 mg by mouth daily.    . Oxcarbazepine (TRILEPTAL) 300 MG tablet Take 1 tablet (300 mg total) by mouth 2 (two) times daily. 60 tablet 12  . Pancrelipase, Lip-Prot-Amyl, 5000 units CPEP Take 5,000-10,000 Units by mouth daily as needed (diarrhea).      No facility-administered medications prior to visit.     PAST MEDICAL HISTORY: Past Medical History:  Diagnosis Date  . Cancer (HCC)    breast  . Headache    migraines  . Hypertension   . S/P wrist surgery 2015, 2017   for fx  . Seizures ([place])   . Stroke [facility]) 08/2016    PAST SURGICAL HISTORY: Past Surgical History:  Procedure Laterality Date  . BREAST SURGERY Right  2012   lumps removed  . OVARIAN CYST SURGERY  1962    FAMILY HISTORY: Family History  Problem Relation Age of Onset  . Stroke Father   . CAD Father   . Cancer Sister   . Breast cancer Sister   . Colon cancer Sister   . Stroke Other   . Rheum arthritis Neg Hx     SOCIAL HISTORY:  Social History   Social History  . Marital status: Married    Spouse name: Jeneen Rinks  . Number of children: 2  . Years of education: 12   Occupational History  .      [employer], retired   Social History Main Topics  . Smoking status: Never Smoker  . Smokeless tobacco: Never Used  . Alcohol use No  . Drug use: No  . Sexual activity: Not on file    Other Topics Concern  . Not on file   Social History Narrative   Lives with husband, caregiver daily   Caffeine - coffee, 1 cup daily     PHYSICAL EXAM  GENERAL EXAM/CONSTITUTIONAL: Vitals:  Vitals:   01/23/17 1512  BP: (!) 161/87  Pulse: 90   There is no height or weight on file to calculate BMI. No exam data present  Patient is in no distress; well developed, nourished and groomed; neck is supple  CARDIOVASCULAR:  Examination of carotid arteries is normal; no carotid bruits  Regular rate and rhythm, no murmurs  Examination of peripheral vascular system by observation and palpation is normal  EYES:  Ophthalmoscopic exam of optic discs and posterior segments is normal; no papilledema or hemorrhages  MUSCULOSKELETAL:  Gait, strength, tone, movements noted in Neurologic exam below  NEUROLOGIC: MENTAL STATUS:  No flowsheet data found.  awake, alert, oriented to person, place and time  recent and remote memory intact  normal attention and concentration  language fluent, comprehension intact, naming intact,   fund of knowledge appropriate  CRANIAL NERVE:   2nd - no papilledema on fundoscopic exam  2nd, 3rd, 4th, 6th - pupils equal and reactive to light, visual fields full to confrontation, extraocular muscles intact, no nystagmus  5th - facial sensation symmetric  7th - facial strength symmetric  8th - hearing intact  9th - palate elevates symmetrically, uvula midline  11th - shoulder shrug symmetric  12th - tongue protrusion midline  MILD SLURRED SPEECH  MOTOR:   normal bulk; SLIGHTLY INCREASED TONE IN LOWER EXT  BUE 4  BLE 3  RIGHT SLIGHTLY WEAKER THAN LEFT  SENSORY:   normal and symmetric to light touch, temperature, vibration  DECR ON RIGHT SIDE  COORDINATION:   finger-nose-finger, fine finger movements SLOW   REFLEXES:   deep tendon reflexes BRISK IN UPPER EXT AND KNEES and symmetric  GAIT/STATION:   IN WHEEL  CHAIR; CANNOT STAND UNASSISTED    DIAGNOSTIC DATA (LABS, IMAGING, TESTING) - I reviewed patient records, labs, notes, testing and imaging myself where available.  Lab Results  Component Value Date   WBC 5.7 10/08/2016   HGB 11.4 (L) 10/08/2016   HCT 36.6 10/08/2016   MCV 90.1 10/08/2016   PLT 277 10/08/2016      Component Value Date/Time   NA 139 10/08/2016 0500   K 3.8 10/08/2016 0500   CL 107 10/08/2016 0500   CO2 24 10/08/2016 0500   GLUCOSE 92 10/08/2016 0500   BUN 23 (H) 10/08/2016 0500   CREATININE 1.15 (H) 10/08/2016 0500   CALCIUM 8.9 10/08/2016 0500  PROT 7.0 09/26/2016 1018   ALBUMIN 3.9 09/26/2016 1018   AST 32 09/26/2016 1018   ALT 20 09/26/2016 1018   ALKPHOS 90 09/26/2016 1018   BILITOT 0.9 09/26/2016 1018   GFRNONAA 45 (L) 10/08/2016 0500   GFRAA 53 (L) 10/08/2016 0500   Lab Results  Component Value Date   CHOL 156 09/22/2016   HDL 47 09/22/2016   LDLCALC 87 09/22/2016   TRIG 111 09/22/2016   CHOLHDL 3.3 09/22/2016   Lab Results  Component Value Date   HGBA1C 5.6 09/22/2016   No results found for: VITAMINB12 No results found for: TSH  06/05/16 EEG - normal EEG in awake and drowsy states  09/21/16 MRI brain [I reviewed images myself and agree with interpretation. -[facility]]  1. Acute infarct in the central left corona radiata, nonhemorrhagic. 2. Focal high-grade left M1 segment stenosis. Elsewhere negative intracranial MRA. 3. 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  09/21/16 MRA head  1. Acute infarct in the central left corona radiata, nonhemorrhagic. 2. Focal high-grade left M1 segment stenosis. Elsewhere negative intracranial MRA. 3. 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   09/21/16 MRI cervical spine - No acute or likely significant finding in the cervical region. No change since the study of November. Degenerative facet arthropathy left worse than  right. Mild disc degeneration. No compressive stenosis of the canal or foramina. No primary cord lesion.  09/21/16 MRI thoracic  - Thoracic scoliosis. - Shallow disc protrusion at T2-3 without significant neural compression. - No other disc protrusions, spinal or foraminal stenosis in the thoracic spine. - Normal MR appearance of the thoracic spinal cord.  09/21/16 MRI lumbar spine - No likely significant finding at L3-4 or above. - Old compression fracture at L5 which is healed. Posterior bowing of the posterosuperior margin of the vertebral body. Mild bulging of the L4-5 disc. In combination with the L5 bowing hand with bilateral facet and ligamentous hypertrophy, there is moderate stenosis at this level that could possibly cause neural compression. - L5-S1 facet arthropathy with gaping, fluid-filled facet joints. Mild bulging of the disc. Mild narrowing of the subarticular lateral recesses and neural foramina. This appearance would likely worsen with standing or flexion based on the appearance of the facet arthropathy.      ASSESSMENT AND PLAN  76 y.o. year old female here with  multiple episodes of recurrent unprovoked passing out attacks (> 50 since 2015) with syncope and seizure features. Considerations would include beta blocker induced bradycardia and syncopal attacks. Other consideration would include complex partial seizures. The multiple attacks, stereotyped, sometimes preceded by a moan or scream, sometimes preceded by staring spells are highly suspicious for complex partial seizures.   Patient's gait and balance difficulties may be due to underlying cervical myelopathy and we will check MRI cervical spine for further evaluation.  Patient also has long-standing history of probable migraine headaches. 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  Now with new left basal ganglia ischemic infarction (Dec 2017).   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     Ddx: seizure (complex partial) + accelerated cerebro-vascular disease + neurodegenerative disease (MSA, parkinson's plus)  1. Partial symptomatic epilepsy with complex partial seizures, not intractable, without status epilepticus ([place])   2. Gait difficulty   3. 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 Oxcarbazepine (TRILEPTAL) 300 MG tablet    Sig: Take 1 tablet (300 mg total) by mouth 2 (two) times daily.    Dispense:  180 tablet    Refill:  4  . divalproex (DEPAKOTE ER) 500 MG 24 hr tablet    Sig: Take 1 tablet (500 mg total) by mouth at bedtime.    Dispense:  90 tablet    Refill:  4   Return in about 4 months (around 05/26/2017).   Penni Bombard, MD 02/25/6802, 2:12 PM Certified in Neurology, Neurophysiology and Neuroimaging  [facility] [address] [place], [place] [address] [phone]

## 2017-01-28 ENCOUNTER — Telehealth: Payer: Self-pay | Admitting: Diagnostic Neuroimaging

## 2017-01-28 MED ORDER — OXCARBAZEPINE 300 MG PO TABS: 300.0000 mg | ORAL_TABLET | Freq: Two times a day (BID) | ORAL | 4 refills | Status: AC

## 2017-01-28 NOTE — Addendum Note (Signed)
Addended by: Minna Antis on: 01/28/2017 08:42 AM   Modules accepted: Orders

## 2017-01-28 NOTE — Telephone Encounter (Signed)
Pt daughter calling because  [place], [place] [phone] (Phone) [phone] (Fax)    has not received the order for  Oxcarbazepine (TRILEPTAL) 300 MG tablet  Please resend

## 2017-05-28 ENCOUNTER — Encounter: Payer: Self-pay | Admitting: Diagnostic Neuroimaging

## 2017-05-28 ENCOUNTER — Ambulatory Visit (INDEPENDENT_AMBULATORY_CARE_PROVIDER_SITE_OTHER): Payer: Medicare Other | Admitting: Diagnostic Neuroimaging

## 2017-05-28 VITALS — BP 169/87 | HR 76 | Ht <= 58 in

## 2017-05-28 DIAGNOSIS — G40209 Localization-related (focal) (partial) symptomatic epilepsy and epileptic syndromes with complex partial seizures, not intractable, without status epilepticus: Secondary | ICD-10-CM | POA: Diagnosis not present

## 2017-05-28 DIAGNOSIS — I63312 Cerebral infarction due to thrombosis of left middle cerebral artery: Secondary | ICD-10-CM | POA: Diagnosis not present

## 2017-05-28 DIAGNOSIS — R269 Unspecified abnormalities of gait and mobility: Secondary | ICD-10-CM | POA: Diagnosis not present

## 2017-05-28 DIAGNOSIS — R413 Other amnesia: Secondary | ICD-10-CM | POA: Diagnosis not present

## 2017-05-28 NOTE — Progress Notes (Signed)
[facility]  PATIENT: Savannah Travis DOB: 16-Jan-1941  REFERRING CLINICIAN: [place] Police HISTORY FROM: patient, husband, daughter  REASON FOR VISIT: follow up   HISTORICAL  CHIEF COMPLAINT:  Chief Complaint  Patient presents with  . Follow-up  . Seizures    HISTORY OF PRESENT ILLNESS:   UPDATE (05/28/17, VRP): Since last visit, doing about the same. 2 staring spells in last 30 days. Tolerating OXC and VPA. No alleviating or aggravating factors. Some continued decline of memory and cognitive abilities.   UPDATE 01/23/17: Since last visit, doing well. Not many staring spells. Tolerating meds. Mainly in the wheelchair. Some intermittent headaches.  UPDATE 10/24/16: Since last visit patient forcefully has had several new issues. Patient continues to have intermittent staring spells, possible seizures in spite of starting medication. Patient went to emergency room on 09/11/16 for gait and balance difficulty. She was evaluated there and set up with follow-up in outpatient clinics and recommendation for home health therapy and service evaluation. Patient then returned to emergency room on 09/20/16 for altered mental status and unresponsiveness. Patient had similar unresponsive episode is her prior seizures but was more severe. She then developed slurred speech and generalized weakness. Therefore patient was brought to hospital for further evaluation. Patient was found to have an acute left basal ganglia ischemic infarction. Patient was admitted and had stroke workup. Patient then went to inpatient rehabilitation. Medications were adjusted and she was discharged home on 10/11/16.  Since then patient still having intermittent staring spells. No generalized convulsive seizures.   Patient also having more problems with bladder incontinence. Apparently urologist would like to try some type of medication or treatment, but is reluctant to start this because they would like to have  input from neurology standpoint.   Patient having intermittent headaches and sharp shooting pains. Patient taking Tylenol for headaches.  Patient also having left hand numbness and pain. However this occurred since July 2017 when she fell and fractured her left wrist, requiring surgery. Following surgery patient had increasing numbness and pain in her left wrist. Patient referred to me today from orthopedic surgery for evaluation of possible left carpal tunnel syndrome.   PRIOR HPI (07/24/16): 76 year old right-handed female here for evaluation of constellation of symptoms including head pain, squeezing pressure headaches, passing out spells. History of migraine, depression, anxiety, hypertension, hypercholesteremia, breast cancer.  In January 2015 patient had episode of falling backwards, hitting her head and passing out. Unclear if patient passed out and fell down or she tripped, fell and then passed out. Patient woke up with pain in the back of her head. She did not seek medical attention.   Since that time patient has had over 50 episodes of unprovoked syncope and passing out. Sometimes these are proceeded by patient making the sound "oh oh". Patient has had episodes of staring spells as well. With some of these episodes patient had witnessed/documented low heart rate in the 40s. Blood pressure during these attacks when it had been checked was normal to slightly elevated.   Patient has had evaluation by PCP, hospital admission in September 2017, cardiology consultation, without specific etiology found. Patient had metoprolol medication at the time which was discontinued as patient has some episodes of bradycardia. Patient had neurology evaluation and has been treated empirically for seizures with oxcarbazepine. Since that time no further syncope or seizure attacks.  In addition patient has intermittent "zinger" pain in her head lasting for 1 second at a time every other day. Sometimes this happens  once every 2 weeks. Patient was diagnosed with possible occipital neuralgia, treated with occipital nerve block without relief. Patient was also tried on gabapentin without relief.  Patient also has intermittent "gripping pressure" squeezing pain and pressure in her head lasting 2-3 minutes at a time, 4 days out of the week, 2-3 times per day.  Patient has history of headaches since teenage years consisting of intense global severe pain associated with menstrual cycle. Sometimes stress or exams would also seem to trigger headaches. Patient took over-the-counter medications. No nausea, vomiting, sensitivity to light or sound.   REVIEW OF SYSTEMS: Full 14 system review of systems performed and negative with exception of:  Memory loss speech diff weakness confusion depression anxiety.   ALLERGIES: Allergies  Allergen Reactions  . Demerol [Meperidine] Nausea And Vomiting  . Integra [Fe Fum-Fepoly-Vit C-Vit B3] Diarrhea  . Magnesium Oxide Diarrhea  . Metformin And Related Diarrhea  . Sulfonylureas Swelling and Rash    HOME MEDICATIONS: Outpatient Medications Prior to Visit  Medication Sig Dispense Refill  . allopurinol (ZYLOPRIM) 300 MG tablet Take 1 tablet (300 mg total) by mouth every morning. 30 tablet 1  . aspirin EC 325 MG EC tablet Take 1 tablet (325 mg total) by mouth daily. 30 tablet 0  . atorvastatin (LIPITOR) 40 MG tablet Take 1 tablet (40 mg total) by mouth at bedtime. 30 tablet 1  . butalbital-acetaminophen-caffeine (FIORICET, ESGIC) 50-325-40 MG tablet Take 1 tablet by mouth every 6 (six) hours as needed for headache. 30 tablet 1  . calcium-vitamin D (CALCIUM 500/D) 500-200 MG-UNIT tablet Take 1 tablet by mouth at bedtime.     . clonazePAM (KLONOPIN) 1 MG tablet Take 1 tablet (1 mg total) by mouth at bedtime as needed for anxiety. 14 tablet 0  . clopidogrel (PLAVIX) 75 MG tablet Take 1 tablet (75 mg total) by mouth daily. 30 tablet 0  . Coenzyme Q10 (COQ10) 200 MG CAPS Take 200  mg by mouth 2 (two) times daily.    . diclofenac sodium (VOLTAREN) 1 % GEL Apply 2 g topically 4 (four) times daily. 1 Tube 1  . divalproex (DEPAKOTE ER) 500 MG 24 hr tablet Take 1 tablet (500 mg total) by mouth at bedtime. 90 tablet 4  . escitalopram (LEXAPRO) 10 MG tablet Take 10 mg by mouth daily.    . Glucos-Chond-Hyal Ac-Ca Fructo (MOVE FREE JOINT HEALTH ADVANCE PO) Take 1 tablet by mouth every morning.     . maprotiline (LUDIOMIL) 50 MG tablet Take 1 tablet (50 mg total) by mouth at bedtime. 30 tablet 1  . meloxicam (MOBIC) 15 MG tablet 15 mg daily.    . montelukast (SINGULAIR) 10 MG tablet Take 10 mg by mouth daily as needed (for SOB).     Marland Kitchen omeprazole (PRILOSEC) 10 MG capsule Take 10 mg by mouth daily.    . Oxcarbazepine (TRILEPTAL) 300 MG tablet Take 1 tablet (300 mg total) by mouth 2 (two) times daily. 180 tablet 4  . Pancrelipase, Lip-Prot-Amyl, 5000 units CPEP Take 5,000-10,000 Units by mouth daily as needed (diarrhea).     . albuterol (PROVENTIL) (2.5 MG/3ML) 0.083% nebulizer solution as needed.  12  . amLODipine (NORVASC) 5 MG tablet Take 1 tablet (5 mg total) by mouth every morning. (Patient not taking: Reported on 05/28/2017) 30 tablet 1  . fesoterodine (TOVIAZ) 8 MG TB24 tablet Take 8 mg by mouth daily.     No facility-administered medications prior to visit.     PAST MEDICAL HISTORY: Past  Medical History:  Diagnosis Date  . Cancer (HCC)    breast  . Headache    migraines  . Hypertension   . S/P wrist surgery 2015, 2017   for fx  . Seizures ([place])   . Stroke [facility]) 08/2016    PAST SURGICAL HISTORY: Past Surgical History:  Procedure Laterality Date  . BREAST SURGERY Right 2012   lumps removed  . OVARIAN CYST SURGERY  1962    FAMILY HISTORY: Family History  Problem Relation Age of Onset  . Stroke Father   . CAD Father   . Cancer Sister   . Breast cancer Sister   . Colon cancer Sister   . Stroke Other   . Rheum arthritis Neg Hx     SOCIAL  HISTORY:  Social History   Social History  . Marital status: Married    Spouse name: Jeneen Rinks  . Number of children: 2  . Years of education: 12   Occupational History  .      [employer], retired   Social History Main Topics  . Smoking status: Never Smoker  . Smokeless tobacco: Never Used  . Alcohol use No  . Drug use: No  . Sexual activity: Not on file   Other Topics Concern  . Not on file   Social History Narrative   Lives with husband, caregiver daily   Caffeine - coffee, 1 cup daily     PHYSICAL EXAM  GENERAL EXAM/CONSTITUTIONAL: Vitals:  Vitals:   05/28/17 1428  BP: (!) 169/87  Pulse: 76  Height: 4\' 8"  (1.422 m)   There is no height or weight on file to calculate BMI. No exam data present  Patient is in no distress; well developed, nourished and groomed; neck is supple  CARDIOVASCULAR:  Examination of carotid arteries is normal; no carotid bruits  Regular rate and rhythm, no murmurs  Examination of peripheral vascular system by observation and palpation is normal  EYES:  Ophthalmoscopic exam of optic discs and posterior segments is normal; no papilledema or hemorrhages  MUSCULOSKELETAL:  Gait, strength, tone, movements noted in Neurologic exam below  NEUROLOGIC: MENTAL STATUS:  No flowsheet data found.  awake, alert, oriented to person, place; "Sept 1, 1920"  [facility] memory  [facility] attention and concentration  DECR FLUENCY; comprehension intact, naming intact,   fund of knowledge appropriate  CRANIAL NERVE:   2nd - no papilledema on fundoscopic exam  2nd, 3rd, 4th, 6th - pupils equal and reactive to light, visual fields full to confrontation, extraocular muscles intact, no nystagmus  5th - facial sensation symmetric  7th - facial strength symmetric  8th - hearing intact  9th - palate elevates symmetrically, uvula midline  11th - shoulder shrug symmetric  12th - tongue protrusion midline  MILD SLURRED SPEECH  MOTOR:    normal bulk; SLIGHTLY INCREASED TONE IN LOWER EXT  BUE 4  BLE 2-3   SENSORY:   normal and symmetric to light touch, temperature, vibration  DECR ON RIGHT SIDE  COORDINATION:   finger-nose-finger, fine finger movements SLOW   REFLEXES:   deep tendon reflexes BRISK IN UPPER EXT AND KNEES and symmetric  GAIT/STATION:   IN WHEEL CHAIR; CANNOT STAND UNASSISTED    DIAGNOSTIC DATA (LABS, IMAGING, TESTING) - I reviewed patient records, labs, notes, testing and imaging myself where available.  Lab Results  Component Value Date   WBC 5.7 10/08/2016   HGB 11.4 (L) 10/08/2016   HCT 36.6 10/08/2016   MCV 90.1 10/08/2016  PLT 277 10/08/2016      Component Value Date/Time   NA 139 10/08/2016 0500   K 3.8 10/08/2016 0500   CL 107 10/08/2016 0500   CO2 24 10/08/2016 0500   GLUCOSE 92 10/08/2016 0500   BUN 23 (H) 10/08/2016 0500   CREATININE 1.15 (H) 10/08/2016 0500   CALCIUM 8.9 10/08/2016 0500   PROT 7.0 09/26/2016 1018   ALBUMIN 3.9 09/26/2016 1018   AST 32 09/26/2016 1018   ALT 20 09/26/2016 1018   ALKPHOS 90 09/26/2016 1018   BILITOT 0.9 09/26/2016 1018   GFRNONAA 45 (L) 10/08/2016 0500   GFRAA 53 (L) 10/08/2016 0500   Lab Results  Component Value Date   CHOL 156 09/22/2016   HDL 47 09/22/2016   LDLCALC 87 09/22/2016   TRIG 111 09/22/2016   CHOLHDL 3.3 09/22/2016   Lab Results  Component Value Date   HGBA1C 5.6 09/22/2016   No results found for: VITAMINB12 No results found for: TSH  06/05/16 EEG - normal EEG in awake and drowsy states  09/21/16 MRI brain [I reviewed images myself and agree with interpretation. -VRP]  1. Acute infarct in the central left corona radiata, nonhemorrhagic. 2. Focal high-grade left M1 segment stenosis. Elsewhere negative intracranial MRA. 3. 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  09/21/16 MRA head  1. Acute infarct in the central left corona radiata, nonhemorrhagic. 2.  Focal high-grade left M1 segment stenosis. Elsewhere negative intracranial MRA. 3. 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   09/21/16 MRI cervical spine - No acute or likely significant finding in the cervical region. No change since the study of November. Degenerative facet arthropathy left worse than right. Mild disc degeneration. No compressive stenosis of the canal or foramina. No primary cord lesion.  09/21/16 MRI thoracic  - Thoracic scoliosis. - Shallow disc protrusion at T2-3 without significant neural compression. - No other disc protrusions, spinal or foraminal stenosis in the thoracic spine. - Normal MR appearance of the thoracic spinal cord.  09/21/16 MRI lumbar spine - No likely significant finding at L3-4 or above. - Old compression fracture at L5 which is healed. Posterior bowing of the posterosuperior margin of the vertebral body. Mild bulging of the L4-5 disc. In combination with the L5 bowing hand with bilateral facet and ligamentous hypertrophy, there is moderate stenosis at this level that could possibly cause neural compression. - L5-S1 facet arthropathy with gaping, fluid-filled facet joints. Mild bulging of the disc. Mild narrowing of the subarticular lateral recesses and neural foramina. This appearance would likely worsen with standing or flexion based on the appearance of the facet arthropathy.      ASSESSMENT AND PLAN  76 y.o. year old female here with  multiple episodes of recurrent unprovoked passing out attacks (> 50 since 2015) with syncope and seizure features. Considerations would include beta blocker induced bradycardia and syncopal attacks. Other consideration would include complex partial seizures. The multiple attacks, stereotyped, sometimes preceded by a moan or scream, sometimes preceded by staring spells are highly suspicious for complex partial seizures.   Patient's gait and balance difficulties may be  due to underlying cervical myelopathy and we will check MRI cervical spine for further evaluation.  Patient also has long-standing history of probable migraine headaches. 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  Now with new left basal ganglia ischemic infarction (Dec 2017).   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     Ddx: seizure (complex partial) + accelerated cerebro-vascular disease + neurodegenerative disease (MSA, parkinson's plus)  1. Partial symptomatic epilepsy with complex partial seizures, not intractable, without status epilepticus ([place])   2. Cerebrovascular accident (CVA) due to thrombosis of left middle cerebral artery (HCC)   3. Gait difficulty   4. 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   Penni Bombard, MD 12/28/9975, 4:14 PM Certified in Neurology, Neurophysiology and Neuroimaging  [facility] [address] [place], [place] [address] [phone]

## 2017-05-28 NOTE — Patient Instructions (Signed)
-   stop aspirin  - continue plavix 75mg  daily  - continue oxcarbazepine and depakote

## 2017-06-22 ENCOUNTER — Other Ambulatory Visit: Payer: Self-pay | Admitting: Diagnostic Neuroimaging

## 2018-01-22 DEATH — deceased

## 2018-04-06 IMAGING — DX DG CHEST 1V
1 series · 1 of 1 positions shown · non-contrast
Comparison: None.

CLINICAL DATA: Weakness, "in a fog", no energy, and sleeping more x
[REDACTED]

EXAM:
CHEST 1 VIEW

[chest ap]
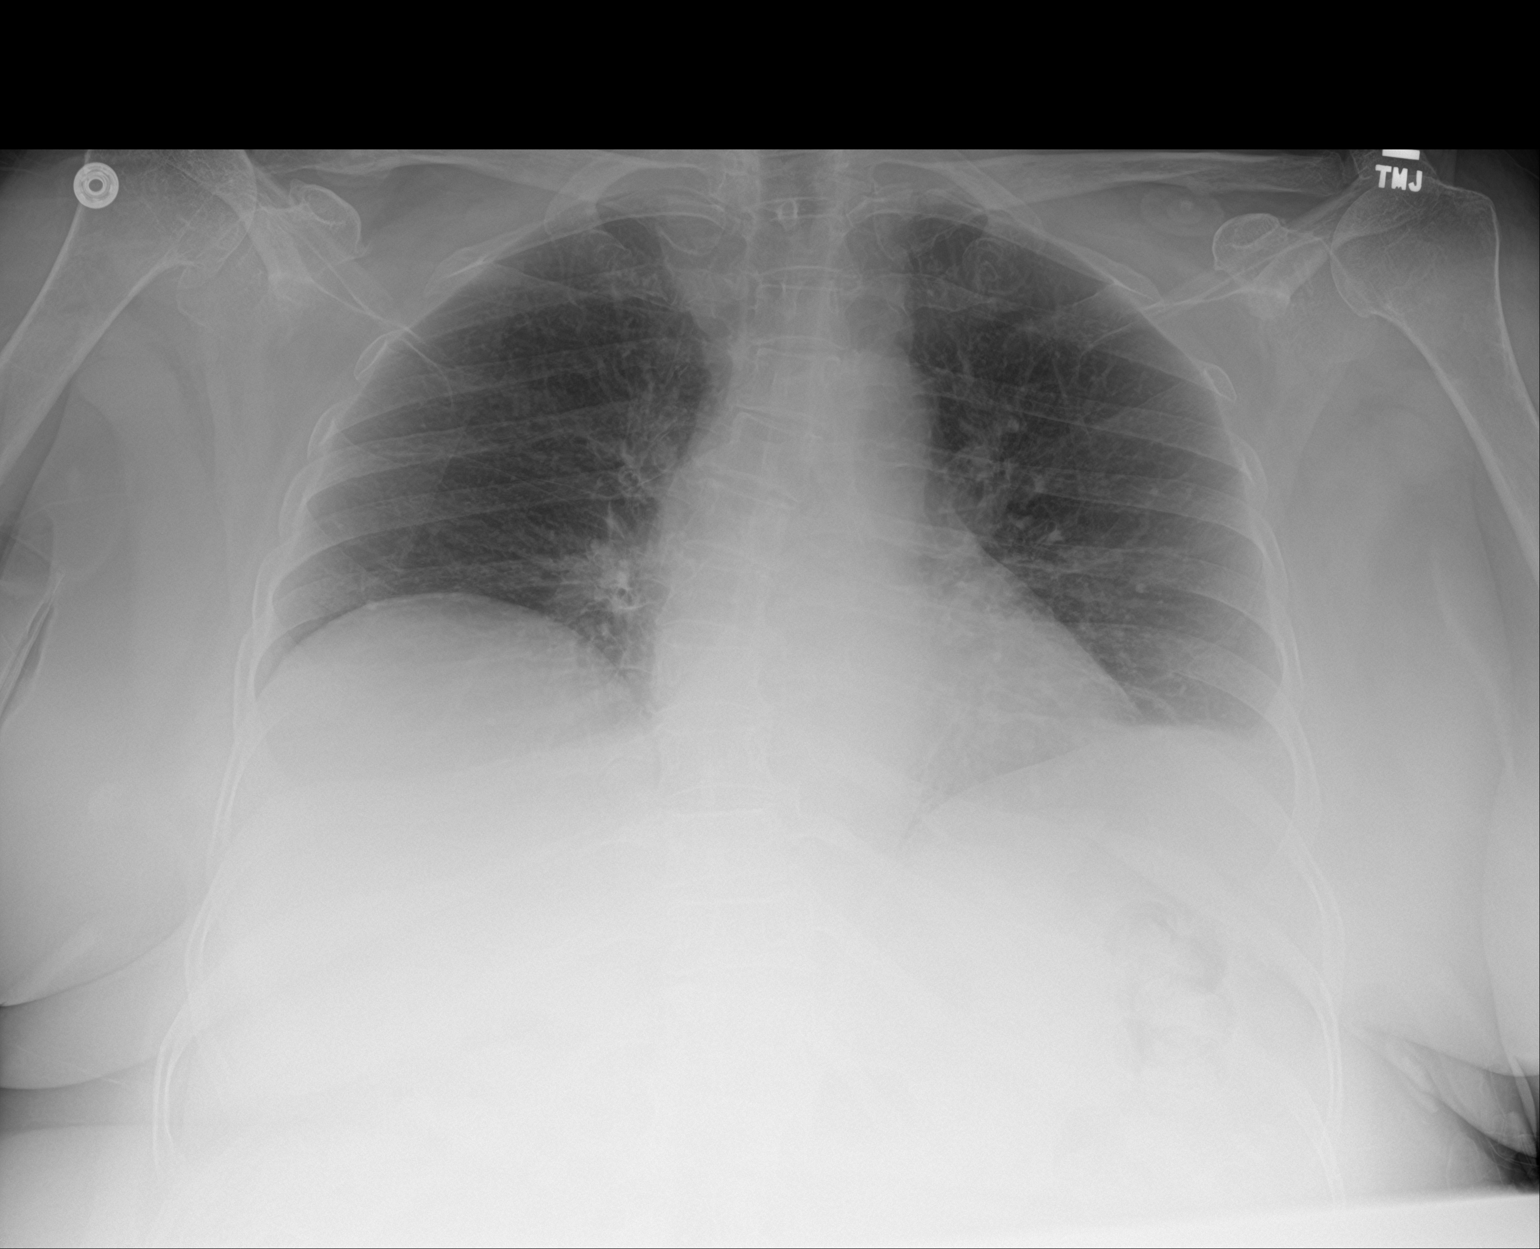

[1 of 1 positions shown; findings below may reference images not displayed]

FINDINGS: Normal mediastinum and cardiac silhouette. Normal pulmonary
vasculature. No evidence of effusion, infiltrate, or pneumothorax.
No acute bony abnormality.
IMPRESSION: No acute cardiopulmonary process.

## 2018-04-15 IMAGING — CR DG CHEST 2V
2 series · 2 of 2 positions shown · non-contrast
Comparison: 09/11/2016

CLINICAL DATA: Transient ischemic attack.

EXAM:
CHEST  2 VIEW

[chest lat]
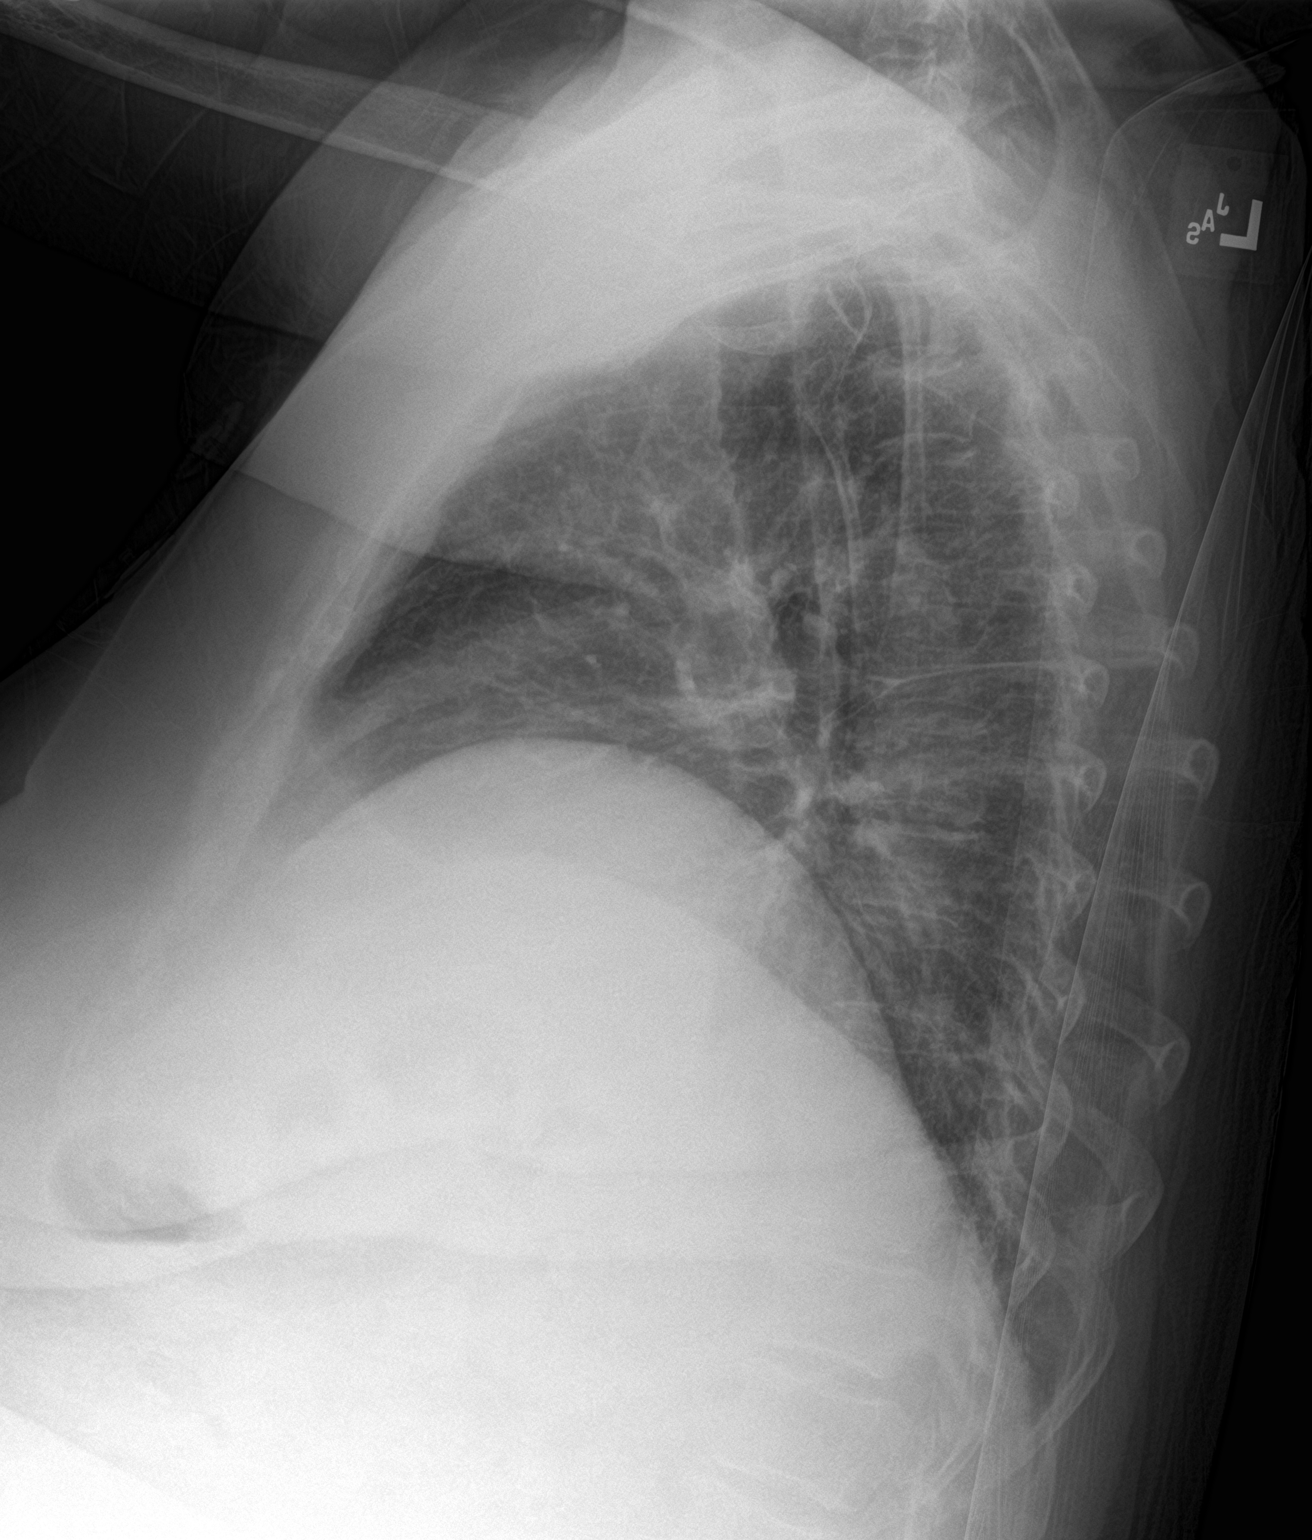

[chest ap]
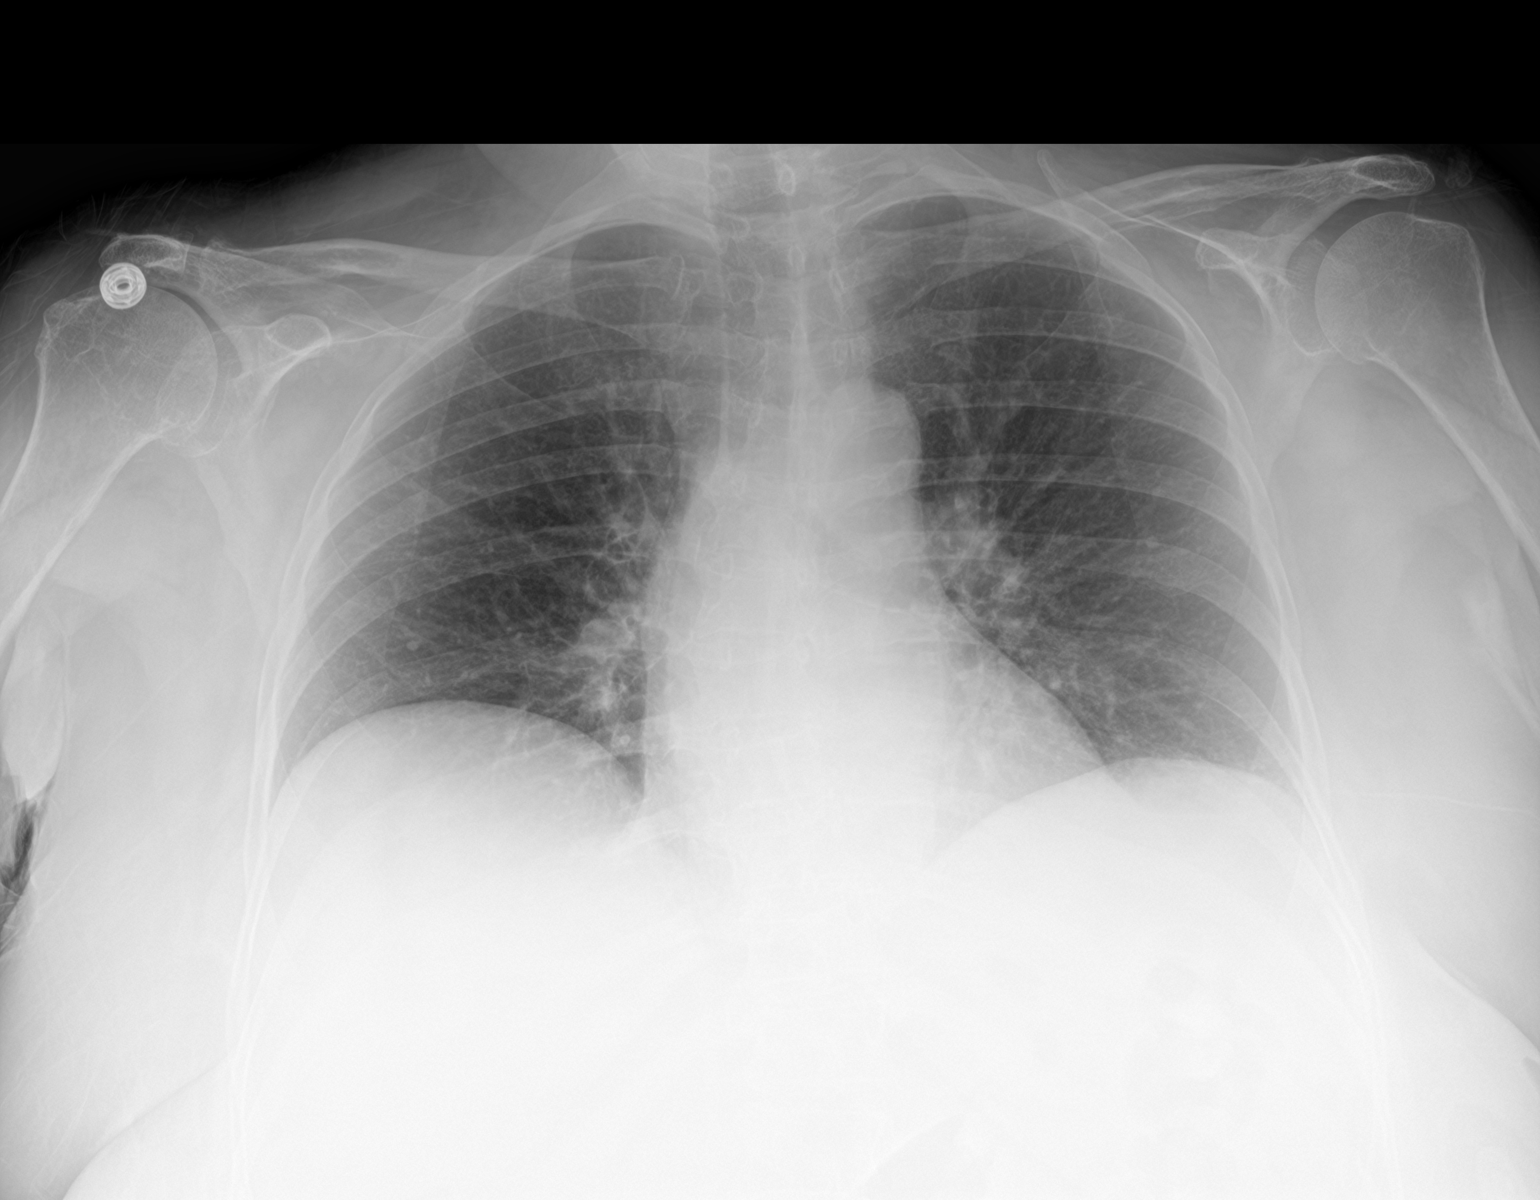

[2 of 2 positions shown; findings below may reference images not displayed]

FINDINGS: The heart size and mediastinal contours are within normal limits.
Both lungs are clear. Minimal atelectasis at the left lung base. The
visualized skeletal structures are unremarkable.
IMPRESSION: No active cardiopulmonary disease.

## 2018-04-18 IMAGING — MR MR THORACIC SPINE W/O CM
4 of 7 series · 19 of 48 positions shown · non-contrast
Comparison: Cervical and lumbar spine MRI examinations from
yesterday.

CLINICAL DATA: Weakness and inability to ambulate.

EXAM:
MRI THORACIC SPINE WITHOUT CONTRAST
TECHNIQUE: Multiplanar, multisequence MR imaging of the thoracic spine was
performed. No intravenous contrast was administered.

[Series 4: T2 · sagittal · 3.0mm · 0.64mm/px · 5 of 16 slices shown (1 of 2)]
[im 1/16]
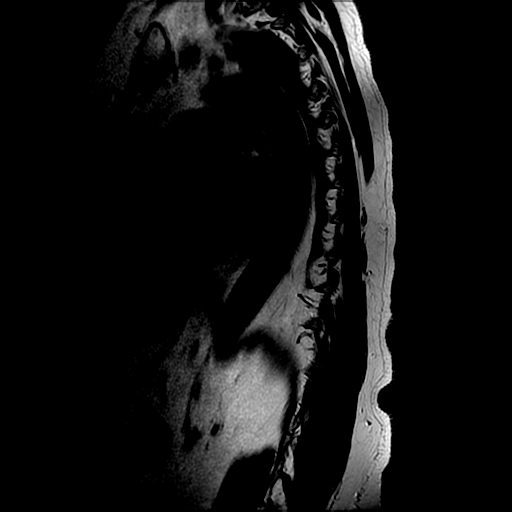
[im 4/16]
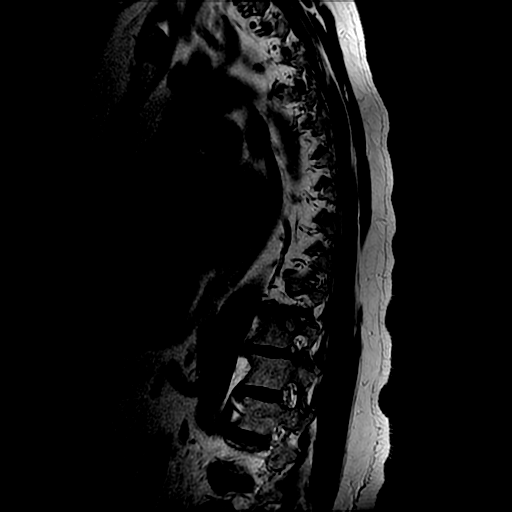
[im 8/16]
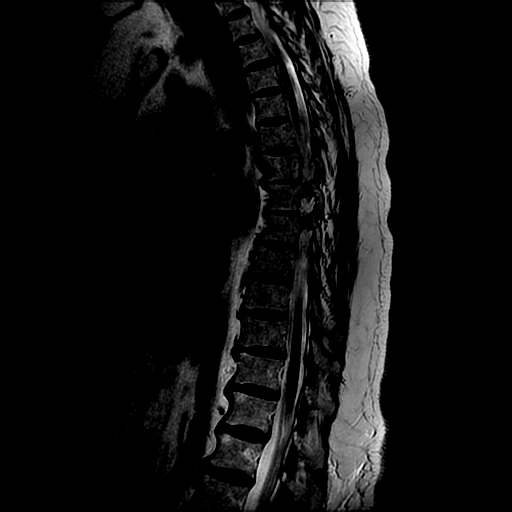
[im 12/16]
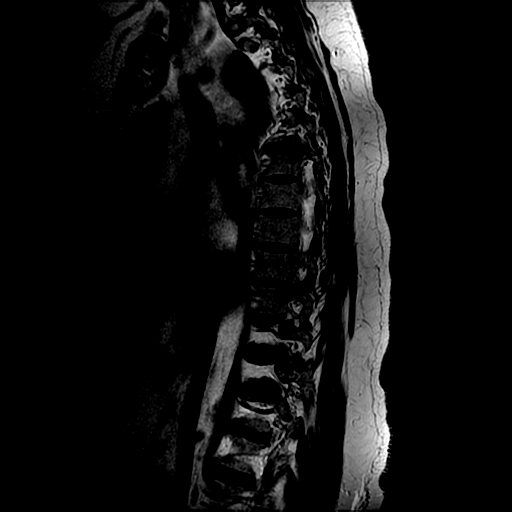
[im 16/16]
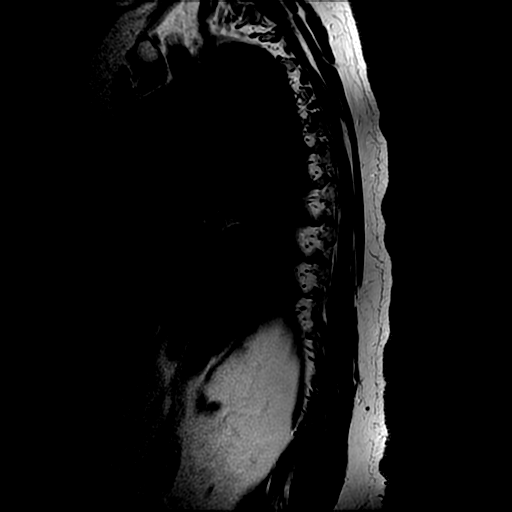

[Series 5: T1 · sagittal · 3.0mm · 0.64mm/px · 3 of 16 slices shown (1 of 2)]
[im 1/16]
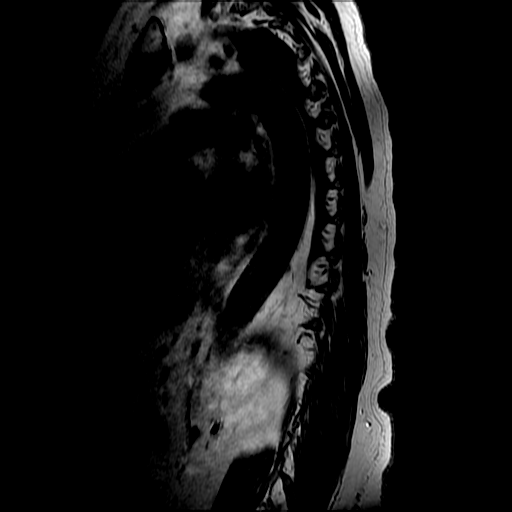
[im 11/16]
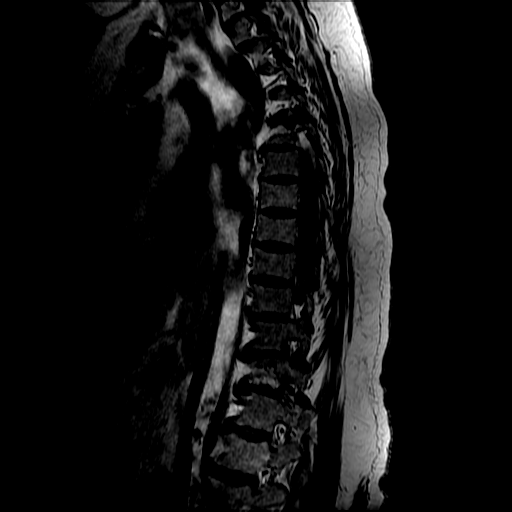
[im 16/16]
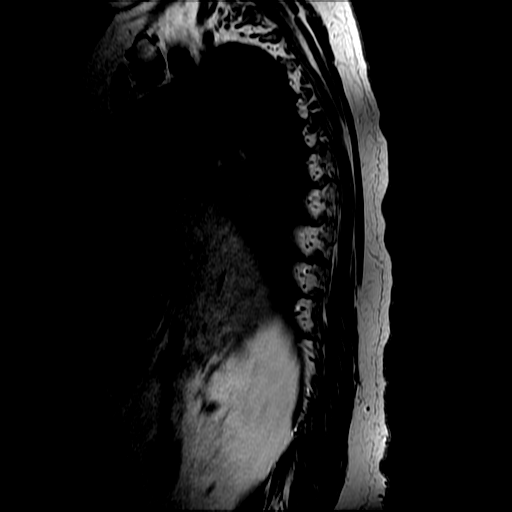

[Series 7: T2 · axial · 4.0mm · 0.43mm/px · z∈[-276,-52]mm · 8 of 39 slices shown (2 of 2)]
[im 1/39]
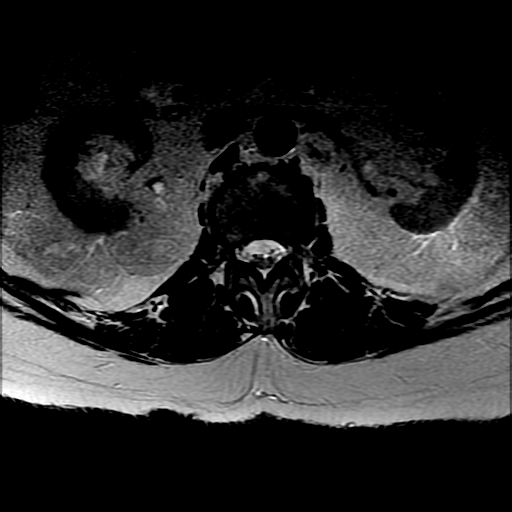
[im 5/39]
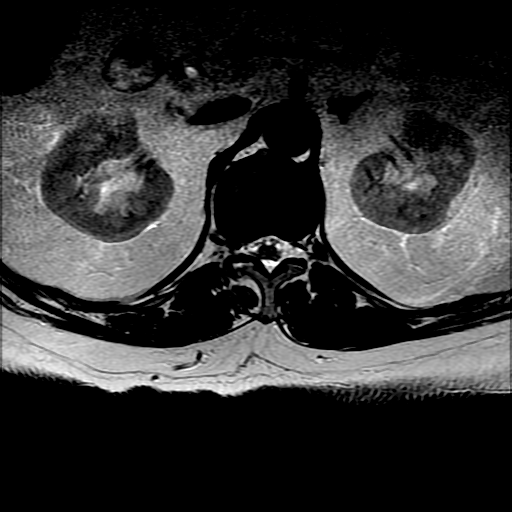
[im 13/39]
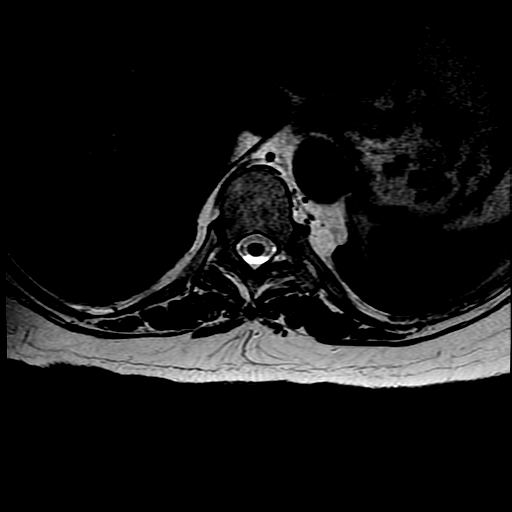
[im 17/39]
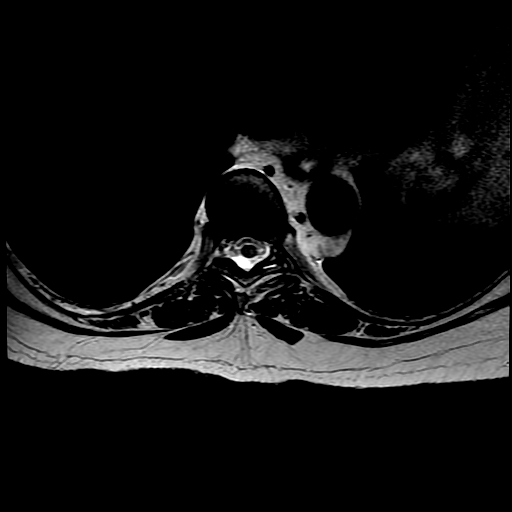
[im 22/39]
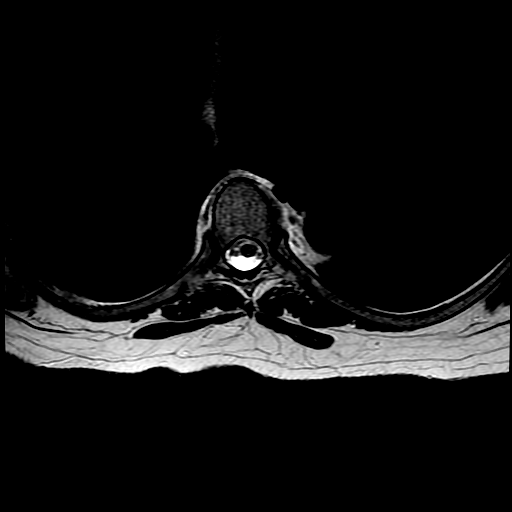
[im 26/39]
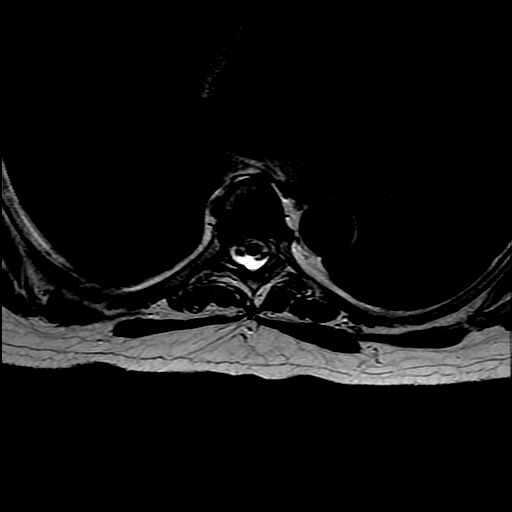
[im 34/39]
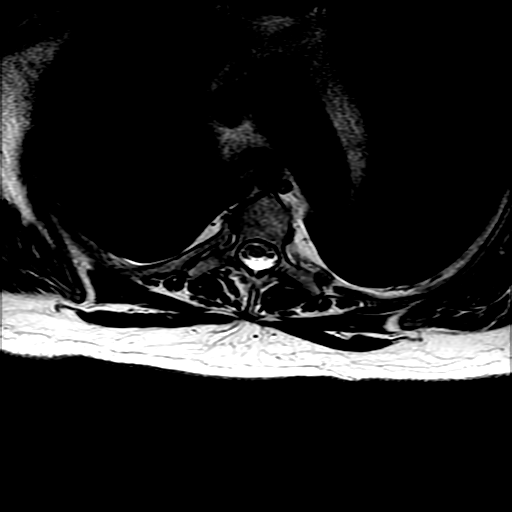
[im 39/39]
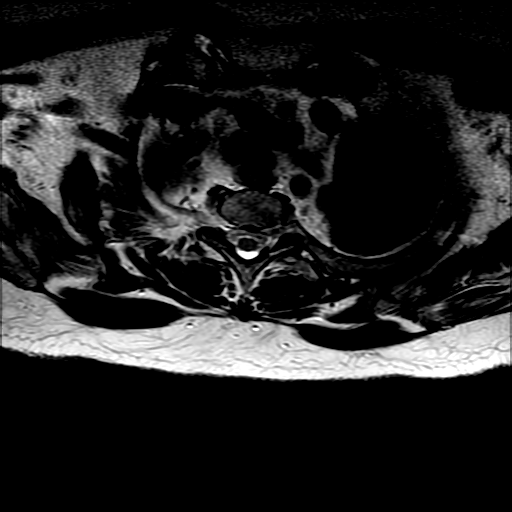

[Series 9: T1 · axial · 4.0mm · 0.43mm/px · z∈[-244,-79]mm · 3 of 39 slices shown (2 of 2)]
[im 5/39]
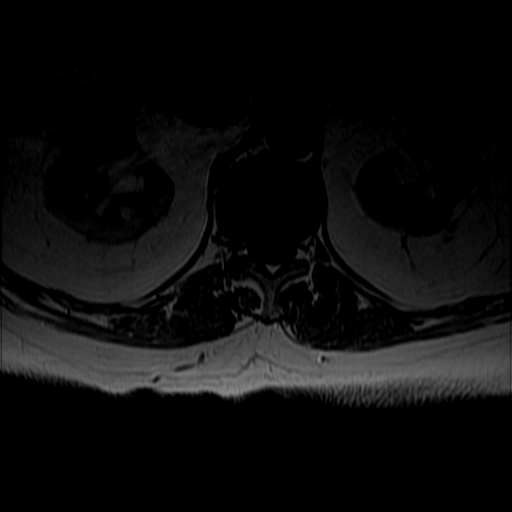
[im 22/39]
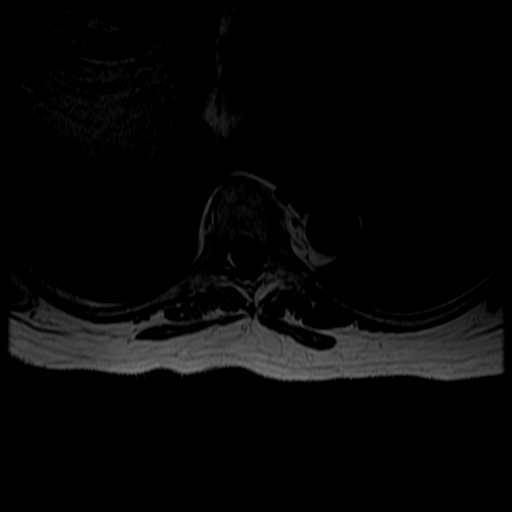
[im 34/39]
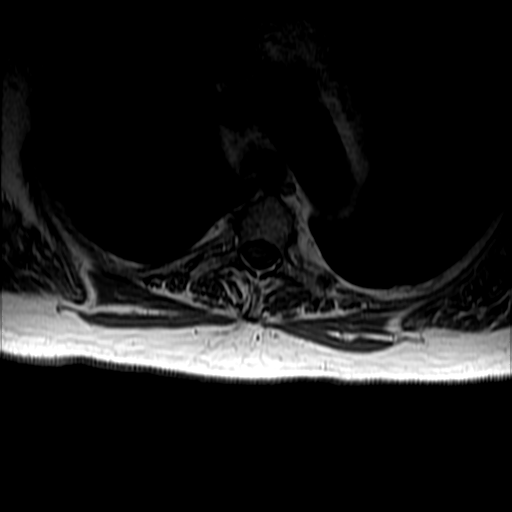

[19 of 48 positions shown; findings below may reference images not displayed]

FINDINGS: Alignment:  Scoliosis but normal alignment in the sagittal plane.

Vertebrae: Normal marrow signal except for a few small scattered
hemangiomas. Mild endplate reactive changes are noted at T12-L1.

Cord: Normal signal intensity. No cord lesions or syrinx. The conus
medullaris is normal.

Paraspinal and other soft tissues: No significant findings.

Disc levels:

Shallow disc protrusion at T2-3 with mild flattening of the ventral
thecal sac.

No other significant disc protrusions, spinal or foraminal stenosis
involving the thoracic spine. Minimal disc bulges at T11-12 and
T12-L1.
IMPRESSION: Thoracic scoliosis.

Shallow disc protrusion at T2-3 without significant neural
compression.

No other disc protrusions, spinal or foraminal stenosis in the
thoracic spine.

Normal MR appearance of the thoracic spinal cord.
